# Patient Record
Sex: Female | Born: 1935 | Race: White | Hispanic: No | Smoking: Former smoker
Health system: Southern US, Community
[De-identification: ages and names within clinical notes are randomized; demographics above are authoritative.]

## PROBLEM LIST (undated history)

## (undated) DIAGNOSIS — H04129 Dry eye syndrome of unspecified lacrimal gland: Secondary | ICD-10-CM

## (undated) DIAGNOSIS — D649 Anemia, unspecified: Secondary | ICD-10-CM

## (undated) DIAGNOSIS — E785 Hyperlipidemia, unspecified: Secondary | ICD-10-CM

## (undated) DIAGNOSIS — H409 Unspecified glaucoma: Secondary | ICD-10-CM

## (undated) DIAGNOSIS — H3530 Unspecified macular degeneration: Secondary | ICD-10-CM

## (undated) DIAGNOSIS — I639 Cerebral infarction, unspecified: Secondary | ICD-10-CM

## (undated) DIAGNOSIS — K625 Hemorrhage of anus and rectum: Secondary | ICD-10-CM

## (undated) DIAGNOSIS — H4010X0 Unspecified open-angle glaucoma, stage unspecified: Secondary | ICD-10-CM

## (undated) DIAGNOSIS — R269 Unspecified abnormalities of gait and mobility: Secondary | ICD-10-CM

## (undated) DIAGNOSIS — F419 Anxiety disorder, unspecified: Secondary | ICD-10-CM

## (undated) DIAGNOSIS — H8110 Benign paroxysmal vertigo, unspecified ear: Secondary | ICD-10-CM

## (undated) DIAGNOSIS — M810 Age-related osteoporosis without current pathological fracture: Secondary | ICD-10-CM

## (undated) DIAGNOSIS — F0390 Unspecified dementia without behavioral disturbance: Secondary | ICD-10-CM

## (undated) DIAGNOSIS — M542 Cervicalgia: Secondary | ICD-10-CM

## (undated) DIAGNOSIS — S81801A Unspecified open wound, right lower leg, initial encounter: Secondary | ICD-10-CM

## (undated) DIAGNOSIS — M545 Low back pain, unspecified: Secondary | ICD-10-CM

## (undated) DIAGNOSIS — I4891 Unspecified atrial fibrillation: Secondary | ICD-10-CM

## (undated) DIAGNOSIS — E039 Hypothyroidism, unspecified: Secondary | ICD-10-CM

## (undated) DIAGNOSIS — R197 Diarrhea, unspecified: Secondary | ICD-10-CM

## (undated) DIAGNOSIS — I6310 Cerebral infarction due to embolism of unspecified precerebral artery: Secondary | ICD-10-CM

## (undated) DIAGNOSIS — R1310 Dysphagia, unspecified: Secondary | ICD-10-CM

## (undated) DIAGNOSIS — I482 Chronic atrial fibrillation, unspecified: Secondary | ICD-10-CM

## (undated) DIAGNOSIS — J189 Pneumonia, unspecified organism: Secondary | ICD-10-CM

## (undated) DIAGNOSIS — F411 Generalized anxiety disorder: Secondary | ICD-10-CM

## (undated) DIAGNOSIS — Z7901 Long term (current) use of anticoagulants: Secondary | ICD-10-CM

## (undated) DIAGNOSIS — I10 Essential (primary) hypertension: Secondary | ICD-10-CM

## (undated) DIAGNOSIS — I872 Venous insufficiency (chronic) (peripheral): Secondary | ICD-10-CM

## (undated) DIAGNOSIS — E559 Vitamin D deficiency, unspecified: Secondary | ICD-10-CM

## (undated) DIAGNOSIS — M1990 Unspecified osteoarthritis, unspecified site: Secondary | ICD-10-CM

## (undated) DIAGNOSIS — N8111 Cystocele, midline: Secondary | ICD-10-CM

## (undated) DIAGNOSIS — E782 Mixed hyperlipidemia: Secondary | ICD-10-CM

## (undated) DIAGNOSIS — D500 Iron deficiency anemia secondary to blood loss (chronic): Secondary | ICD-10-CM

## (undated) DIAGNOSIS — Z9181 History of falling: Principal | ICD-10-CM

## (undated) DIAGNOSIS — S20219A Contusion of unspecified front wall of thorax, initial encounter: Secondary | ICD-10-CM

## (undated) DIAGNOSIS — R5381 Other malaise: Secondary | ICD-10-CM

## (undated) HISTORY — DX: Vitamin D deficiency, unspecified: E55.9

## (undated) HISTORY — DX: Unspecified osteoarthritis, unspecified site: M19.90

## (undated) HISTORY — DX: Unspecified macular degeneration: H35.30

## (undated) HISTORY — DX: Generalized anxiety disorder: F41.1

## (undated) HISTORY — DX: Anemia, unspecified: D64.9

## (undated) HISTORY — PX: UMBILICAL HERNIA REPAIR: SHX196

## (undated) HISTORY — PX: SPINE SURGERY: SHX786

## (undated) HISTORY — DX: Dry eye syndrome of unspecified lacrimal gland: H04.129

## (undated) HISTORY — DX: Hypothyroidism, unspecified: E03.9

## (undated) HISTORY — DX: Unspecified dementia, unspecified severity, without behavioral disturbance, psychotic disturbance, mood disturbance, and anxiety: F03.90

## (undated) HISTORY — DX: Pneumonia, unspecified organism: J18.9

## (undated) HISTORY — DX: Age-related osteoporosis without current pathological fracture: M81.0

## (undated) HISTORY — DX: Unspecified glaucoma: H40.9

## (undated) HISTORY — DX: Venous insufficiency (chronic) (peripheral): I87.2

## (undated) HISTORY — DX: Hyperlipidemia, unspecified: E78.5

## (undated) HISTORY — DX: Unspecified atrial fibrillation: I48.91

## (undated) HISTORY — DX: Long term (current) use of anticoagulants: Z79.01

## (undated) HISTORY — DX: Other malaise: R53.81

## (undated) HISTORY — DX: Unspecified open wound, right lower leg, initial encounter: S81.801A

## (undated) HISTORY — DX: History of falling: Z91.81

## (undated) HISTORY — DX: Benign paroxysmal vertigo, unspecified ear: H81.10

## (undated) HISTORY — PX: TONSILLECTOMY AND ADENOIDECTOMY: SUR1326

## (undated) HISTORY — PX: CATARACT EXTRACTION W/ INTRAOCULAR LENS  IMPLANT, BILATERAL: SHX1307

## (undated) HISTORY — DX: Contusion of unspecified front wall of thorax, initial encounter: S20.219A

## (undated) HISTORY — DX: Cystocele, midline: N81.11

## (undated) HISTORY — DX: Cerebral infarction, unspecified: I63.9

---

## 1997-11-27 ENCOUNTER — Other Ambulatory Visit: Admission: RE | Admit: 1997-11-27 | Discharge: 1997-11-27 | Payer: Self-pay | Admitting: Internal Medicine

## 1998-11-19 ENCOUNTER — Encounter: Admission: RE | Admit: 1998-11-19 | Discharge: 1999-02-17 | Payer: Self-pay | Admitting: Anesthesiology

## 1999-04-21 ENCOUNTER — Encounter: Payer: Self-pay | Admitting: Neurosurgery

## 1999-04-24 ENCOUNTER — Encounter: Payer: Self-pay | Admitting: Neurosurgery

## 1999-04-24 ENCOUNTER — Observation Stay (HOSPITAL_COMMUNITY): Admission: RE | Admit: 1999-04-24 | Discharge: 1999-04-25 | Payer: Self-pay | Admitting: Neurosurgery

## 2002-05-19 ENCOUNTER — Ambulatory Visit (HOSPITAL_BASED_OUTPATIENT_CLINIC_OR_DEPARTMENT_OTHER): Admission: RE | Admit: 2002-05-19 | Discharge: 2002-05-19 | Payer: Self-pay | Admitting: Surgery

## 2002-09-25 ENCOUNTER — Encounter: Admission: RE | Admit: 2002-09-25 | Discharge: 2002-09-25 | Payer: Self-pay | Admitting: Surgery

## 2002-09-25 ENCOUNTER — Encounter: Payer: Self-pay | Admitting: Surgery

## 2002-09-28 ENCOUNTER — Ambulatory Visit (HOSPITAL_BASED_OUTPATIENT_CLINIC_OR_DEPARTMENT_OTHER): Admission: RE | Admit: 2002-09-28 | Discharge: 2002-09-28 | Payer: Self-pay | Admitting: Surgery

## 2003-07-11 ENCOUNTER — Encounter (INDEPENDENT_AMBULATORY_CARE_PROVIDER_SITE_OTHER): Payer: Self-pay

## 2003-07-11 ENCOUNTER — Ambulatory Visit (HOSPITAL_COMMUNITY): Admission: RE | Admit: 2003-07-11 | Discharge: 2003-07-11 | Payer: Self-pay | Admitting: Gastroenterology

## 2007-05-27 ENCOUNTER — Inpatient Hospital Stay (HOSPITAL_COMMUNITY): Admission: EM | Admit: 2007-05-27 | Discharge: 2007-05-29 | Payer: Self-pay | Admitting: Emergency Medicine

## 2007-08-11 DIAGNOSIS — I4891 Unspecified atrial fibrillation: Secondary | ICD-10-CM

## 2007-08-11 HISTORY — DX: Unspecified atrial fibrillation: I48.91

## 2008-02-21 ENCOUNTER — Emergency Department (HOSPITAL_COMMUNITY): Admission: EM | Admit: 2008-02-21 | Discharge: 2008-02-22 | Payer: Self-pay | Admitting: Emergency Medicine

## 2009-10-15 ENCOUNTER — Encounter: Admission: RE | Admit: 2009-10-15 | Discharge: 2009-10-15 | Payer: Self-pay | Admitting: Family Medicine

## 2010-02-05 ENCOUNTER — Ambulatory Visit: Payer: Self-pay | Admitting: Psychology

## 2010-09-25 ENCOUNTER — Ambulatory Visit: Payer: Self-pay | Admitting: Physical Therapy

## 2010-09-30 ENCOUNTER — Ambulatory Visit: Payer: Medicare PPO | Attending: Family Medicine | Admitting: Rehabilitative and Restorative Service Providers"

## 2010-09-30 DIAGNOSIS — IMO0001 Reserved for inherently not codable concepts without codable children: Secondary | ICD-10-CM | POA: Insufficient documentation

## 2010-09-30 DIAGNOSIS — M25519 Pain in unspecified shoulder: Secondary | ICD-10-CM | POA: Insufficient documentation

## 2010-09-30 DIAGNOSIS — M25619 Stiffness of unspecified shoulder, not elsewhere classified: Secondary | ICD-10-CM | POA: Insufficient documentation

## 2010-10-06 ENCOUNTER — Ambulatory Visit: Payer: Medicare PPO | Admitting: Physical Therapy

## 2010-10-08 ENCOUNTER — Ambulatory Visit: Payer: Medicare PPO | Admitting: Physical Therapy

## 2010-10-13 ENCOUNTER — Encounter: Payer: Medicare PPO | Admitting: Physical Therapy

## 2010-10-15 ENCOUNTER — Encounter: Payer: Self-pay | Admitting: Rehabilitative and Restorative Service Providers"

## 2010-10-20 ENCOUNTER — Encounter: Payer: Self-pay | Admitting: Rehabilitative and Restorative Service Providers"

## 2010-10-20 ENCOUNTER — Ambulatory Visit: Payer: Medicare PPO | Attending: Family Medicine | Admitting: Physical Therapy

## 2010-10-20 DIAGNOSIS — M25519 Pain in unspecified shoulder: Secondary | ICD-10-CM | POA: Insufficient documentation

## 2010-10-20 DIAGNOSIS — IMO0001 Reserved for inherently not codable concepts without codable children: Secondary | ICD-10-CM | POA: Insufficient documentation

## 2010-10-20 DIAGNOSIS — M25619 Stiffness of unspecified shoulder, not elsewhere classified: Secondary | ICD-10-CM | POA: Insufficient documentation

## 2010-10-22 ENCOUNTER — Ambulatory Visit: Payer: Medicare PPO | Admitting: Rehabilitative and Restorative Service Providers"

## 2010-10-27 ENCOUNTER — Other Ambulatory Visit: Payer: Self-pay | Admitting: Obstetrics and Gynecology

## 2010-10-27 DIAGNOSIS — N6311 Unspecified lump in the right breast, upper outer quadrant: Secondary | ICD-10-CM

## 2010-10-28 ENCOUNTER — Encounter: Payer: Medicare PPO | Admitting: Rehabilitative and Restorative Service Providers"

## 2010-10-29 ENCOUNTER — Ambulatory Visit: Payer: Medicare PPO | Admitting: Rehabilitative and Restorative Service Providers"

## 2010-10-31 ENCOUNTER — Other Ambulatory Visit: Payer: Medicare PPO

## 2010-11-05 ENCOUNTER — Encounter: Payer: Medicare PPO | Admitting: Rehabilitative and Restorative Service Providers"

## 2010-12-23 NOTE — Consult Note (Signed)
Barbara [name], Barbara [name]                 ACCOUNT NO.:  [id number]   MEDICAL RECORD NO.:  [id number]          PATIENT TYPE:  INP   LOCATION:  1825                         FACILITY:  [facility]   PHYSICIAN:  Jake Bathe, MD      DATE OF BIRTH:  03-11-1929   DATE OF CONSULTATION:  05/27/2007  DATE OF DISCHARGE:                                 CONSULTATION   REFERRING PHYSICIAN:  Tera Mater. Evlyn Kanner, M.D., taking call for [facility].   PRIMARY CARE PHYSICIAN:  Gaspar Garbe, M.D.   REASON FOR CONSULTATION:  Atrial fibrillation with rapid ventricular  rate.   HISTORY OF PRESENT ILLNESS:  Barbara [name] is a 77 year old female with no  prior cardiac history with a history of hyperlipidemia and  hypothyroidism on Synthroid, who was admitted to [facility] emergency  department with atrial fibrillation, rapid ventricular response.  She  noted profound weakness today and palpitations.  Was slightly dizzy with  no syncope.  Denies any chest pain or significant shortness of breath.  She felt as though she was losing her balance, however.   On Wednesday, she did receive a flu vaccination in her left deltoid and  since then has felt intense pain in the deltoid region and redness and  soreness.  She felt hot and cold yesterday and felt feverish, although  when her daughter took her temperature, she was not febrile.   Her son at age 50 also has atrial fibrillation and notes that over the  past month or so, she has been complaining of palpitations and irregular  heartbeat, so this atrial fibrillation may have been present for some  time.  She did travel to [place] in January earlier this year for three  weeks and has not had any illnesses since then.  Denies any overt fevers  but she has had chills.  Denies any cough.  She does have left upper arm  pain and redness in the area of the injection.  She does have dizziness.  No abdominal discomfort, however, had urgency for having a bowel  movement  earlier today.  No dysuria.  No orthopnea or PND.   PAST MEDICAL HISTORY:  1. Hypothyroidism, Synthroid.  2. Osteoarthritis.  3. Hyperlipidemia, on niacin.   PAST SURGICAL HISTORY:  She had a spinal stenosis surgery a decade ago.   ALLERGIES:  No known drug allergies.   MEDICATIONS:  1. Niacin.  2. Synthroid 0.1 mg once daily.  3. Special arthritis medicine.  4. Multivitamin, calcium.   She does not take aspirin secondary to easy bruising.   FAMILY HISTORY:  Noncontributory.   SOCIAL HISTORY:  Quit tobacco 40 years ago.  Denies any alcohol use or  illicit drug use or over-the-counter cold medicine use.   REVIEW OF SYSTEMS:  Unless specified above, all other 12 review of  systems negative.   PHYSICAL EXAMINATION:  Heart rate upon arrival was in the 150s, in  atrial fibrillation, now in the 90s and sinus rhythm.  Blood pressure  104/72, and she has always had a low-normal blood pressure.  Her  respiration rate is 20, satting at 100% on room air.  Her temperature is  97.8.  GENERAL:  Alert and oriented x3 in no acute distress.  Pleasant, lying  in bed.  Here with her daughter and son.  HEENT:  Eyes:  Well perfused conjunctivae.  EOMI.  Nonicteric sclerae.  NECK:  Supple.  No thyromegaly.  No carotid bruits heard.  No JVD.  CARDIOVASCULAR:  Regular rate and rhythm with a positive S4.  No  appreciable murmurs or rubs.  LUNGS:  Clear to auscultation bilaterally with normal respiratory  effort.  No crackles.  ABDOMEN:  Soft and nontender.  Normoactive bowel sounds.  No bruits.  No  appreciable hepatosplenomegaly.  EXTREMITIES:  No clubbing, cyanosis or edema.  Normal 2+ pulses  distally.  NEUROLOGIC:  Nonfocal.  Normal reflexes.  Not hyperreflexic.  SKIN:  Left deltoid region is warm, tender to palpation.  There is  noticeable erythema.  No open sores.  Moist mucous membranes.   DATA:  On arrival, atrial fibrillation 153 beats per minute.  Prior ECG  showed sinus rhythm,  rate 71.  No other overt abnormalities.   Chest x-ray pending.   CBC positive for white blood cell count of 25,000, hematocrit of 38, and  platelet count of 196.  Thyroid functions pending.   ASSESSMENT/PLAN:  A 77 year old female with hyperlipidemia,  leukocytosis, hypothyroidism, with pain in her deltoid region after an  injection on Wednesday with paroxysmal atrial fibrillation of unknown  duration, likely greater than one month.  1. Atrial fibrillation, paroxysmal:  Currently normal sinus rhythm      with rates in the 90s.  Would like to place her on Diltiazem and      will do so with drip of 5 mg/hr and can titrate up for heart rates      less than 80.  Watch her blood pressure.  Will give her normal      saline at 75 cc/hr.  Will check an echocardiogram in the morning      for any structural abnormalities.  Her troponin level was slightly      elevated; however, this is likely secondary to the rapid      ventricular response.  Once stable, may benefit from stress test in      the future.  Will decide as an outpatient.  Currently denies any      chest pain.  After discussion of stroke risk, I will initiate      Coumadin 5 mg tonight.  She has no significant bleeding problems.      She does have easy bruising with aspirin.  She should also have an      aspirin 81 mg, given her age, and for cardioprotection.  2. Left deltoid soreness:  Possible cellulitis after injection or this      could be a normal reaction after her flu and Pneumovax.  Given her      leukocytosis, would worry for this and may be necessary to place on      antibiotic therapy.  Will obtain blood cultures and urine culture.  3. Hypothyroidism:  Will check a TSH, free T4, free T3.  4. Hyperlipidemia:  On niacin.  May resume.  Unable to tolerate      statins.   Will follow along with you and as an outpatient.      Jake Bathe, MD  Electronically Signed     MCS/MEDQ  D:  05/27/2007  T:  05/27/2007  Job:   [id number]   cc:   Tera Mater. Evlyn Kanner, M.D.  Gaspar Garbe, M.D.

## 2010-12-23 NOTE — H&P (Signed)
Barbara [name], Barbara [name]                 ACCOUNT NO.:  [id number]   MEDICAL RECORD NO.:  [id number]          PATIENT TYPE:  INP   LOCATION:  1825                         FACILITY:  [facility]   PHYSICIAN:  Tera Mater. Evlyn Kanner, M.D. DATE OF BIRTH:  [birth year]   DATE OF ADMISSION:  05/27/2007  DATE OF DISCHARGE:                              HISTORY & PHYSICAL   Barbara [name] is a 77 year old white female with relatively benign medical  history other than spinal stenosis and hypothyroidism who presents for  evaluation.  She was seen day or two ago in our office and was given a  flu shot and Pneumovax in the left arm.  She had fevers and chills later  that day and today was too weak to get out of bed.  The family wondered  if this was related to her flu shot and called me.  Due to her weakness,  she was brought emergency room and was actually found to be in atrial  fibrillation with rapid response.  The patient does note that she has  had palpitations on and off to a minor degree over the last 3-4 weeks.  Her normal routine of daily exercise had been somewhat interruptive by  recent cataract surgery.  At the present time she has been converted  back to sinus rhythm on a diltiazem drip and is feeling somewhat better.  She did feel slightly short of breath and weak when she was in atrial  fibrillation.  At the present she notes no headaches, no dizziness, no  focal weakness, no breathing trouble, no chest pain.  Her bowels have  been working well.  She has no joint pains.  She has no other  complaints.   PAST MEDICAL HISTORY:  Includes cataract surgery recently.  She has had  spinal stenosis surgery about a decade ago.  She has primary  hypothyroidism which has a reduced dose.  She has some osteoarthritis.   SOCIAL HISTORY:  Reveals that she smoked many, many years ago.   MEDICATIONS:  1. Niacin for hyperlipidemia.  2. Synthroid 100 mcg.  3. She takes multivitamins and calcium.   EXAMINATION:  We  have a healthy, youthful-appearing white female lying  quietly in bed, no distress.  She is lying nearly at 30 degrees with no  dyspnea.  Blood pressure initially was 104/72 with a pulse of 150,  respirations 27, temperature 97.8.  After conversion, blood pressure is  106/59 with pulse of 100.  Sclerae anicteric.  Face is symmetrical.  Oral mucous membranes are moist.  NECK:  Supple.  No bruits are heard.  No thyromegaly is present.  LUNGS: Clear without wheezes, rales, rhonchi.  No accessory muscle in  use.  HEART:  Regular, somewhat fast, with no murmurs appreciated.  ABDOMEN:  Soft, nontender.  No liver edge could be felt.  EXTREMITIES:  Reveal strong distal pulses with no edema.  The patient is awake, alert.  Mentation is good.  Speech is clear.  No  resting tremors present.   LABORATORY DATA:  Reveals a white count of 25,000; hemoglobin 13;  platelets  196,000.  Her BMET is still pending.  Thyroid function testing  is pending.  An EKG was atrial fibrillation without ischemic changes.   In summary, we have a 77-year white female presenting with rapid atrial  fibrillation with weakness.  At the present time she is converted with  diltiazem and is doing better.  She has been seen by cardiology and  their note includes many of these details.  The abnormal elevation of  the white count is interesting in that she did have this chills and  sweats.  I am worried about an infectious process that may have tipped  her over into atrial fibrillation.  The left arm is somewhat red but not  really hot or swollen to a degree that would suggest a raging  cellulitis.  At the present time we are going to cover her empirically  with ciprofloxacin, assuming that GI or GU sources are most likely as  sources of infection.  We will follow and recheck her white count  tomorrow as well as liver function testing.  We may or may not need to  do other imaging.  There is no evidence of any CNS infection, any   pulmonary infection or any other skin infection at the present time.  The patient will be anticoagulated for the atrial fibrillation.  Echocardiogram is pending.  Other tests will be as needed.  Thyroid  function testing, of course, is ordered.           ______________________________  Tera Mater Evlyn Kanner, M.D.     SAS/MEDQ  D:  05/27/2007  T:  05/30/2007  Job:  [id number]

## 2010-12-23 NOTE — Discharge Summary (Signed)
NAMESHERALEE, QAZI                 ACCOUNT NO.:  [id number]   MEDICAL RECORD NO.:  [id number]          PATIENT TYPE:  INP   LOCATION:  3714                         FACILITY:  [facility]   PHYSICIAN:  Tera Mater. Evlyn Kanner, M.D. DATE OF BIRTH:  [birth year]   DATE OF ADMISSION:  05/27/2007  DATE OF DISCHARGE:  05/29/2007                               DISCHARGE SUMMARY   DISCHARGE DIAGNOSES:  1. Rapid atrial fibrillation with rapid ventricular response,      clinically resolved with good rate control.  2. Abnormal troponins with normal CPKs with planned outpatient      evaluation.  3. Urinary tract infection with leukocytosis, clinically improving.  4. History of hyperlipidemia.  5. Hypothyroidism with mild oral replacement.  6. Normocytic anemia.  7. Hyponatremia, resolved.   Ms. Rutt is a 77 year old white female who presented to me on May 27, 2007 with weakness and new onset atrial fibrillation.  She converted  with IV Diltiazem and was converted to p.o. Diltiazem.  She also had a  marked leukocytosis as an interesting finding with presentation.  She  received ciprofloxacin for presumed UTI, and this has trended downward.  The patient has been afebrile while here.  Her rhythm has remained  without difficulty.  Her O2 saturations have been fine, her blood  pressure has been fine, and she has felt generally well.  Her discharge  was delayed somewhat due to the abnormal troponins, and we were to  waiting to see if we had to do acute cardiac workup or if this could be  done as an outpatient.  At the present time, the patient has no active  complaints.  Her blood pressure this morning was 100/60, pulse was 70 in  a sinus rhythm, respirations 20, temperature 98.2, O2 saturation 96%.   LABORATORY DATA:  Lab data revealed a PTT this morning of 46.  Pro time  is subtherapeutic at 1.2.  BMET shows a sodium of 137, potassium 3.4,  chloride 108, CO2 of 23, BUN 5, creatinine 0.65, glucose of 95,  calcium  of 8.1.  This morning's white count is 14,300, hemoglobin 10.9, MCV is  97, platelets 163,000.  Troponin - the last one yesterday was 0.50;  before that 0.73; before that 0.23.  CK yesterday was 90 with an MB of  5.5.  Prior CK was 97 with 8.3 and initial was 99 with an MB of 4.9.  Free T4 was 1.61 with a TSH of 0.390.  Yesterday, white count was 26,400  with a hemoglobin of 11.9, platelets of 198,000.  Lipid profile showed a  total cholesterol of 129, triglycerides 35, HDL 65, LDL of 57.  At  presentation, sodium was 129, potassium 3.7, chloride 97, CO2 of 25, BUN  10, creatinine 0.86, glucose 121.  Urinalysis specific gravity 1.020, pH  5.5, ketones 15 mg percent, small leukocytes, 7-10 white cells seen.  Initial INR was 1.2.  Radiology testing reveals a chest x-ray that  showed no active cardiopulmonary disease.   In summary, we have a 77 year old white female presenting with new  atrial fibrillation  with elevated cardiac enzymes indicating subacute  issues.  Her CKs remained perfectly normal during this hospitalization.  She converted, now is controlled with Diltiazem, and will be maintained  on Coumadin at least in the short term.  She initially was slightly over  replaced on her thyroid and had a leukocytosis due to UTI that is  improving.   MEDICATIONS AT DISCHARGE:  1. Synthroid 100 mcg, 6-1/2 pills a week.  2. Niacin as before.  3. Diltiazem XR 240 once daily.  4. Coumadin 5 mg once daily with a check at [facility] on      Tuesday.  5. Cipro 500 twice a day to finish five more days of the course.  6. Aspirin daily.   She is to call if she has difficulties.  Her diet is regular.  No wound  care is necessary.           ______________________________  Tera Mater Evlyn Kanner, M.D.     SAS/MEDQ  D:  05/29/2007  T:  05/30/2007  Job:  [id number]

## 2010-12-23 NOTE — Consult Note (Signed)
[name], [name] NO.:  [id number]   MEDICAL RECORD NO.:  [id number]          PATIENT TYPE:  EMS   LOCATION:  MAJO                         FACILITY:  [facility]   PHYSICIAN:  Vernice Jefferson, MD          DATE OF BIRTH:  09/25/1928   DATE OF CONSULTATION:  02/21/2008  DATE OF DISCHARGE:                                 CONSULTATION   CARDIOLOGIST:  Jake Bathe, MD   REASON FOR CONSULTATION:  This patient is being seen at the request of  Dr. Patrica Duel for evaluation of arrhythmia.   HISTORY OF PRESENT ILLNESS:  The patient is a 77 year old white female  with history of paroxysmal atrial fibrillation, hyperlipidemia, and  hypothyroidism, who comes in after completing her Holter monitor that  was placed, noticed heart rates in the 160s.  The patient reports it  occurred approximately at 9:15 this evening.  She was asymptomatic at  that time, had no chest pain, had no chest pressure, and felt some mild  shortness of breath, but not unusual for her.  While in the ED it was  noticed, her EKG demonstrated atrial fibrillation with a rapid  ventricular response in the rates of 160.  She has since slowed down  with 2 doses of 10 mg of diltiazem IV.  Currently, her rates in the 90s  and she has no complaints.  She has an appointment with Dr. Anne Fu in  the morning at 10:30 to further evaluate her paroxysmal atrial  fibrillation treatment options.   PAST MEDICAL HISTORY:  1. Paroxysmal atrial fibrillation.  2. Hyperlipidemia.  3. Hypothyroidism.   MEDICATIONS:  1. Synthroid 100 mcg once a day.  2. Niacin 500 mg b.i.d..  3. Aspirin 81 mg once a day.  4. Coumadin.  5. Diltiazem 240 mg XR.   SOCIAL HISTORY:  Nonsmoker, nondrinker, and non-IV drug user.   FAMILY HISTORY:  Reviewed and noncontributory to the patient's current  medical condition.   ALLERGIES:  No known drug allergies.   REVIEW OF SYSTEMS:  Negative for review systems except for those as  dictated in the  above HPI.   PHYSICAL EXAM:  VITAL SIGNS:  Blood pressure 109/66 and heart rate 90s,  irregular.  GENERAL:  A well-developed, well-nourished elderly female, in no acute  distress.  HEENT:  Moist mucous membranes.  Sclerae anicteric.  No conjunctival  pallor.  NECK:  Full range of motion.  No jugular venous distention.  CARDIOVASCULAR:  Irregularly irregular, but no rubs, murmurs, or  gallops.  CHEST:  Clear to auscultation bilaterally.  No wheezes, rales, or  rhonchi.  ABDOMEN:  Soft, nontender, and nondistended.  Normoactive bowel sounds.  EXTREMITIES:  No peripheral edema.  Pulses are 2+ bilaterally.  NEURO:  Nonfocal.   Chest x-ray was not performed.  EKG demonstrates atrial fibrillation  with a ventricular rate of 136 and nonspecific ST- and T-wave  abnormality that is unchanged from her prior ECG.   LABORATORY DATA:  Her laboratory data is noted to have a BUN and  creatinine of 24 and 1.0.  White count 8.4, hemoglobin 13.7, and  platelets of 223.  Biomarkers are negative.  Her INR is 1.4.  Chest x-  ray demonstrates no acute cardiopulmonary disease.   IMPRESSION:  1. Paroxysmal atrial fibrillation, now rate controlled.  2. Hypothyroidism.  3. Hyperlipidemia.   PLAN:  I think this patient can safely be discharged to home.  She does  have an appointment with Dr. Anne Fu in the morning at 10:30.  We would  recommend giving her an extra oral dose of diltiazem of 60 mg short  acting and to continue her 240 mg a day, she takes in the morning.  Her  blood pressure and heart rate are stable and has no symptoms at this  point.      Vernice Jefferson, MD  Electronically Signed     JT/MEDQ  D:  02/21/2008  T:  02/22/2008  Job:  [id number]

## 2010-12-26 NOTE — Op Note (Signed)
   NAME:  CHAPEL, SILVERTHORN                      ACCOUNT NO.:  [id number]   MEDICAL RECORD NO.:  [id number]                   PATIENT TYPE:  AMB   LOCATION:  DSC                                  FACILITY:  [facility]   PHYSICIAN:  Sandria Bales. Ezzard Standing, MD                 DATE OF BIRTH:  November 22, 1928   DATE OF PROCEDURE:  05/19/2002  DATE OF DISCHARGE:                                 OPERATIVE REPORT   PREOPERATIVE DIAGNOSIS:  Umbilical hernia.   POSTOPERATIVE DIAGNOSIS:  Umbilical hernia with about a 3-4 cm diameter sac  with about a 1.5 cm fascial defect.   PROCEDURE:  Open repair of umbilical hernia.   SURGEON:  Sandria Bales. Ezzard Standing, MD   FIRST ASSISTANT:  None.   ANESTHESIA:  General LMA with about 15 cc of 0.25% Marcaine with  epinephrine.   COMPLICATIONS:  None.   INDICATIONS FOR PROCEDURE:  Ms. Karnik is a 77 year old white female, who  has had an umbilical hernia for at least 30 years.  This hernia has slowly  gotten larger and causing more discomfort.  She now comes for repair of this  hernia.   OPERATIVE NOTE:  The patient placed in a supine position, given a general  LMA anesthesia.  Her abdomen was prepped with Betadine solution and  sterilely draped.  A small infraumbilical incision was made with sharp  dissection carried down to the fascia.  The patient had a sac which probably  was 3-4 cm in diameter which is a pretty good hernia sac.  I was able to  reduce this back into the peritoneal cavity.  This left about a 1.5 cm  fascial defect.   I freshened up the edges of the hernia then closed it with interrupted #1  Novofil suture, inverting the inside sutures.   When this was completely closed, I then infiltrated the skin and fascial  spaces with 0.25% Marcaine, using about 15 cc with epinephrine.  I then  tacked down the skin to the fascia to recreate an umbilicus.  The  subcutaneous tissue was closed with 3-0 Vicryl suture, and the skin was  closed with a 5-0 Vicryl.  The  skin was painted with tincture of Benzoin and  sterilely dressed.   The patient tolerated the procedure well, was transported to the recovery  room in good condition.  Sponge and needle counts were correct at the end of  the case.                                               Sandria Bales. Ezzard Standing, MD     DHN/MEDQ  D:  05/19/2002  T:  05/19/2002  Job:  [id number]   cc:   Gaspar Garbe, M.D.

## 2010-12-26 NOTE — Op Note (Signed)
NAME:  Barbara [name], Barbara [name]                      ACCOUNT NO.:  [id number]   MEDICAL RECORD NO.:  [id number]                   PATIENT TYPE:  AMB   LOCATION:  ENDO                                 FACILITY:  [facility]   PHYSICIAN:  Bernette Redbird, M.D.                DATE OF BIRTH:  [birth year]   DATE OF PROCEDURE:  07/11/2003  DATE OF DISCHARGE:                                 OPERATIVE REPORT   PROCEDURE:  Colonoscopy with polypectomy.   INDICATIONS FOR PROCEDURE:  A 77 year old female for colon cancer screening,  no worrisome risk factors or symptoms.   FINDINGS:  Medium size polyp snared from the mid colon. Sigmoid  diverticulosis.   DESCRIPTION OF PROCEDURE:  The nature, purpose and risk of the procedure had  been discussed with the patient who provided written consent. The Olympus  adjustable tension pediatric video colonoscope was advanced to the cecum and  pullback was then performed. The quality of the prep was excellent and it is  felt that all areas are well seen.   There was a 1.5 cm x 8 mm sessile polyp on the fold at about 65 cm removed  by snare technique into several pieces after elevating it with injection of  a small amount of normal saline. The fragments were suctioned through the  scope and the site was tattooed with SPOT. Pullback the remainder of the way  around the colon showed just some moderate sigmoid diverticulosis.  Retroflexion in the rectum was unremarkable.   No other polyps were seen and there was no evidence of cancer, colitis or  vascular malformations. The patient tolerated the procedure well and there  were no apparent complications.   IMPRESSION:  Medium sized polyp removed from the mid colon as described  above.   PLAN:  Await pathology. Consider early followup given the sessile character  of this lesion, if it is adenomatous in character.                                               Bernette Redbird, M.D.    RB/MEDQ  D:  07/11/2003  T:   07/12/2003  Job:  [id number]   cc:   Gaspar Garbe, M.D.  [address]  [place]  [place] [address]  Fax: [phone]

## 2010-12-26 NOTE — Op Note (Signed)
NAME:  Barbara [name], Barbara [name]                      ACCOUNT NO.:  [id number]   MEDICAL RECORD NO.:  [id number]                   PATIENT TYPE:  AMB   LOCATION:  DSC                                  FACILITY:  [facility]   PHYSICIAN:  Sandria Bales. Ezzard Standing, M.D.               DATE OF BIRTH:  [birth year]   DATE OF PROCEDURE:  09/28/2002  DATE OF DISCHARGE:                                 OPERATIVE REPORT   PREOPERATIVE DIAGNOSIS:  Right inguinal hernia.   POSTOPERATIVE DIAGNOSIS:  Large indirect right inguinal hernia.   PROCEDURE:  Open right inguinal hernia repair with mesh.   SURGEON:  Sandria Bales. Ezzard Standing, M.D.   ANESTHESIA:  MAC anesthesia with 40 mL of local anesthesia.   COMPLICATIONS:  None.   INDICATION FOR PROCEDURE:  The patient is a 77 year old white female who has  a fairly good-sized right inguinal hernia and now comes for repair of his  hernia.   The indications and procedure were discussed with the patient and also the  potential complications, including but not limited to bleeding, infection,  nerve injury, recurrent hernia.   DESCRIPTION OF PROCEDURE:  The patient was placed in supine position, given  a MAC anesthesia.  I used a mixture of local anesthetic, including 1%  Xylocaine with epinephrine with 0.25% Marcaine with epinephrine 10:1 with  sodium bicarb, and used this as a local anesthetic in the right groin.  I  made my sharp dissection down through the skin to the external oblique  fascia, which I opened, identified the structures over the round ligament.  I resected the round ligament off the pubic tubercle.  Basically the whole  round ligament all the way down to the pubic tubercle was involved with the  hernia, so the hernia probably was 8-10 cm in length, probably about 2 cm in  width, and was an indirect hernia and originated lateral to where the round  ligament came out of the peritoneal cavity.  Because it was fairly thick, I  did not think it would really be worth  trying to resect, and therefore I  imbricated this in concentric loops of 2-0 chromic suture to imbricate the  entire hernia.   So this then reduced the hernia.  The inguinal floor actually looked fairly  intact.  I then laid a piece of Atrium mesh, which was 3 x 6 inches.  I cut  it down to maybe about 2-1/2 x 5-1/2 inches and laid this on the inguinal  floor.  I sewed it medially to the pubic tubercle, inferiorly to the  shelving edge of the inguinal ligament, superiorly to the transversalis  fascia, and then laterally.  This covered the hernia defect and covered the  inguinal floor.   It was sewn in place using 0 Novofil sutures.  The wound was then irrigated,  the external oblique fascia closed with interrupted 3-0 Vicryl sutures, and  then irrigated the subcutaneous  tissues, closed with 3-0 Vicryl sutures and  the skin closed with a 5-0 Monocryl suture, painted with tincture of Benzoin  and Steri-Strips and sterilely dressed.   The patient tolerated the procedure well and will be discharged home today.  Her daughter is with her at the surgical center, and return appointment to  see me in two weeks.                                               Sandria Bales. Ezzard Standing, M.D.    DHN/MEDQ  D:  09/28/2002  T:  09/28/2002  Job:  [id number]   cc:   Gaspar Garbe, M.D.  [address]  [place]  [place] [address]  Fax: [phone]

## 2011-05-07 LAB — POCT I-STAT, CHEM 8
Calcium, Ion: 1.13
Chloride: 105
Glucose, Bld: 117 — ABNORMAL HIGH
HCT: 40
Hemoglobin: 13.6
Potassium: 3.7

## 2011-05-07 LAB — DIFFERENTIAL
Basophils Relative: 0
Eosinophils Absolute: 0.1
Monocytes Relative: 11
Neutrophils Relative %: 55

## 2011-05-07 LAB — POCT CARDIAC MARKERS
CKMB, poc: 3.7
Myoglobin, poc: 135
Troponin i, poc: <0.05

## 2011-05-07 LAB — CBC
MCHC: 35.3
MCV: 96.4
Platelets: 233
RBC: 4.03

## 2011-05-07 LAB — PROTIME-INR
INR: 1.4
Prothrombin Time: 17.6 — ABNORMAL HIGH

## 2011-05-20 LAB — URINALYSIS, ROUTINE W REFLEX MICROSCOPIC
Glucose, UA: NEGATIVE
Protein, ur: NEGATIVE
Specific Gravity, Urine: 1.02
Urobilinogen, UA: 0.2

## 2011-05-20 LAB — T4, FREE: Free T4: 1.61

## 2011-05-20 LAB — BASIC METABOLIC PANEL
BUN: 5 — ABNORMAL LOW
CO2: 23
Calcium: 8.2 — ABNORMAL LOW
Chloride: 108
Creatinine, Ser: 0.65
Creatinine, Ser: 0.86
GFR calc Af Amer: >60
GFR calc non Af Amer: >60
Potassium: 3.4 — ABNORMAL LOW

## 2011-05-20 LAB — CK TOTAL AND CKMB (NOT AT ARMC)
Relative Index: INVALID
Relative Index: INVALID
Relative Index: INVALID
Total CK: 97

## 2011-05-20 LAB — COMPREHENSIVE METABOLIC PANEL
ALT: 27
AST: 39 — ABNORMAL HIGH
Albumin: 3.2 — ABNORMAL LOW
Alkaline Phosphatase: 83
Chloride: 100
GFR calc Af Amer: >60
Potassium: 4.2
Sodium: 135
Total Bilirubin: 1.8 — ABNORMAL HIGH
Total Protein: 6.7

## 2011-05-20 LAB — CBC
HCT: 32.1 — ABNORMAL LOW
HCT: 38.2
MCHC: 33.9
MCHC: 34.7
MCV: 97
Platelets: 163
Platelets: 196
RBC: 3.64 — ABNORMAL LOW
RDW: 13.1
WBC: 25.3 — ABNORMAL HIGH
WBC: 26.4 — ABNORMAL HIGH

## 2011-05-20 LAB — CULTURE, BLOOD (ROUTINE X 2)
Culture: NO GROWTH
Culture: NO GROWTH

## 2011-05-20 LAB — LIPID PANEL
HDL: 65
Total CHOL/HDL Ratio: 2
Triglycerides: 35
VLDL: 7

## 2011-05-20 LAB — PROTIME-INR
Prothrombin Time: 15.3 — ABNORMAL HIGH
Prothrombin Time: 15.6 — ABNORMAL HIGH

## 2011-05-20 LAB — HEPATIC FUNCTION PANEL
Albumin: 2.9 — ABNORMAL LOW
Total Bilirubin: 1.5 — ABNORMAL HIGH
Total Protein: 6.1

## 2011-05-20 LAB — DIFFERENTIAL
Basophils Absolute: 0
Basophils Relative: 0
Eosinophils Absolute: 0
Lymphocytes Relative: 3 — ABNORMAL LOW
Lymphs Abs: 0.8
Monocytes Absolute: 1 — ABNORMAL HIGH
Neutro Abs: 23.5 — ABNORMAL HIGH

## 2011-05-20 LAB — TROPONIN I: Troponin I: 0.73

## 2011-05-20 LAB — URINE CULTURE
Colony Count: NO GROWTH
Culture: NO GROWTH

## 2011-05-20 LAB — URINE MICROSCOPIC-ADD ON

## 2011-05-20 LAB — POCT CARDIAC MARKERS: Myoglobin, poc: 140

## 2011-05-22 ENCOUNTER — Other Ambulatory Visit: Payer: Self-pay | Admitting: Family Medicine

## 2011-05-22 DIAGNOSIS — Z1231 Encounter for screening mammogram for malignant neoplasm of breast: Secondary | ICD-10-CM

## 2011-09-16 ENCOUNTER — Other Ambulatory Visit: Payer: Self-pay | Admitting: Dermatology

## 2012-03-10 DIAGNOSIS — J189 Pneumonia, unspecified organism: Secondary | ICD-10-CM

## 2012-03-10 HISTORY — DX: Pneumonia, unspecified organism: J18.9

## 2012-04-01 DIAGNOSIS — R5381 Other malaise: Secondary | ICD-10-CM

## 2012-04-01 DIAGNOSIS — Z7901 Long term (current) use of anticoagulants: Secondary | ICD-10-CM

## 2012-04-01 HISTORY — DX: Other malaise: R53.81

## 2012-04-01 HISTORY — DX: Long term (current) use of anticoagulants: Z79.01

## 2012-04-19 ENCOUNTER — Ambulatory Visit (HOSPITAL_COMMUNITY)
Admission: RE | Admit: 2012-04-19 | Discharge: 2012-04-19 | Disposition: A | Discharge status: Home / Self Care | Payer: Medicare PPO | Source: Ambulatory Visit | Attending: Internal Medicine | Admitting: Internal Medicine

## 2012-04-19 DIAGNOSIS — Z8701 Personal history of pneumonia (recurrent): Secondary | ICD-10-CM | POA: Insufficient documentation

## 2012-04-19 DIAGNOSIS — R1310 Dysphagia, unspecified: Secondary | ICD-10-CM | POA: Insufficient documentation

## 2012-04-19 DIAGNOSIS — R05 Cough: Secondary | ICD-10-CM | POA: Insufficient documentation

## 2012-04-19 DIAGNOSIS — F0390 Unspecified dementia without behavioral disturbance: Secondary | ICD-10-CM | POA: Insufficient documentation

## 2012-04-19 DIAGNOSIS — Z87891 Personal history of nicotine dependence: Secondary | ICD-10-CM | POA: Insufficient documentation

## 2012-04-19 DIAGNOSIS — E039 Hypothyroidism, unspecified: Secondary | ICD-10-CM | POA: Insufficient documentation

## 2012-04-19 DIAGNOSIS — R059 Cough, unspecified: Secondary | ICD-10-CM | POA: Insufficient documentation

## 2012-04-19 DIAGNOSIS — I4891 Unspecified atrial fibrillation: Secondary | ICD-10-CM | POA: Insufficient documentation

## 2012-04-19 NOTE — Procedures (Signed)
Objective Swallowing Evaluation: Modified Barium Swallowing Study  Patient Details  Name: Barbara [name] MRN: [id number] Date of Birth: 1929-06-16  Today's Date: 04/19/2012 Time: 4098-1191 SLP Time Calculation (min): 36 min  HPI:  78 yo female resident of SNF referred for MBS due to concerns pt may be aspirating.  Pt presents with coughing during meals, wet voice after liquids and recently had pna per SNF referral form.  PMH + for afib, hypothyroidism, hyperlipidemia, dementia, macular degeneration, osteopenia, h/o cigarette smoker quit 45 years ago .  Pt denies problems swallowing and was observed to clear her throat several times prior to barium intake- subtle cough x1 noted.  Pt denies h/o this throat clearing and states she has problems with mucus today.      Assessment / Plan / Recommendation Clinical Impression  Clinical impression: Pt appeared with overall functional swallow without aspiration or penetration of any consistency tested.  Swallow appeared strong and timely and only trace pharyngeal stasis of liquids noted that cleared with both cued and reflexive dry swallows.  Pt was able to adequately orally and pharyngeally transit a barium pill with liquids without difficulties.  At one point pt did cough but unfortunately SLP was reviewing distal esophagus- upon return to larynx- did not view barium in trachea or larynx therefore do not suspect she aspirated.  Pt noted to clear her throat prior to, during and after MBS.    Appearance of prominent cricopharyngeus intermittently observed-did not impact barium flow.  Appearance of minimal distal delayed clearance - liquids appeared to facilitate clearance.  As pt is kyphotic and has prominent cp, suspect she may be an esophageal component to her dysphagia and recommend she consume liquids throughout meal to facilitate clearance.  Pt denies symptoms of heartburn or reflux.     Treatment Recommendation  Defer treatment plan to SLP at  (Comment) [facility] S[facility])    Diet Recommendation Dysphagia 3 (Mechanical Soft);Thin liquid   Liquid Administration via: Cup;Straw Medication Administration: Other (Comment) (as tolerated) Compensations: Small sips/bites;Slow rate (drink liquids throughout meal) Postural Changes and/or Swallow Maneuvers: Upright 30-60 min after meal;Seated upright 90 degrees    Other  Recommendations Oral Care Recommendations: Oral care BID   Follow Up Recommendations    defer to SLP at assisted living          SLP Swallow Goals     General Date of Onset: 04/19/12 HPI: 78 yo female resident of SNF referred for MBS due to concerns pt may be aspirating.  Pt presents with coughing during meals, wet voice after liquids and recently had pna per SNF referral form.  PMH + for afib, hypothyroidism, hyperlipidemia, dementia, macular degeneration, osteopenia, h/o cigarette smoker quit 45 years ago .  Pt denies problems swallowing and was observed to clear her throat several times prior to barium intake- subtle cough x1 noted.  Pt denies h/o this throat clearing and states she has problems with mucus today. .   Type of Study: Modified Barium Swallowing Study Reason for Referral: Objectively evaluate swallowing function Previous Swallow Assessment: none Diet Prior to this Study: Regular;Thin liquids Temperature Spikes Noted: No Respiratory Status: Room air History of Recent Intubation: No Behavior/Cognition: Alert;Cooperative;Pleasant mood Oral Cavity - Dentition: Adequate natural dentition Oral Motor / Sensory Function: Within functional limits Self-Feeding Abilities: Able to feed self Patient Positioning: Upright in chair Baseline Vocal Quality: Clear Volitional Cough: Strong Volitional Swallow: Able to elicit Anatomy: Within functional limits    Reason for Referral Objectively evaluate swallowing  function   Oral Phase   Oral: Impaired- Functional  Pharyngeal Phase Pharyngeal Phase: Impaired-  Functional   Cervical Esophageal Phase    GO Functional Assessment Tool Used: MBS Functional Limitations: Swallowing Swallow Current Status ([id number]): At least 1 percent but less than 20 percent impaired, limited or restricted Swallow Goal Status [phone]): At least 1 percent but less than 20 percent impaired, limited or restricted Swallow Discharge Status [phone]): At least 1 percent but less than 20 percent impaired, limited or restricted  Cervical Esophageal Phase: Impaired- Functional    Donavan Burnet, MS [facility] SLP [phone]

## 2012-08-10 DIAGNOSIS — I639 Cerebral infarction, unspecified: Secondary | ICD-10-CM

## 2012-08-10 DIAGNOSIS — H04129 Dry eye syndrome of unspecified lacrimal gland: Secondary | ICD-10-CM

## 2012-08-10 HISTORY — DX: Cerebral infarction, unspecified: I63.9

## 2012-08-10 HISTORY — DX: Dry eye syndrome of unspecified lacrimal gland: H04.129

## 2012-11-07 ENCOUNTER — Non-Acute Institutional Stay: Payer: Medicare PPO | Admitting: Geriatric Medicine

## 2012-11-07 DIAGNOSIS — K5900 Constipation, unspecified: Secondary | ICD-10-CM

## 2012-11-07 DIAGNOSIS — M549 Dorsalgia, unspecified: Secondary | ICD-10-CM

## 2012-11-08 DIAGNOSIS — D649 Anemia, unspecified: Secondary | ICD-10-CM

## 2012-11-08 HISTORY — DX: Anemia, unspecified: D64.9

## 2012-11-10 ENCOUNTER — Encounter: Payer: Self-pay | Admitting: Geriatric Medicine

## 2012-11-10 DIAGNOSIS — K5900 Constipation, unspecified: Secondary | ICD-10-CM | POA: Insufficient documentation

## 2012-11-10 DIAGNOSIS — I4891 Unspecified atrial fibrillation: Secondary | ICD-10-CM | POA: Insufficient documentation

## 2012-11-10 DIAGNOSIS — M1990 Unspecified osteoarthritis, unspecified site: Secondary | ICD-10-CM | POA: Insufficient documentation

## 2012-11-10 DIAGNOSIS — F411 Generalized anxiety disorder: Secondary | ICD-10-CM | POA: Insufficient documentation

## 2012-11-10 DIAGNOSIS — M549 Dorsalgia, unspecified: Secondary | ICD-10-CM | POA: Insufficient documentation

## 2012-11-10 DIAGNOSIS — F0390 Unspecified dementia without behavioral disturbance: Secondary | ICD-10-CM | POA: Insufficient documentation

## 2012-11-10 NOTE — Progress Notes (Signed)
Patient ID: Barbara [name], female   DOB: [birth year], 79 y.o.   MRN: [id number]    Chief Complaint  Patient presents with  . "pain all over"   HPI  This 79yo female resident of [facility] is evaluated today due to c/o generalized pian, decreased activity and PO intake. Pt. Tells me "I hurt all over..."back, sides, abdomen, lower ribs. No nausea. Minimal BM last several days. MoM given nlast night, very little result, suppository today with results of hard pellets.  Allergies Reviewed   Medications Reviewed   Data Reviewed    Radiology:    Lab([facility], external)  07/05/2012 CBC: Rbc 4.05, Hgb 12.7, Hct 36.6, Platelet 263   CMP: Sodium 140, Potassium 3.9, glucose 167, BUN 17, Creatinine 0.78   TSH 0.085 Vitamin B12  330  08/18/2012 CBC: Wbc 6.4, Rbc 4.02, Hgb 12.3, Hct 35.9, Platelet 285    Other:    REVIEW of SYSTEMS:   DATA OBTAINED: from patient, nurse, medical record,  GENERAL: Does not feel well. No fevers. Decreased activity status, appetite  NOSE: No congestion, drainage or bleeding MOUTH/THROAT: No mouth or tooth pain. No difficulty chewing or swallowing. RESPIRATORY: No cough, wheezing, SOB CARDIAC: No chest pain, palpitations. No edema. GI:  No N/V. Constipation, see HPI.  GU: No dysuria, frequency or urgency.  No change in urine volume or character MUSCULOSKELETAL: Hip stiffness/ pain Back pain.  NEUROLOGIC: No dizziness, fainting, headache. No change in mental status.  PSYCHIATRIC: Anxiety present.  Sleeps well.   PHYSICAL EXAM:  Filed Vitals:   11/10/12 2032  BP: 140/79  Pulse: 95  Temp: 98 F (36.7 C)    GENERAL APPEARANCE: No acute distress, appropriately groomed, normal body habitus. Alert, pleasant, conversant. HEAD: Normocephalic, atraumatic EYES: Conjunctiva/lids clear. Pupils round, reactive. NOSE: No deformity or discharge. MOUTH/THROAT: Lips w/o lesions. Oral mucosa, tongue moist, w/o lesion. Oropharynx w/o redness or lesions.   RESPIRATORY: Breathing is even, unlabored. Lung sounds are clear and full.  CARDIOVASCULAR: Heart RRR. No murmur or extra heart sounds   EDEMA: No peripheral or periorbital edema. No ascites GASTROINTESTINAL: Abdomen w/ mild distention, decreased bowel sounds, tender LLQ, tender costal margins GENITOURINARY: Bladder non tender, not distended. MUSCULOSKELETAL: Moves all extremities with full ROM, strength and tone. Back is without kyphosis, scoliosis or spinal process tenderness. Difficulty rising from sitting or lying down. Gait is stooped, slow, steady  NEUROLOGIC: Not oriented to time,  Speech clear, no tremor.   PSYCHIATRIC: Mood and affect appropriate to situation  ASSESSMENT/PLAN  Back pain Back pain due to OA/ constipation.  Unspecified constipation Constipation likely cause of most of this pt's discomfort. Obtain Abd x-ray to r/o other pathology. Anticipate Fleet's enema then start Miralax tonight   Follow up as needed   Monserat Prestigiacomo T.Donelda Mailhot, NP-C

## 2012-11-10 NOTE — Assessment & Plan Note (Signed)
Constipation likely cause of most of this pt's discomfort. Obtain Abd x-ray to r/o other pathology. Anticipate Fleet's enema then start Miralax tonight

## 2012-11-10 NOTE — Assessment & Plan Note (Signed)
Back pain due to OA/ constipation.

## 2012-11-19 DIAGNOSIS — I639 Cerebral infarction, unspecified: Secondary | ICD-10-CM

## 2012-11-19 HISTORY — DX: Cerebral infarction, unspecified: I63.9

## 2012-11-28 ENCOUNTER — Other Ambulatory Visit (HOSPITAL_COMMUNITY): Payer: Self-pay | Admitting: Internal Medicine

## 2012-11-29 ENCOUNTER — Ambulatory Visit (HOSPITAL_COMMUNITY)
Admission: RE | Admit: 2012-11-29 | Discharge: 2012-11-29 | Disposition: A | Discharge status: Home / Self Care | Payer: Medicare PPO | Source: Ambulatory Visit | Attending: Internal Medicine | Admitting: Internal Medicine

## 2012-11-29 DIAGNOSIS — D649 Anemia, unspecified: Secondary | ICD-10-CM | POA: Insufficient documentation

## 2012-11-29 LAB — ABO/RH: ABO/RH(D): O POS

## 2012-11-29 LAB — PREPARE RBC (CROSSMATCH)

## 2012-11-30 ENCOUNTER — Ambulatory Visit (HOSPITAL_COMMUNITY)
Admission: RE | Admit: 2012-11-30 | Discharge: 2012-11-30 | Disposition: A | Discharge status: Home / Self Care | Payer: Medicare PPO | Source: Ambulatory Visit | Attending: Internal Medicine | Admitting: Internal Medicine

## 2012-11-30 ENCOUNTER — Encounter (HOSPITAL_COMMUNITY): Payer: Self-pay

## 2012-11-30 MED ORDER — SODIUM CHLORIDE 0.9 % IV SOLN
INTRAVENOUS | Status: DC
  Administered 2012-11-30: 09:00:00 via INTRAVENOUS

## 2012-11-30 MED ORDER — FUROSEMIDE 10 MG/ML IJ SOLN
40.0000 mg | Freq: Once | INTRAMUSCULAR | Status: AC
  Administered 2012-11-30: 40 mg via INTRAVENOUS
  Filled 2012-11-30: qty 4

## 2012-11-30 NOTE — Progress Notes (Signed)
Pt discharged back to [facility] with daughter Selena Batten)

## 2012-11-30 NOTE — Progress Notes (Signed)
Pt eating her breakfast and states she feels fine, she is without complaints.  Her daughter, Barbara [name] is with her at this time.

## 2012-11-30 NOTE — Progress Notes (Signed)
Pt tolerated second blood transfusion without complaints.

## 2012-12-01 LAB — TYPE AND SCREEN
ABO/RH(D): O POS
Unit division: 0

## 2012-12-12 ENCOUNTER — Encounter: Payer: Medicare PPO | Admitting: Internal Medicine

## 2012-12-19 ENCOUNTER — Other Ambulatory Visit: Payer: Self-pay | Admitting: Geriatric Medicine

## 2012-12-19 ENCOUNTER — Inpatient Hospital Stay (HOSPITAL_COMMUNITY): Payer: Medicare PPO

## 2012-12-19 ENCOUNTER — Inpatient Hospital Stay (HOSPITAL_COMMUNITY)
Admission: EM | Admit: 2012-12-19 | Discharge: 2012-12-21 | DRG: 065 | Disposition: A | Discharge status: Skilled Nursing Facility | Payer: Medicare PPO | Attending: Internal Medicine | Admitting: Internal Medicine

## 2012-12-19 ENCOUNTER — Non-Acute Institutional Stay: Payer: Medicare PPO | Admitting: Internal Medicine

## 2012-12-19 ENCOUNTER — Emergency Department (HOSPITAL_COMMUNITY): Payer: Medicare PPO

## 2012-12-19 VITALS — BP 100/67 | HR 64 | Temp 97.3°F | Resp 24

## 2012-12-19 DIAGNOSIS — I4891 Unspecified atrial fibrillation: Secondary | ICD-10-CM

## 2012-12-19 DIAGNOSIS — H409 Unspecified glaucoma: Secondary | ICD-10-CM | POA: Diagnosis present

## 2012-12-19 DIAGNOSIS — I639 Cerebral infarction, unspecified: Secondary | ICD-10-CM | POA: Diagnosis present

## 2012-12-19 DIAGNOSIS — R471 Dysarthria and anarthria: Secondary | ICD-10-CM

## 2012-12-19 DIAGNOSIS — K5900 Constipation, unspecified: Secondary | ICD-10-CM

## 2012-12-19 DIAGNOSIS — M549 Dorsalgia, unspecified: Secondary | ICD-10-CM

## 2012-12-19 DIAGNOSIS — F411 Generalized anxiety disorder: Secondary | ICD-10-CM

## 2012-12-19 DIAGNOSIS — I69359 Hemiplegia and hemiparesis following cerebral infarction affecting unspecified side: Secondary | ICD-10-CM

## 2012-12-19 DIAGNOSIS — Z9849 Cataract extraction status, unspecified eye: Secondary | ICD-10-CM

## 2012-12-19 DIAGNOSIS — Z79899 Other long term (current) drug therapy: Secondary | ICD-10-CM

## 2012-12-19 DIAGNOSIS — I6789 Other cerebrovascular disease: Secondary | ICD-10-CM

## 2012-12-19 DIAGNOSIS — H3530 Unspecified macular degeneration: Secondary | ICD-10-CM | POA: Diagnosis present

## 2012-12-19 DIAGNOSIS — Z7901 Long term (current) use of anticoagulants: Secondary | ICD-10-CM

## 2012-12-19 DIAGNOSIS — I6350 Cerebral infarction due to unspecified occlusion or stenosis of unspecified cerebral artery: Principal | ICD-10-CM | POA: Diagnosis present

## 2012-12-19 DIAGNOSIS — M810 Age-related osteoporosis without current pathological fracture: Secondary | ICD-10-CM | POA: Diagnosis present

## 2012-12-19 DIAGNOSIS — E785 Hyperlipidemia, unspecified: Secondary | ICD-10-CM

## 2012-12-19 DIAGNOSIS — R5381 Other malaise: Secondary | ICD-10-CM | POA: Diagnosis present

## 2012-12-19 DIAGNOSIS — F0390 Unspecified dementia without behavioral disturbance: Secondary | ICD-10-CM

## 2012-12-19 DIAGNOSIS — E039 Hypothyroidism, unspecified: Secondary | ICD-10-CM | POA: Diagnosis present

## 2012-12-19 DIAGNOSIS — Z961 Presence of intraocular lens: Secondary | ICD-10-CM

## 2012-12-19 DIAGNOSIS — Z87891 Personal history of nicotine dependence: Secondary | ICD-10-CM

## 2012-12-19 DIAGNOSIS — M1990 Unspecified osteoarthritis, unspecified site: Secondary | ICD-10-CM

## 2012-12-19 DIAGNOSIS — R29810 Facial weakness: Secondary | ICD-10-CM | POA: Diagnosis present

## 2012-12-19 DIAGNOSIS — Z8249 Family history of ischemic heart disease and other diseases of the circulatory system: Secondary | ICD-10-CM

## 2012-12-19 DIAGNOSIS — R4701 Aphasia: Secondary | ICD-10-CM | POA: Diagnosis present

## 2012-12-19 DIAGNOSIS — G8190 Hemiplegia, unspecified affecting unspecified side: Secondary | ICD-10-CM | POA: Diagnosis present

## 2012-12-19 DIAGNOSIS — E559 Vitamin D deficiency, unspecified: Secondary | ICD-10-CM | POA: Diagnosis present

## 2012-12-19 DIAGNOSIS — I69959 Hemiplegia and hemiparesis following unspecified cerebrovascular disease affecting unspecified side: Secondary | ICD-10-CM

## 2012-12-19 LAB — URINALYSIS, ROUTINE W REFLEX MICROSCOPIC
Glucose, UA: NEGATIVE mg/dL
Hgb urine dipstick: NEGATIVE
Protein, ur: NEGATIVE mg/dL
Specific Gravity, Urine: 1.01 (ref 1.005–1.030)
pH: 7.5 (ref 5.0–8.0)

## 2012-12-19 LAB — CBC
HCT: 33.6 % — ABNORMAL LOW (ref 36.0–46.0)
Hemoglobin: 11.1 g/dL — ABNORMAL LOW (ref 12.0–15.0)
MCH: 26.2 pg (ref 26.0–34.0)
MCHC: 33 g/dL (ref 30.0–36.0)

## 2012-12-19 LAB — POCT I-STAT TROPONIN I: Troponin i, poc: 0 ng/mL (ref 0.00–0.08)

## 2012-12-19 LAB — POCT I-STAT, CHEM 8
BUN: 17 mg/dL (ref 6–23)
Chloride: 108 mEq/L (ref 96–112)
HCT: 36 % (ref 36.0–46.0)
Potassium: 4 mEq/L (ref 3.5–5.1)
Sodium: 141 mEq/L (ref 135–145)

## 2012-12-19 LAB — DIFFERENTIAL
Basophils Relative: 0 % (ref 0–1)
Eosinophils Absolute: 0.1 10*3/uL (ref 0.0–0.7)
Monocytes Absolute: 1 10*3/uL (ref 0.1–1.0)
Monocytes Relative: 14 % — ABNORMAL HIGH (ref 3–12)
Neutro Abs: 4 10*3/uL (ref 1.7–7.7)

## 2012-12-19 LAB — RAPID URINE DRUG SCREEN, HOSP PERFORMED
Amphetamines: NOT DETECTED
Opiates: NOT DETECTED

## 2012-12-19 LAB — TROPONIN I: Troponin I: <0.3 ng/mL (ref <0.30)

## 2012-12-19 LAB — GLUCOSE, CAPILLARY: Glucose-Capillary: 94 mg/dL (ref 70–99)

## 2012-12-19 LAB — COMPREHENSIVE METABOLIC PANEL
Albumin: 3.5 g/dL (ref 3.5–5.2)
BUN: 16 mg/dL (ref 6–23)
Chloride: 105 mEq/L (ref 96–112)
Creatinine, Ser: 0.74 mg/dL (ref 0.50–1.10)
GFR calc Af Amer: >90 mL/min (ref >90)
Glucose, Bld: 88 mg/dL (ref 70–99)
Total Bilirubin: 0.3 mg/dL (ref 0.3–1.2)

## 2012-12-19 MED ORDER — PANTOPRAZOLE SODIUM 40 MG PO TBEC
40.0000 mg | DELAYED_RELEASE_TABLET | Freq: Every day | ORAL | Status: DC
  Administered 2012-12-20: 40 mg via ORAL
  Filled 2012-12-19 (×2): qty 1

## 2012-12-19 MED ORDER — ACETAMINOPHEN 325 MG PO TABS
650.0000 mg | ORAL_TABLET | Freq: Two times a day (BID) | ORAL | Status: DC
  Administered 2012-12-19 – 2012-12-21 (×4): 650 mg via ORAL
  Filled 2012-12-19 (×4): qty 2

## 2012-12-19 MED ORDER — CARBOXYMETHYLCELLUL-GLYCERIN 0.5-0.9 % OP SOLN: 1.0000 [drp] | Freq: Three times a day (TID) | OPHTHALMIC | Status: DC

## 2012-12-19 MED ORDER — SODIUM CHLORIDE 0.9 % IJ SOLN
3.0000 mL | Freq: Two times a day (BID) | INTRAMUSCULAR | Status: DC
  Administered 2012-12-19 – 2012-12-20 (×2): 3 mL via INTRAVENOUS

## 2012-12-19 MED ORDER — POLYETHYLENE GLYCOL 3350 17 G PO PACK
17.0000 g | PACK | Freq: Every day | ORAL | Status: DC | PRN
  Filled 2012-12-19: qty 1

## 2012-12-19 MED ORDER — SENNOSIDES-DOCUSATE SODIUM 8.6-50 MG PO TABS: 1.0000 | ORAL_TABLET | Freq: Every evening | ORAL | Status: DC | PRN

## 2012-12-19 MED ORDER — ACETAMINOPHEN 650 MG RE SUPP: 650.0000 mg | Freq: Four times a day (QID) | RECTAL | Status: DC | PRN

## 2012-12-19 MED ORDER — FLEET ENEMA 7-19 GM/118ML RE ENEM: 1.0000 | ENEMA | Freq: Once | RECTAL | Status: AC | PRN

## 2012-12-19 MED ORDER — POLYETHYLENE GLYCOL 3350 17 G PO PACK
17.0000 g | PACK | Freq: Every day | ORAL | Status: DC
Start: 2012-12-20 — End: 2012-12-21
  Filled 2012-12-19 (×2): qty 1

## 2012-12-19 MED ORDER — POLYSACCHARIDE IRON COMPLEX 150 MG PO CAPS
150.0000 mg | ORAL_CAPSULE | Freq: Every day | ORAL | Status: DC
  Administered 2012-12-20 – 2012-12-21 (×2): 150 mg via ORAL
  Filled 2012-12-19 (×2): qty 1

## 2012-12-19 MED ORDER — POLYVINYL ALCOHOL 1.4 % OP SOLN
1.0000 [drp] | Freq: Three times a day (TID) | OPHTHALMIC | Status: DC
  Administered 2012-12-19 – 2012-12-21 (×5): 1 [drp] via OPHTHALMIC
  Filled 2012-12-19: qty 15

## 2012-12-19 MED ORDER — LEVOTHYROXINE SODIUM 100 MCG PO TABS
100.0000 ug | ORAL_TABLET | Freq: Every day | ORAL | Status: DC
  Administered 2012-12-20: 100 ug via ORAL
  Filled 2012-12-19 (×2): qty 1

## 2012-12-19 MED ORDER — SERTRALINE HCL 50 MG PO TABS
50.0000 mg | ORAL_TABLET | Freq: Every day | ORAL | Status: DC
  Administered 2012-12-19 – 2012-12-20 (×2): 50 mg via ORAL
  Filled 2012-12-19 (×3): qty 1

## 2012-12-19 MED ORDER — ONDANSETRON HCL 4 MG/2ML IJ SOLN: 4.0000 mg | Freq: Four times a day (QID) | INTRAMUSCULAR | Status: DC | PRN

## 2012-12-19 MED ORDER — DILTIAZEM HCL ER BEADS 240 MG PO CP24: 360.0000 mg | ORAL_CAPSULE | Freq: Every day | ORAL | Status: DC

## 2012-12-19 MED ORDER — DILTIAZEM HCL ER COATED BEADS 360 MG PO CP24
360.0000 mg | ORAL_CAPSULE | Freq: Every day | ORAL | Status: DC
  Administered 2012-12-20 – 2012-12-21 (×2): 360 mg via ORAL
  Filled 2012-12-19 (×2): qty 1

## 2012-12-19 MED ORDER — ACETAMINOPHEN 325 MG PO TABS: 650.0000 mg | ORAL_TABLET | Freq: Four times a day (QID) | ORAL | Status: DC | PRN

## 2012-12-19 MED ORDER — ALUM & MAG HYDROXIDE-SIMETH 200-200-20 MG/5ML PO SUSP: 30.0000 mL | Freq: Four times a day (QID) | ORAL | Status: DC | PRN

## 2012-12-19 MED ORDER — ONDANSETRON HCL 4 MG PO TABS: 4.0000 mg | ORAL_TABLET | Freq: Four times a day (QID) | ORAL | Status: DC | PRN

## 2012-12-19 MED ORDER — APIXABAN 2.5 MG PO TABS
2.5000 mg | ORAL_TABLET | Freq: Two times a day (BID) | ORAL | Status: DC
  Administered 2012-12-19 – 2012-12-21 (×4): 2.5 mg via ORAL
  Filled 2012-12-19 (×5): qty 1

## 2012-12-19 MED ORDER — DORZOLAMIDE HCL-TIMOLOL MAL 2-0.5 % OP SOLN
1.0000 [drp] | Freq: Two times a day (BID) | OPHTHALMIC | Status: DC
  Administered 2012-12-19 – 2012-12-21 (×4): 1 [drp] via OPHTHALMIC
  Filled 2012-12-19: qty 10

## 2012-12-19 MED ORDER — SODIUM CHLORIDE 0.9 % IV SOLN
INTRAVENOUS | Status: DC
  Administered 2012-12-19 – 2012-12-21 (×3): via INTRAVENOUS

## 2012-12-19 MED ORDER — SORBITOL 70 % SOLN
30.0000 mL | Freq: Every day | Status: DC | PRN
  Filled 2012-12-19: qty 30

## 2012-12-19 MED ORDER — OXYCODONE HCL 5 MG PO TABS: 5.0000 mg | ORAL_TABLET | ORAL | Status: DC | PRN

## 2012-12-19 NOTE — Code Documentation (Signed)
neurologist arrival 1402 Pt arrival in CT 1415 Phlebotomist arrival  [phone]

## 2012-12-19 NOTE — ED Notes (Signed)
Heart Healthy diet ordered for pt

## 2012-12-19 NOTE — Progress Notes (Signed)
Subjective:    Patient ID: Barbara [name], female    DOB: 09-13-1928, 79 y.o.   MRN: [id number]  HPI Syndrome acute visit in clinic. Patient was sent to a cardiologist this morning and doing well. Upon return to [facility] assisted [facility], it was noted at about 1:15 PM that she had a change in her neurologic status. She seemed more adolescent than usual, although she does have dementia. Speech was garbled and she had no dysarthria. She has a new right-sided weakness of the arm and leg, and there is a new right facial weakness.  Patient has known atrial fibrillation which is chronic. She had a drop in her hemoglobin a little over a month ago. Xarelto was stopped, and she has been on aspirin only since that time. Hemoglobin was low enough to require transfusion 11/30/12.  There has not been a fall or other injury. Patient denies visual change. She does wear glasses.   Review of Systems  Constitutional: Positive for activity change. Negative for chills, diaphoresis, appetite change and fatigue.  HENT:       Right facial weakness  Eyes:       Wears prescription lenses  Respiratory: Negative for cough, choking, chest tightness, shortness of breath, wheezing and stridor.   Cardiovascular: Negative for chest pain, palpitations and leg swelling.        History chronic atrial fibrillation  Gastrointestinal: Negative for abdominal pain and abdominal distention.  Endocrine: Negative.   Genitourinary:       Urgency and incontinence  Musculoskeletal:       New right-sided weakness.  Skin: Negative.   Allergic/Immunologic: Negative.   Neurological:       New-onset right facial weakness and right hemiparesis  Hematological:        Recent anemia requiring transfusion. No gross GI bleeding was noted.  Psychiatric/Behavioral:       Chronic dementia       Objective:   Physical Exam  Constitutional: No distress.  Frail elderly female  HENT:  Head: Normocephalic and atraumatic.  Right Ear:  External ear normal.  Nose: Nose normal.  Eyes: Pupils are equal, round, and reactive to light.  Neck: Normal range of motion. Neck supple. No JVD present. No tracheal deviation present. No thyromegaly present.  Cardiovascular: Exam reveals no gallop and no friction rub.   Murmur heard. Atrial fibrillation  Pulmonary/Chest: Effort normal and breath sounds normal. No respiratory distress. She has no wheezes. She has no rales. She exhibits no tenderness.  Abdominal: Soft. Bowel sounds are normal. She exhibits no distension and no mass. There is no tenderness.  Musculoskeletal: She exhibits no edema and no tenderness.  Weakness of the right arm when sheattempts to extend it. Weak in the right leg when she attempts to raise against gravity.  Lymphadenopathy:    She has no cervical adenopathy.  Skin: Skin is warm and dry. No rash noted. She is not diaphoretic. No erythema. No pallor.  Psychiatric: She has a normal mood and affect. Her behavior is normal.          Assessment & Plan:  Acute, but ill-defined, cerebrovascular disease  Patient has had an acute change in neurologic status. Most likely diagnosis is an embolic stroke related to her chronic atrial fibrillation  Long term (current) use of anticoagulants  Patient previously had been on warfarin, then was switched to Xarelto. Xarelto was discontinued to lower a month ago when she had an acute drop in her hemoglobin. She was to be  restarted on the Eliquis per recommendations of her cardiologist today. The strut has not been started. She has been maintained on aspirin 81 mg period of time but she was off to Xarelto.  Dysarthria  New-onset Hemiparesis affecting dominant side as late effect of stroke  New onset today Atrial fibrillation  Controlled rate  Because of the acute change in her neurologic status, she was sent to the emergency room via ambulance from the [facility] clinic area.

## 2012-12-19 NOTE — ED Notes (Signed)
Attempt to call report; change of shift

## 2012-12-19 NOTE — ED Notes (Signed)
Barbara [name], Neuro PA arrival @ 14:02, EDP exam completed by Ethelda Chick, MD @ 14:13, pt in CT @ 14:15, Phlebotomy arrival @ 14:32, last seen normal today @ 10:00

## 2012-12-19 NOTE — H&P (Signed)
[facility] History and Physical  Barbara [name] AVW:[id number] DOB: 01/19/1929 DOA: 12/19/2012  Referring physician: Dr. Fredderick Phenix PCP: Barbara Relic, MD  Specialists: Dr Anne Fu  Chief Complaint: AMS/facial droop/slurred speech  HPI: Barbara [name] is a 79 y.o. female with past medical history atrial fibrillation was to be started on ELIQUIS today, hyperlipidemia, history of hypothyroidism, and debilitated resident at [facility] nursing home who presents to the ED with several hour history of altered mental status, facial droop, expressive aphasia, slurred speech, right-sided weakness. Patient unable to give the history states she's not sure what happened but does state that she couldn't make any good sense for a while. History was obtained from ED physician's note. Per ED note and periapical patient had presented to his PCPs office on followup secondary to altered mental status. Patient had seen a cardiologist on the morning of admission and was doing well on return to [facility] assisted [facility]. Per notes as patient's daughter the patient in the car she noted that patient had a right facial droop and difficulty getting her words out. Around 1:15 on the afternoon of admission he was noted by staff at the patient's facility that the patient did have some altered mental status, right-sided weakness, right facial weakness, slurred speech and dysarthria. CT of the head which was obtained on admission was negative. Patient denies any fever, no chills, no chest pain, no shortness of breath, no melena, no hematemesis, no hematochezia, no diarrhea, no constipation, no cough, no other associated symptoms. 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 Per notes patient was initially on Coumadin for her atrial fibrillation which was subsequently changed to [facility], however due to a drop in the hemoglobin, xaralto  was discontinued and patient had been on an aspirin. Per her PCPs note patient's hemoglobin was low enough to require transfusion on 11/30/2012. Patient had presented to cardiologist office on the morning of admission and was to be started on ELIQUIS. Patient had yet to take a dose. Patient was seen by neurology the ED will also call to admit the patient.   Review of Systems: The patient denies anorexia, fever, weight loss,, vision loss, decreased hearing, hoarseness, chest pain, syncope, dyspnea on exertion, peripheral edema, balance deficits, hemoptysis, abdominal pain, melena, hematochezia, severe indigestion/heartburn, hematuria, incontinence, genital sores, muscle weakness, suspicious skin lesions, transient blindness, difficulty walking, depression, unusual weight change, abnormal bleeding, enlarged lymph nodes, angioedema, and breast masses.  Past Medical History  Diagnosis Date  . Atrial fibrillation 2009    Long term anticoagulation. Warfarin changed to Xarelto 06/2012  . Cystocele, midline   . Senile dementia, uncomplicated   . Generalized anxiety disorder   . Unspecified glaucoma(365.9)   . Other and unspecified hyperlipidemia   . Unspecified hypothyroidism   . Macular degeneration (senile) of retina, unspecified   . Osteoarthrosis, unspecified whether generalized or localized, unspecified site   . Senile osteoporosis   . Pneumonia, organism unspecified 03/2012    Tx w/ PO antibx  . Unspecified vitamin D deficiency   . Unspecified venous (peripheral) insufficiency   . Tear film insufficiency, unspecified 08/2012  . Debility, unspecified 04/01/2012  . Long term (current) use of anticoagulants 04/01/2012   Past Surgical History  Procedure Laterality Date  . Tonsillectomy and adenoidectomy    . Umbilical hernia repair    . Cataract extraction w/ intraocular lens  implant, bilateral    . Spine surgery  RE; Spinal stenosis   Social History:  reports that she has quit smoking.  Her smoking use included Cigarettes. She smoked 0.00 packs per day. She does not have any smokeless tobacco history on file. She reports that she does not drink alcohol or use illicit drugs.  No Known Allergies  Family History  Problem Relation Age of Onset  . Heart attack Father      Prior to Admission medications   Medication Sig Start Date End Date Taking? Authorizing Provider  acetaminophen (TYLENOL) 325 MG tablet Take 650 mg by mouth 2 (two) times daily.   Yes Historical Provider, MD  apixaban (ELIQUIS) 2.5 MG TABS tablet Take 2.5 mg by mouth 2 (two) times daily.   Yes Historical Provider, MD  Calcium Carbonate-Vitamin D (CALTRATE 600+D) 600-400 MG-UNIT per chew tablet Chew 1 tablet by mouth daily.   Yes Historical Provider, MD  Carboxymethylcellul-Glycerin (OPTIVE) 0.5-0.9 % SOLN Apply 1 drop to eye 3 (three) times daily.   Yes Historical Provider, MD  cholecalciferol (VITAMIN D) 1000 UNITS tablet Take 1,000 Units by mouth daily.   Yes Historical Provider, MD  diltiazem (TIAZAC) 360 MG 24 hr capsule Take 360 mg by mouth daily.   Yes Historical Provider, MD  dorzolamide-timolol (COSOPT) 22.3-6.8 MG/ML ophthalmic solution Place 1 drop into both eyes 2 (two) times daily.   Yes Historical Provider, MD  iron polysaccharides (NIFEREX) 150 MG capsule Take 150 mg by mouth daily.   Yes Historical Provider, MD  levothyroxine (SYNTHROID, LEVOTHROID) 100 MCG tablet Take 100 mcg by mouth daily before breakfast.   Yes Historical Provider, MD  Menthol-Methyl Salicylate (MUSCLE RUB) 10-15 % CREA Apply 1 application topically as needed (Apply at bedtime and as needed to reduce hip /back pain).    Yes Historical Provider, MD  Multiple Vitamins-Minerals (PRESERVISION/LUTEIN) CAPS Take 2 capsules by mouth daily.   Yes Historical Provider, MD  polyethylene glycol (MIRALAX / GLYCOLAX) packet Take 17 g by mouth daily. Mix with 6oz beverage of choice. Hold for loose stool   Yes Historical Provider, MD   sertraline (ZOLOFT) 50 MG tablet Take 50 mg by mouth at bedtime.   Yes Historical Provider, MD   Physical Exam: Filed Vitals:   12/19/12 1415 12/19/12 1428 12/19/12 1536 12/19/12 1604  BP:   140/69   Pulse:   70   Temp:    97.6 F (36.4 C)  Resp:   16   SpO2: 99% 96% 99%      General:  Elderly frail female in no acute cardiopulmonary distress.  Eyes: Pupils equal round and reactive to light and accommodation. Extraocular movements intact.  ENT: Clear, no lesions, no exudates. Dry mucous membranes.  Neck: Supple no lymphadenopathy. No JVD.  Cardiovascular: Regular rate rhythm no murmurs rubs or gallops.  Respiratory: Clear to auscultation bilaterally. No wheezing, no crackles, no rhonchi.  Abdomen: Soft, nontender, nondistended, positive bowel sounds.  Skin: No rashes or lesions.  Musculoskeletal: 5/5 BLE strength. 3-4/5 rue  Psychiatric: Confused. Normal mood normal affect.  Neurologic: Alert, some dysarthria, cranial nerves II through XII are grossly intact. Sensation is intact. Visual fields are intact. Gait not tested secondary to safety.   Labs on Admission:  Basic Metabolic Panel:  Recent Labs Lab 12/19/12 1427 12/19/12 1508  NA 139 141  K 4.0 4.0  CL 105 108  CO2 23  --   GLUCOSE 88 88  BUN 16 17  CREATININE 0.74 0.70  CALCIUM 10.0  --    Liver Function  Tests:  Recent Labs Lab 12/19/12 1427  AST 22  ALT 13  ALKPHOS 106  BILITOT 0.3  PROT 6.8  ALBUMIN 3.5   No results found for this basename: LIPASE, AMYLASE,  in the last 168 hours No results found for this basename: AMMONIA,  in the last 168 hours CBC:  Recent Labs Lab 12/19/12 1427 12/19/12 1508  WBC 7.1  --   NEUTROABS 4.0  --   HGB 11.1* 12.2  HCT 33.6* 36.0  MCV 79.4  --   PLT 231  --    Cardiac Enzymes:  Recent Labs Lab 12/19/12 1443  TROPONINI <0.30    BNP (last 3 results) No results found for this basename: PROBNP,  in the last 8760 hours CBG:  Recent Labs Lab  12/19/12 1424  GLUCAP 94    Radiological Exams on Admission: Ct Head Wo Contrast  12/19/2012  *RADIOLOGY REPORT*  Clinical Data: Code stroke, slurred speech, facial droop, expressive aphasia  CT HEAD WITHOUT CONTRAST  Technique:  Contiguous axial images were obtained from the base of the skull through the vertex without contrast.  Comparison: Brain MRI - 10/15/2009  Findings:  Redemonstrated mild diffuse atrophy and centralized volume loss with commiserate mild ex vacuo dilatation of the ventricular system.  Grossly unchanged rather extensive near confluent periventricular hypodensities, left slightly greater than right. Old lacunar infarct within the right basal ganglia.  Given extensive background parenchymal abnormalities, there is no CT evidence of acute large territory infarct.  No intraparenchymal or extra-axial mass or hemorrhage.  Unchanged size and configuration of the ventricles and basilar cisterns.  No midline shift.  The paranasal sinuses and mastoid air cells are normal.  Regional soft tissues are normal.  No displaced calvarial fracture.  Post bilateral cataract surgery.  IMPRESSION: Advanced atrophy and microvascular ischemic disease without acute intracranial process.  Above findings discussed with Dr. Thad Ranger at 14:24.   Original Report Authenticated By: Tacey Ruiz, MD     EKG: Independently reviewed. Atrial fibrillation  Assessment/Plan Principal Problem:   CVA (cerebral infarction) Active Problems:   Atrial fibrillation   Senile dementia, uncomplicated   Generalized anxiety disorder   Unspecified constipation   Dysarthria   Hemiparesis affecting dominant side as late effect of stroke  #1 probable CVA Patient presented with questionable stroke. Patient also does have a history of atrial fibrillation off anticoagulation x3 weeks. Patient noted to have slurred speech facial droop, altered mental status. Patient does have a history of atrial fibrillation and on  aspirin  prior to this. Will admit the patient to telemetry. Check MRI of the head. Check a 2-D echo. Check carotid Dopplers. PT /OT/ST. Will start patient on eliquis as she was supposed to be started on this today per ED physician's note.  #2 atrial fibrillation Currently rate controlled on diltiazem. Will start patient on eliquis for anticoagulations.  #3 dysarthria/slurred speech is less right-sided weakness See problem #1.  #4 hypothyroidism Check a TSH. Continue home regimen of Synthroid.  #5 hyperlipidemia Check a fasting lipid panel. Will place on low-dose statin.  #6 history of dementia Stable.  #7 prophylaxis PPI for GI prophylaxis. Eliquis for DVT prophylaxis.   Code Status: Full Family Communication: Updated patient no family at bedside. Disposition Plan: Admit to telemetry.  Time spent: 65 mins  [facility] Pager [phone]  If 7PM-7AM, please contact night-coverage [email or web address] Password [facility] 12/19/2012, 4:57 PM

## 2012-12-19 NOTE — ED Notes (Signed)
Admitting, MD at bedside.  

## 2012-12-19 NOTE — ED Notes (Signed)
Pt returned from CT scan, pt given dinner tray, family at bedside

## 2012-12-19 NOTE — ED Notes (Signed)
Pt in from Assisted Living via [facility], per report pt was with daughter to see Cardiology & went to get something to eat, per report pts daughter reported change with mother, EMS reports slurred speech, confusion, & L facial droop, facility called EMS re: reassessment @ 13:00, pt denies CP, SOB, N/V/D, CBG 104 in route, a fib in route

## 2012-12-19 NOTE — Consult Note (Addendum)
Referring Physician: Fredderick Phenix    Chief Complaint: code stroke  HPI:                                                                                                                                         Barbara [name] is an 79 y.o. female with known A-fib who was on Xeralto but recently was taken off this medication due to questionable GI bleed needing transfusion.  Patient was picked up at nursing home by her daughter at about 10 AM.  Initially there was no abnormalities noted, as they got to the car her daughter noted she had a right facial droop and having difficulty getting her thoughts out.  They continued to shop during the day.  At 1300 hours she was brought back to nursing home and daughter noted to staff she was not her normal self.  EMS was called.  Patient was brought to ED as a code stroke.  She was out of the 4 hour window and her symptoms had improved at time of arrival.  She continued to have right sided weakness and right facial droop.   Of note: She has been off Xeralto for three weeks due to question of GI bleed. Per daughter she has seen GI and they wanted to keep a watch on her prior to doing upper gI scope. In addition,  she had seen cardiology today and was started on eliquis, she has not taken a dose as of this point.   Date last known well: 5.12.14 Time last known well: 10.00 NIHSS: 3 tPA Given: No: out of window and resolving symptoms, recent GIB requiring transfusion   Past Medical History  Diagnosis Date  . Atrial fibrillation 2009    Long term anticoagulation. Warfarin changed to Xarelto 06/2012  . Cystocele, midline   . Senile dementia, uncomplicated   . Generalized anxiety disorder   . Unspecified glaucoma(365.9)   . Other and unspecified hyperlipidemia   . Unspecified hypothyroidism   . Macular degeneration (senile) of retina, unspecified   . Osteoarthrosis, unspecified whether generalized or localized, unspecified site   . Senile osteoporosis   .  Pneumonia, organism unspecified 03/2012    Tx w/ PO antibx  . Unspecified vitamin D deficiency   . Unspecified venous (peripheral) insufficiency   . Tear film insufficiency, unspecified 08/2012  . Debility, unspecified 04/01/2012  . Long term (current) use of anticoagulants 04/01/2012    Past Surgical History  Procedure Laterality Date  . Tonsillectomy and adenoidectomy    . Umbilical hernia repair    . Cataract extraction w/ intraocular lens  implant, bilateral    . Spine surgery      RE; Spinal stenosis    Family History  Problem Relation Age of Onset  . Heart attack Father    Social History:  reports that she has quit smoking. Her smoking use included Cigarettes. She smoked 0.00  packs per day. She does not have any smokeless tobacco history on file. She reports that she does not drink alcohol or use illicit drugs.  Allergies: No Known Allergies  Medications:                                                                                                                           No current facility-administered medications for this encounter.   Current Outpatient Prescriptions  Medication Sig Dispense Refill  . acetaminophen (TYLENOL) 325 MG tablet Take 650 mg by mouth 2 (two) times daily.      . Calcium Carbonate-Vitamin D (CALTRATE 600+D) 600-400 MG-UNIT per chew tablet Chew 1 tablet by mouth daily.      . Carboxymethylcellul-Glycerin (OPTIVE) 0.5-0.9 % SOLN Apply 1 drop to eye 3 (three) times daily.      . cholecalciferol (VITAMIN D) 1000 UNITS tablet Take 1,000 Units by mouth daily.      Marland Kitchen diltiazem (TIAZAC) 360 MG 24 hr capsule Take 360 mg by mouth daily.      . dorzolamide-timolol (COSOPT) 22.3-6.8 MG/ML ophthalmic solution Place 1 drop into both eyes 2 (two) times daily.      Marland Kitchen levothyroxine (SYNTHROID, LEVOTHROID) 100 MCG tablet Take 100 mcg by mouth daily before breakfast.      . Menthol-Methyl Salicylate (MUSCLE RUB) 10-15 % CREA Apply 1 application topically as needed  (Apply at bedtine and as needed to reduce hip /back pain).      . Multiple Vitamins-Minerals (PRESERVISION/LUTEIN) CAPS Take 2 capsules by mouth daily.      . polyethylene glycol (MIRALAX / GLYCOLAX) packet Take 17 g by mouth daily. Mix with 6oz beverage of choice. Hold for loose stool              . sertraline (ZOLOFT) 50 MG tablet Take 50 mg by mouth at bedtime.      . silver sulfADIAZINE (SILVADENE) 1 % cream          ROS:                                                                                                                                       History obtained from the patient  General ROS: negative for - chills, fatigue, fever, night sweats, weight gain or weight loss Psychological ROS: negative for - behavioral disorder, hallucinations, memory difficulties, mood swings or suicidal  ideation Ophthalmic ROS: negative for - blurry vision, double vision, eye pain or loss of vision ENT ROS: negative for - epistaxis, nasal discharge, oral lesions, sore throat, tinnitus or vertigo Allergy and Immunology ROS: negative for - hives or itchy/watery eyes Hematological and Lymphatic ROS: negative for - bleeding problems, bruising or swollen lymph nodes Endocrine ROS: negative for - galactorrhea, hair pattern changes, polydipsia/polyuria or temperature intolerance Respiratory ROS: negative for - cough, hemoptysis, shortness of breath or wheezing Cardiovascular ROS: negative for - chest pain, dyspnea on exertion, edema or irregular heartbeat Gastrointestinal ROS: negative for - abdominal pain, diarrhea, hematemesis, nausea/vomiting or stool incontinence Genito-Urinary ROS: negative for - dysuria, hematuria, incontinence or urinary frequency/urgency Musculoskeletal ROS: negative for - joint swelling or muscular weakness Neurological ROS: as noted in HPI Dermatological ROS: negative for rash and skin lesion changes  Neurologic Examination:                                                                                                       SpO2 96.00%.  Mental Status: Alert, oriented to hospital but stated she was 80, able to follow commands.  Speech fluent --initially able to make compressible sentences but on second examination she showed intermittent expressive difficulties.   Able to follow 3 step commands without difficulty. Cranial Nerves: II: Discs flat bilaterally; Visual fields shows decreased bilateral peripheral vision (known macular degeneration), pupils equal, round, reactive to light and accommodation III,IV, VI: ptosis not present, extra-ocular motions intact bilaterally V,VII: smile asymmetric on the right, facial light touch sensation decreased on the right VIII: hearing normal bilaterally IX,X: gag reflex present XI: bilateral shoulder shrug XII: midline tongue extension Motor: Right : Upper extremity   4/5    Left:     Upper extremity   5/5  Lower extremity   4/5     Lower extremity   5/5 Tone and bulk:normal tone throughout; no atrophy noted Sensory: Pinprick and light touch intact throughout, bilaterally with no extinction Deep Tendon Reflexes: 2+ and symmetric throughout Plantars: Right: downgoing   Left: downgoing Cerebellar: normal finger-to-nose,  normal heel-to-shin test CV: pulses palpable throughout    Results for orders placed during the hospital encounter of 12/19/12 (from the past 48 hour(s))  GLUCOSE, CAPILLARY     Status: None   Collection Time    12/19/12  2:24 PM      Result Value Range   Glucose-Capillary 94  70 - 99 mg/dL  POCT I-STAT, CHEM 8     Status: None   Collection Time    12/19/12  3:08 PM      Result Value Range   Sodium 141  135 - 145 mEq/L   Potassium 4.0  3.5 - 5.1 mEq/L   Chloride 108  96 - 112 mEq/L   BUN 17  6 - 23 mg/dL   Creatinine, Ser 0.45  0.50 - 1.10 mg/dL   Glucose, Bld 88  70 - 99 mg/dL   Calcium, Ion 4.09  8.11 - 1.30 mmol/L   TCO2 27  0 - 100 mmol/L  Hemoglobin 12.2  12.0 - 15.0 g/dL   HCT 16.1   09.6 - 04.5 %   Ct Head Wo Contrast  12/19/2012  *RADIOLOGY REPORT*  Clinical Data: Code stroke, slurred speech, facial droop, expressive aphasia  CT HEAD WITHOUT CONTRAST  Technique:  Contiguous axial images were obtained from the base of the skull through the vertex without contrast.  Comparison: Brain MRI - 10/15/2009  Findings:  Redemonstrated mild diffuse atrophy and centralized volume loss with commiserate mild ex vacuo dilatation of the ventricular system.  Grossly unchanged rather extensive near confluent periventricular hypodensities, left slightly greater than right. Old lacunar infarct within the right basal ganglia.  Given extensive background parenchymal abnormalities, there is no CT evidence of acute large territory infarct.  No intraparenchymal or extra-axial mass or hemorrhage.  Unchanged size and configuration of the ventricles and basilar cisterns.  No midline shift.  The paranasal sinuses and mastoid air cells are normal.  Regional soft tissues are normal.  No displaced calvarial fracture.  Post bilateral cataract surgery.  IMPRESSION: Advanced atrophy and microvascular ischemic disease without acute intracranial process.  Above findings discussed with Dr. Thad Ranger at 14:24.   Original Report Authenticated By: Tacey Ruiz, MD     Assessment and plan discussed with with attending physician and they are in agreement.    Felicie Morn PA-C [facility] [phone]  12/19/2012, 3:11 PM   Patient seen and examined.  Clinical course and management discussed.  Necessary edits performed.  I agree with the above.  Assessment and plan of care developed and discussed below.    Assessment: 79 y.o. female with a history of atrial fibrillation off anticoagulation for the past 3 weeks now with facial droop and mild hemiparesis.  Concerned about the possibility of acute infarct.  Further work up recommended.    Stroke Risk Factors - atrial fibrillation and hyperlipidemia  Plan: 1.  HgbA1c, fasting lipid panel 2. MRI, MRA  of the brain without contrast 3. PT consult, OT consult, Speech consult 4. Echocardiogram 5. Carotid dopplers 6. Prophylactic therapy-Would start Eliquis as recommended.  Would await MRI results before initiation of first dose.   7. Risk factor modification 8. Telemetry monitoring 9. Frequent neuro checks  Case discussed with Dr. Rosita Kea, MD [facility] [phone]  12/19/2012  4:37 PM

## 2012-12-19 NOTE — ED Provider Notes (Signed)
History     CSN: [id number]  Arrival date & time 12/19/12  1412   First MD Initiated Contact with Patient 12/19/12 1421      Chief Complaint  Patient presents with  . Code Stroke    (Consider location/radiation/quality/duration/timing/severity/associated sxs/prior treatment) HPI Comments: Patient was sent over by EMS from [facility] facility with stroke symptoms. She was came in as a code stroke. At 10:00 she left the facility with her daughter to go out to lunch him to cardiology appointment. The nursing home staff said that she was normal at this time however the daughter said that she noticed in the car that she had some slurred speech and when she sat down and when she noticed that she had right-sided facial drooping. Given this is unclear when her last known normal exactly was. At lunch the daughter had noticed some slurred speech right-sided protrusion pain and when she was at cardiologist office she noticed that she couldn't signer seating to the right hand. She also feels that she's more acute confused than normal. Patient does have baseline confusion due to dementia. She however doesn't typically ambulate normally. She denies a history of stroke in the past. Per the patient's daughter and she was previously on Xarelto for occasional fibrillation however this was stopped about 3 weeks ago due to the low hemoglobin and suspected GI bleed. The cardiologist today  was going to start her back on Eliquis.   Past Medical History  Diagnosis Date  . Atrial fibrillation 2009    Long term anticoagulation. Warfarin changed to Xarelto 06/2012  . Cystocele, midline   . Senile dementia, uncomplicated   . Generalized anxiety disorder   . Unspecified glaucoma(365.9)   . Other and unspecified hyperlipidemia   . Unspecified hypothyroidism   . Macular degeneration (senile) of retina, unspecified   . Osteoarthrosis, unspecified whether generalized or localized, unspecified site   .  Senile osteoporosis   . Pneumonia, organism unspecified 03/2012    Tx w/ PO antibx  . Unspecified vitamin D deficiency   . Unspecified venous (peripheral) insufficiency   . Tear film insufficiency, unspecified 08/2012  . Debility, unspecified 04/01/2012  . Long term (current) use of anticoagulants 04/01/2012    Past Surgical History  Procedure Laterality Date  . Tonsillectomy and adenoidectomy    . Umbilical hernia repair    . Cataract extraction w/ intraocular lens  implant, bilateral    . Spine surgery      RE; Spinal stenosis    Family History  Problem Relation Age of Onset  . Heart attack Father     History  Substance Use Topics  . Smoking status: Former Smoker    Types: Cigarettes  . Smokeless tobacco: Not on file  . Alcohol Use: No    OB History   Grav Para Term Preterm Abortions TAB SAB Ect Mult Living                  Review of Systems  Unable to perform ROS: Dementia    Allergies  Review of patient's allergies indicates no known allergies.  Home Medications   Current Outpatient Rx  Name  Route  Sig  Dispense  Refill  . acetaminophen (TYLENOL) 325 MG tablet   Oral   Take 650 mg by mouth 2 (two) times daily.         . Calcium Carbonate-Vitamin D (CALTRATE 600+D) 600-400 MG-UNIT per chew tablet   Oral   Chew 1 tablet by mouth  daily.         . Carboxymethylcellul-Glycerin (OPTIVE) 0.5-0.9 % SOLN   Ophthalmic   Apply 1 drop to eye 3 (three) times daily.         . cholecalciferol (VITAMIN D) 1000 UNITS tablet   Oral   Take 1,000 Units by mouth daily.         Marland Kitchen diltiazem (TIAZAC) 360 MG 24 hr capsule   Oral   Take 360 mg by mouth daily.         . dorzolamide-timolol (COSOPT) 22.3-6.8 MG/ML ophthalmic solution   Both Eyes   Place 1 drop into both eyes 2 (two) times daily.         Marland Kitchen levothyroxine (SYNTHROID, LEVOTHROID) 100 MCG tablet   Oral   Take 100 mcg by mouth daily before breakfast.         . Menthol-Methyl Salicylate  (MUSCLE RUB) 10-15 % CREA   Topical   Apply 1 application topically as needed (Apply at bedtine and as needed to reduce hip /back pain).         . Multiple Vitamins-Minerals (PRESERVISION/LUTEIN) CAPS   Oral   Take 2 capsules by mouth daily.         . polyethylene glycol (MIRALAX / GLYCOLAX) packet   Oral   Take 17 g by mouth daily. Mix with 6oz beverage of choice. Hold for loose stool         . Rivaroxaban (XARELTO) 15 MG TABS tablet   Oral   Take 15 mg by mouth daily.         . sertraline (ZOLOFT) 50 MG tablet   Oral   Take 50 mg by mouth at bedtime.         . silver sulfADIAZINE (SILVADENE) 1 % cream                 BP 140/69  Pulse 70  Temp(Src) 97.6 F (36.4 C)  Resp 16  SpO2 99%  Physical Exam  Constitutional: She is oriented to person, place, and time. She appears well-developed and well-nourished.  HENT:  Head: Normocephalic and atraumatic.  Eyes: Pupils are equal, round, and reactive to light.  Neck: Normal range of motion. Neck supple.  Cardiovascular: Normal rate, regular rhythm and normal heart sounds.   Pulmonary/Chest: Effort normal and breath sounds normal. No respiratory distress. She has no wheezes. She has no rales. She exhibits no tenderness.  Abdominal: Soft. Bowel sounds are normal. There is no tenderness. There is no rebound and no guarding.  Musculoskeletal: Normal range of motion. She exhibits no edema.  Lymphadenopathy:    She has no cervical adenopathy.  Neurological: She is alert and oriented to person, place, and time.  Patient has some right-sided facial drooping. She has some mild slurring of her speech. She is alert and oriented to person and place. She has significant weakness in her right arm as compared to the left. She's with both legs against gravity but has some increased weakness in her right leg as compared to the left.  Skin: Skin is warm and dry. No rash noted.  Psychiatric: She has a normal mood and affect.    ED  Course  Procedures (including critical care time)  Labs Reviewed  CBC - Abnormal; Notable for the following:    Hemoglobin 11.1 (*)    HCT 33.6 (*)    RDW 19.5 (*)    All other components within normal limits  DIFFERENTIAL - Abnormal; Notable for the  following:    Monocytes Relative 14 (*)    All other components within normal limits  COMPREHENSIVE METABOLIC PANEL - Abnormal; Notable for the following:    GFR calc non Af Amer 78 (*)    All other components within normal limits  ETHANOL  PROTIME-INR  APTT  TROPONIN I  GLUCOSE, CAPILLARY  URINE RAPID DRUG SCREEN (HOSP PERFORMED)  URINALYSIS, ROUTINE W REFLEX MICROSCOPIC  POCT I-STAT, CHEM 8  POCT I-STAT TROPONIN I   Ct Head Wo Contrast  12/19/2012  *RADIOLOGY REPORT*  Clinical Data: Code stroke, slurred speech, facial droop, expressive aphasia  CT HEAD WITHOUT CONTRAST  Technique:  Contiguous axial images were obtained from the base of the skull through the vertex without contrast.  Comparison: Brain MRI - 10/15/2009  Findings:  Redemonstrated mild diffuse atrophy and centralized volume loss with commiserate mild ex vacuo dilatation of the ventricular system.  Grossly unchanged rather extensive near confluent periventricular hypodensities, left slightly greater than right. Old lacunar infarct within the right basal ganglia.  Given extensive background parenchymal abnormalities, there is no CT evidence of acute large territory infarct.  No intraparenchymal or extra-axial mass or hemorrhage.  Unchanged size and configuration of the ventricles and basilar cisterns.  No midline shift.  The paranasal sinuses and mastoid air cells are normal.  Regional soft tissues are normal.  No displaced calvarial fracture.  Post bilateral cataract surgery.  IMPRESSION: Advanced atrophy and microvascular ischemic disease without acute intracranial process.  Above findings discussed with Dr. Thad Ranger at 14:24.   Original Report Authenticated By: Tacey Ruiz, MD      Date: 12/19/2012  Rate: 76  Rhythm: atrial fibrillation  QRS Axis: normal  Intervals: normal  ST/T Wave abnormalities: nonspecific ST/T changes  Conduction Disutrbances:none  Narrative Interpretation:   Old EKG Reviewed: unchanged  Results for orders placed during the hospital encounter of 12/19/12  ETHANOL      Result Value Range   Alcohol, Ethyl (B) <11  0 - 11 mg/dL  PROTIME-INR      Result Value Range   Prothrombin Time 13.9  11.6 - 15.2 seconds   INR 1.08  0.00 - 1.49  APTT      Result Value Range   aPTT 31  24 - 37 seconds  CBC      Result Value Range   WBC 7.1  4.0 - 10.5 K/uL   RBC 4.23  3.87 - 5.11 MIL/uL   Hemoglobin 11.1 (*) 12.0 - 15.0 g/dL   HCT 16.1 (*) 09.6 - 04.5 %   MCV 79.4  78.0 - 100.0 fL   MCH 26.2  26.0 - 34.0 pg   MCHC 33.0  30.0 - 36.0 g/dL   RDW 40.9 (*) 81.1 - 91.4 %   Platelets 231  150 - 400 K/uL  DIFFERENTIAL      Result Value Range   Neutrophils Relative 56  43 - 77 %   Neutro Abs 4.0  1.7 - 7.7 K/uL   Lymphocytes Relative 28  12 - 46 %   Lymphs Abs 2.0  0.7 - 4.0 K/uL   Monocytes Relative 14 (*) 3 - 12 %   Monocytes Absolute 1.0  0.1 - 1.0 K/uL   Eosinophils Relative 2  0 - 5 %   Eosinophils Absolute 0.1  0.0 - 0.7 K/uL   Basophils Relative 0  0 - 1 %   Basophils Absolute 0.0  0.0 - 0.1 K/uL  COMPREHENSIVE METABOLIC PANEL  Result Value Range   Sodium 139  135 - 145 mEq/L   Potassium 4.0  3.5 - 5.1 mEq/L   Chloride 105  96 - 112 mEq/L   CO2 23  19 - 32 mEq/L   Glucose, Bld 88  70 - 99 mg/dL   BUN 16  6 - 23 mg/dL   Creatinine, Ser 1.61  0.50 - 1.10 mg/dL   Calcium 09.6  8.4 - 04.5 mg/dL   Total Protein 6.8  6.0 - 8.3 g/dL   Albumin 3.5  3.5 - 5.2 g/dL   AST 22  0 - 37 U/L   ALT 13  0 - 35 U/L   Alkaline Phosphatase 106  39 - 117 U/L   Total Bilirubin 0.3  0.3 - 1.2 mg/dL   GFR calc non Af Amer 78 (*) >90 mL/min   GFR calc Af Amer >90  >90 mL/min  TROPONIN I      Result Value Range   Troponin I <0.30  <0.30 ng/mL   GLUCOSE, CAPILLARY      Result Value Range   Glucose-Capillary 94  70 - 99 mg/dL  POCT I-STAT, CHEM 8      Result Value Range   Sodium 141  135 - 145 mEq/L   Potassium 4.0  3.5 - 5.1 mEq/L   Chloride 108  96 - 112 mEq/L   BUN 17  6 - 23 mg/dL   Creatinine, Ser 4.09  0.50 - 1.10 mg/dL   Glucose, Bld 88  70 - 99 mg/dL   Calcium, Ion 8.11  9.14 - 1.30 mmol/L   TCO2 27  0 - 100 mmol/L   Hemoglobin 12.2  12.0 - 15.0 g/dL   HCT 78.2  95.6 - 21.3 %  POCT I-STAT TROPONIN I      Result Value Range   Troponin i, poc 0.00  0.00 - 0.08 ng/mL   Comment 3            Ct Head Wo Contrast  12/19/2012  *RADIOLOGY REPORT*  Clinical Data: Code stroke, slurred speech, facial droop, expressive aphasia  CT HEAD WITHOUT CONTRAST  Technique:  Contiguous axial images were obtained from the base of the skull through the vertex without contrast.  Comparison: Brain MRI - 10/15/2009  Findings:  Redemonstrated mild diffuse atrophy and centralized volume loss with commiserate mild ex vacuo dilatation of the ventricular system.  Grossly unchanged rather extensive near confluent periventricular hypodensities, left slightly greater than right. Old lacunar infarct within the right basal ganglia.  Given extensive background parenchymal abnormalities, there is no CT evidence of acute large territory infarct.  No intraparenchymal or extra-axial mass or hemorrhage.  Unchanged size and configuration of the ventricles and basilar cisterns.  No midline shift.  The paranasal sinuses and mastoid air cells are normal.  Regional soft tissues are normal.  No displaced calvarial fracture.  Post bilateral cataract surgery.  IMPRESSION: Advanced atrophy and microvascular ischemic disease without acute intracranial process.  Above findings discussed with Dr. Thad Ranger at 14:24.   Original Report Authenticated By: Tacey Ruiz, MD       1. CVA (cerebral infarction)       MDM  Patient history of A. fib who has been off her  anticoagulants due to GI bleed presents with stroke symptoms. She was outside the TPA window. She was assessed by neurology who recommended admission by the hospitalist service. I will discuss the patient with the hospitalist service and plan on admission.  Rolan Bucco, MD 12/19/12 [phone]

## 2012-12-19 NOTE — ED Notes (Addendum)
Per Council Mechanic, RN with [facility], Q2H neuro checks can be started

## 2012-12-19 NOTE — Patient Instructions (Signed)
You are being sent to the emergency room for evaluation of your acute change in neurologic status.

## 2012-12-20 ENCOUNTER — Inpatient Hospital Stay (HOSPITAL_COMMUNITY): Payer: Medicare PPO

## 2012-12-20 LAB — LIPID PANEL
HDL: 51 mg/dL (ref >39)
LDL Cholesterol: 104 mg/dL — ABNORMAL HIGH (ref 0–99)

## 2012-12-20 LAB — CBC
MCHC: 31.8 g/dL (ref 30.0–36.0)
RDW: 19.4 % — ABNORMAL HIGH (ref 11.5–15.5)

## 2012-12-20 LAB — BASIC METABOLIC PANEL
BUN: 10 mg/dL (ref 6–23)
Creatinine, Ser: 0.68 mg/dL (ref 0.50–1.10)
GFR calc Af Amer: >90 mL/min (ref >90)
GFR calc non Af Amer: 80 mL/min — ABNORMAL LOW (ref >90)

## 2012-12-20 LAB — HEMOGLOBIN A1C: Hgb A1c MFr Bld: 5.5 % (ref <5.7)

## 2012-12-20 LAB — TSH: TSH: 0.033 u[IU]/mL — ABNORMAL LOW (ref 0.350–4.500)

## 2012-12-20 MED ORDER — ATORVASTATIN CALCIUM 10 MG PO TABS
10.0000 mg | ORAL_TABLET | Freq: Every day | ORAL | Status: DC
  Filled 2012-12-20: qty 1

## 2012-12-20 MED ORDER — LEVOTHYROXINE SODIUM 88 MCG PO TABS
88.0000 ug | ORAL_TABLET | Freq: Every day | ORAL | Status: DC
  Administered 2012-12-21: 88 ug via ORAL
  Filled 2012-12-20 (×2): qty 1

## 2012-12-20 NOTE — Evaluation (Signed)
Physical Therapy Evaluation Patient Details Name: Barbara [name] MRN: [id number] DOB: 09/12/1928 Today's Date: 12/20/2012 Time: 4098-1191 PT Time Calculation (min): 31 min  PT Assessment / Plan / Recommendation Clinical Impression  Pt amitted for CVA. Pt with history of dementia and living at [facility] assisted living prior to admission. Per pt's daughter, pt has returned to baseline cognitively. Pt demonstrating ability to perform transfers and ambulation, but requires supervision for saftey do to minor dynamic balance impairments. Pt would benefit from further balance assessment to determine possible use of assistive device for ambulation either by acute PT or home heatlth PT depending on speed of d/c.     PT Assessment  Patient needs continued PT services    Follow Up Recommendations  Supervision for mobility/OOB;Home health PT             Recommendations for Other Services OT consult   Frequency Min 3X/week    Precautions / Restrictions Precautions Precaution Comments: thoracic compression fx (avoid excessive trunk flexion) Restrictions Weight Bearing Restrictions: No   Pertinent Vitals/Pain Pt denies pain      Mobility  Bed Mobility Bed Mobility: Supine to Sit Supine to Sit: 6: Modified independent (Device/Increase time);HOB flat Transfers Transfers: Sit to Stand;Stand to Sit Sit to Stand: 5: Supervision Stand to Sit: 5: Supervision Details for Transfer Assistance: Pt able to perform sit to stand with minor increased sway once reaching the standing position. Ambulation/Gait Ambulation/Gait Assistance: 4: Min assist Ambulation Distance (Feet): 620 Feet Assistive device: None Ambulation/Gait Assistance Details: pt with tendancy to drift to the left when ambulating. Pt able to self correct 85% of the time, but needs occasional v/c and t/c to correct.  Gait Pattern: Decreased stance time - right;Step-through pattern Gait velocity: normal, with fatigue causing a  decrease Stairs: No Modified Rankin (Stroke Patients Only) Pre-Morbid Rankin Score: Slight disability Modified Rankin: Moderate disability         PT Diagnosis: Difficulty walking  PT Problem List: Decreased balance PT Treatment Interventions: Balance training;Neuromuscular re-education;Gait training;DME instruction   PT Goals Acute Rehab PT Goals PT Goal Formulation: With patient Pt will Ambulate: >150 feet;with least restrictive assistive device;with gait velocity >(comment) ft/second;with modified independence (velocty >1.8 ft/s) PT Goal: Ambulate - Progress: Goal set today  Visit Information  Last PT Received On: 12/20/12 Assistance Needed: +1    Subjective Data  Subjective: "feeling alright, and my back is feeling better." Patient Stated Goal: get back home   Prior Functioning  Home Living Lives With: Alone Available Help at Discharge: Family;Available 24 hours/day Type of Home: Assisted living Home Access: Level entry Home Layout: One level Bathroom Shower/Tub: Health visitor: Handicapped height Home Adaptive Equipment: Built-in shower seat;Grab bars around toilet;Grab bars in shower Additional Comments: pt living at [facility] assisted living previously Prior Function Level of Independence: Independent with assistive device(s) Able to Take Stairs?: Yes Vocation: Retired Comments: pt reports participating in exercise programs and being very active prior to admission. Communication Communication: No difficulties    Cognition  Cognition Arousal/Alertness: Awake/alert Behavior During Therapy: WFL for tasks assessed/performed Overall Cognitive Status: History of cognitive impairments - at baseline    Extremity/Trunk Assessment Right Upper Extremity Assessment RUE Coordination: WFL - gross motor Left Upper Extremity Assessment LUE Coordination: WFL - gross motor Right Lower Extremity Assessment RLE ROM/Strength/Tone: WFL for tasks assessed  (4-/5 hip flexion MMT ) RLE Coordination: WFL - gross motor Left Lower Extremity Assessment LLE ROM/Strength/Tone: WFL for tasks assessed (4-/5 hip flexion  MMT) LLE Coordination: WFL - gross motor Trunk Assessment Trunk Assessment: Kyphotic   Balance Balance Balance Assessed: Yes Dynamic Sitting Balance Dynamic Sitting - Balance Support: No upper extremity supported Dynamic Sitting - Level of Assistance: 5: Stand by assistance Dynamic Sitting - Balance Activities: Reaching for objects Dynamic Sitting - Comments: pt performing finger-nose-finger coordination test. Pt aware of loss of balance when reaching outside of base of support. Static Standing Balance Static Standing - Balance Support: No upper extremity supported;During functional activity Static Standing - Level of Assistance: 5: Stand by assistance Static Standing - Comment/# of Minutes: 3 minutes without v/c or t/c to maintain balance.  Dynamic Standing Balance Dynamic Standing - Balance Support: No upper extremity supported Dynamic Standing - Level of Assistance: 5: Stand by assistance Dynamic Standing - Comments: pt brushing teeth without assistance High Level Balance High Level Balance Activites: Head turns High Level Balance Comments: pt with decreased ambulation speed while maintaining head turn, and requiring min assist to maintain balance.  End of Session PT - End of Session Equipment Utilized During Treatment: Gait belt Activity Tolerance: Patient tolerated treatment well Patient left: in chair;with call bell/phone within reach;with family/visitor present Nurse Communication: Mobility status  GP     Payton Doughty 12/20/2012, 12:13 PM Payton Doughty, PT Student

## 2012-12-20 NOTE — Clinical Social Work Psychosocial (Signed)
Clinical Social Work Department BRIEF PSYCHOSOCIAL ASSESSMENT 12/20/2012  Patient:  Barbara [name], Barbara [name]     Account Number:  [id number]     Admit date:  12/19/2012  Clinical Social Worker:  Skip Mayer  Date/Time:  12/20/2012 10:52 AM  Referred by:  Physician  Date Referred:  12/20/2012 Referred for  SNF Placement   Other Referral:   Interview type:  Family Other interview type:    PSYCHOSOCIAL DATA Living Status:  FACILITY Admitted from facility:  [facility], [facility], [facility] Level of care:  Assisted Living Primary support name:  Barbara [name] Primary support relationship to patient:  CHILD, ADULT Degree of support available:   Strong support    CURRENT CONCERNS Current Concerns  Post-Acute Placement   Other Concerns:    SOCIAL WORK ASSESSMENT / PLAN CSW spoke with pt's dtr and [facility] admissions coordinator, Durward Mallard, re: d/c planning.  Pt admitted from [facility]F.  PT/OT pending.  Per Durward Mallard, pt will have SNF bed available if needed.  CSW will continue to follow.   Assessment/plan status:  Information/Referral to [facility] Other assessment/ plan:   Information/referral to community resources:   SNF  PTAR    PATIENT'S/FAMILY'S RESPONSE TO PLAN OF CARE: Pt's dtr reports agreeable to ALF return vs. SNF at [facility].  Will follow for level of care recommendations.       Dellie Burns, MSW, [place] [phone] (coverage)

## 2012-12-20 NOTE — Progress Notes (Signed)
  Echocardiogram 2D Echocardiogram has been performed.  Barbara [name] [name] 12/20/2012, 12:01 PM

## 2012-12-20 NOTE — Evaluation (Signed)
I have read and agree with the below assessment and plan.   Stuart Guillen Helen Whitlow PT, DPT Pager: [phone] 

## 2012-12-20 NOTE — Progress Notes (Signed)
SLP Cancellation Note  Patient Details Name: Barbara [name] MRN: [id number] DOB: 1928-11-07   Cancelled treatment:        Unable to complete SLE at this time. In room with nursing when eval attempted this am.  Will continue efforts.  Celia B. Dimock, [facility], CCC-SLP [phone]  Leigh Aurora 12/20/2012, 12:41 PM

## 2012-12-20 NOTE — Progress Notes (Signed)
[facility] PROGRESS NOTE  Barbara [name] UJW:[id number] DOB: 13-Nov-1928 DOA: 12/19/2012 PCP: Kimber Relic, MD  Assessment/Plan: #1 acute CVA Patient with clinical improvement. The patient presented outside the TPA window and symptoms were improving as such TPA was not given. 2-D echo with EF of 60-65%. No cardiac source of emboli noted. Preliminary carotid Doppler is negative. LDL is 104. Patient does have a history of atrial fibrillation and was to be started on a eliquis on day of presentation. PT/OT/ST. Start low dose Lipitor. Continue eliquis. Neurology following and appreciate input and recommendations.  #2 atrial fibrillation Continue Cardizem for rate control. Eliquis for anticoagulation.  #3 hypothyroidism TSH was 0.033. Decrease Synthroid to 88 mcg daily.  #4 hyperlipidemia LDL is 104. Start low dose Lipitor.  #5 dementia Stable.  #6 prophylaxis PPI for GI prophylaxis. Eliquis for DVT prophylaxis.  Code Status: Full Family Communication: Updated patient and daughter and son at bedside. Disposition Plan: Back to [facility] when medically stable   Consultants:  Neurology: Dr. Thad Ranger 12/19/2012  Procedures:  CT of the head 12/19/2012  MRI of the head 12/20/2012  2-D echo 12/20/2012  Carotid Dopplers 12/20/2012  Antibiotics:  None  HPI/Subjective: Patient states she's feeling better. Expressive aphasia improving.  Objective: Filed Vitals:   12/20/12 0048 12/20/12 0204 12/20/12 0441 12/20/12 0449  BP: 127/71 129/91 131/65   Pulse: 70 78 96   Temp: 98.1 F (36.7 C) 97.8 F (36.6 C) 97.8 F (36.6 C)   TempSrc: Oral Oral Oral   Resp: 16 18 20    Height:      Weight:    46.267 kg (102 lb)  SpO2: 96% 97% 97%     Intake/Output Summary (Last 24 hours) at 12/20/12 1329 Last data filed at 12/20/12 0756  Gross per 24 hour  Intake      0 ml  Output    100 ml  Net   -100 ml   Filed Weights   12/19/12 2011 12/20/12 0449  Weight: 46.9  kg (103 lb 6.3 oz) 46.267 kg (102 lb)    Exam:   General:  NAD  Cardiovascular: Irregularly irregular  Respiratory: CTAB  Abdomen: Soft, nontender, nondistended, positive bowel sounds.  Data Reviewed: Basic Metabolic Panel:  Recent Labs Lab 12/19/12 1427 12/19/12 1508 12/19/12 1600 12/20/12 0555  NA 139 141  --  142  K 4.0 4.0  --  3.5  CL 105 108  --  126*  CO2 23  --   --  25  GLUCOSE 88 88  --  100*  BUN 16 17  --  10  CREATININE 0.74 0.70  --  0.68  CALCIUM 10.0  --   --  9.0  MG  --   --  2.2  --    Liver Function Tests:  Recent Labs Lab 12/19/12 1427  AST 22  ALT 13  ALKPHOS 106  BILITOT 0.3  PROT 6.8  ALBUMIN 3.5   No results found for this basename: LIPASE, AMYLASE,  in the last 168 hours No results found for this basename: AMMONIA,  in the last 168 hours CBC:  Recent Labs Lab 12/19/12 1427 12/19/12 1508 12/20/12 0555  WBC 7.1  --  7.1  NEUTROABS 4.0  --   --   HGB 11.1* 12.2 11.8*  HCT 33.6* 36.0 37.1  MCV 79.4  --  81.0  PLT 231  --  271   Cardiac Enzymes:  Recent Labs Lab 12/19/12 1443  TROPONINI <0.30  BNP (last 3 results) No results found for this basename: PROBNP,  in the last 8760 hours CBG:  Recent Labs Lab 12/19/12 1424  GLUCAP 94    No results found for this or any previous visit (from the past 240 hour(s)).   Studies: Dg Chest 2 View  12/19/2012  *RADIOLOGY REPORT*  Clinical Data: Confusion, atrial fibrillation  CHEST - 2 VIEW  Comparison: Chest radiograph 04/29/2012  Findings: Stable enlarged cardiac silhouette.  Calcifications 19 of the heart are unchanged from prior.  There is however a rounded nodule projecting  over the heart measuring 12 mm which is not clearly seen on prior.  Lungs are hyperinflated.  There is flattened hemidiaphragms.  There is interval compression fracture of the mid thoracic vertebral body with approximately 80% loss of to body height.  This is new from 04/29/2012.  IMPRESSION: 1. No acute  cardiopulmonary findings. 2.  Interval compression fracture of mid thoracic vertebral body. This is age indeterminate but  new from 04/29/2012. 3.  Rounded density projecting over the heart is not clearly seen on prior.  This may relate to cardiac calcifications or rib calcifications but cannot exclude a nodule.  Consider CT thorax for further evaluation.   Original Report Authenticated By: Genevive Bi, M.D.    Ct Head Wo Contrast  12/19/2012  *RADIOLOGY REPORT*  Clinical Data: Code stroke, slurred speech, facial droop, expressive aphasia  CT HEAD WITHOUT CONTRAST  Technique:  Contiguous axial images were obtained from the base of the skull through the vertex without contrast.  Comparison: Brain MRI - 10/15/2009  Findings:  Redemonstrated mild diffuse atrophy and centralized volume loss with commiserate mild ex vacuo dilatation of the ventricular system.  Grossly unchanged rather extensive near confluent periventricular hypodensities, left slightly greater than right. Old lacunar infarct within the right basal ganglia.  Given extensive background parenchymal abnormalities, there is no CT evidence of acute large territory infarct.  No intraparenchymal or extra-axial mass or hemorrhage.  Unchanged size and configuration of the ventricles and basilar cisterns.  No midline shift.  The paranasal sinuses and mastoid air cells are normal.  Regional soft tissues are normal.  No displaced calvarial fracture.  Post bilateral cataract surgery.  IMPRESSION: Advanced atrophy and microvascular ischemic disease without acute intracranial process.  Above findings discussed with Dr. Thad Ranger at 14:24.   Original Report Authenticated By: Tacey Ruiz, MD    Mr Brain Wo Contrast  12/20/2012  *RADIOLOGY REPORT*  Clinical Data:  Right-sided weakness and aphasia which has since resolved.  MRI HEAD WITHOUT CONTRAST MRA HEAD WITHOUT CONTRAST  Technique:  Multiplanar, multiecho pulse sequences of the brain and surrounding  structures were obtained without intravenous contrast. Angiographic images of the head were obtained using MRA technique without contrast.  Comparison:  CT head without contrast 12/19/2012.  MRI brain without contrast 10/15/2009.  MRI HEAD  Findings:  An acute to focal non hemorrhagic infarct in the posterior left insular cortex measures 7 mm maximally.  There are two or three additional punctate areas of restricted diffusion in the posterior left frontal lobe and left parietal lobe.  Extensive periventricular subcortical white matter to the hyperintensity is evident bilaterally.  The no acute hemorrhage or mass lesion is present.  The ventricles are proportionate to the degree of atrophy.  No significant extra-axial fluid collection is present.  The patient is status post bilateral lens extractions.  The paranasal sinuses and mastoid air cells are clear.  IMPRESSION:  1.  Multifocal areas of acute  non hemorrhagic infarcts in the posterior left MCA territory.  The largest area measures 6 mm along the posterior left insular cortex. 2.  Otherwise stable atrophy and extensive white matter disease. This is compatible with the sequelae of chronic microvascular ischemia.  MRA HEAD  Findings: Mild atherosclerotic irregularities are present within the cavernous carotid arteries bilaterally.  The A1 and M1 segments are normal.  No definite anterior communicating artery is patent. The MCA bifurcations are intact.  There is significant attenuation of MCA branch vessels bilaterally, more prominently on the left. The vertebral arteries are codominant.  The left PICA origin is somewhat distorted by artifact.  The right AICA is dominant. Basilar artery is intact.  Both posterior cerebral arteries originate from basilar tip.  There is moderate attenuation of PCA branch vessels.  IMPRESSION:  1.  Moderate small vessel disease. 2.  Asymmetrical left-sided MCA small vessel disease with significant proximal stenoses which may  contribute to the areas of infarction.  These results were called by telephone on 12/20/2012 at 01:25 p.m. to Dr. Janee Morn, who verbally acknowledged these results.   Original Report Authenticated By: Marin Roberts, M.D.    Mr Mra Head/brain Wo Cm  12/20/2012  *RADIOLOGY REPORT*  Clinical Data:  Right-sided weakness and aphasia which has since resolved.  MRI HEAD WITHOUT CONTRAST MRA HEAD WITHOUT CONTRAST  Technique:  Multiplanar, multiecho pulse sequences of the brain and surrounding structures were obtained without intravenous contrast. Angiographic images of the head were obtained using MRA technique without contrast.  Comparison:  CT head without contrast 12/19/2012.  MRI brain without contrast 10/15/2009.  MRI HEAD  Findings:  An acute to focal non hemorrhagic infarct in the posterior left insular cortex measures 7 mm maximally.  There are two or three additional punctate areas of restricted diffusion in the posterior left frontal lobe and left parietal lobe.  Extensive periventricular subcortical white matter to the hyperintensity is evident bilaterally.  The no acute hemorrhage or mass lesion is present.  The ventricles are proportionate to the degree of atrophy.  No significant extra-axial fluid collection is present.  The patient is status post bilateral lens extractions.  The paranasal sinuses and mastoid air cells are clear.  IMPRESSION:  1.  Multifocal areas of acute non hemorrhagic infarcts in the posterior left MCA territory.  The largest area measures 6 mm along the posterior left insular cortex. 2.  Otherwise stable atrophy and extensive white matter disease. This is compatible with the sequelae of chronic microvascular ischemia.  MRA HEAD  Findings: Mild atherosclerotic irregularities are present within the cavernous carotid arteries bilaterally.  The A1 and M1 segments are normal.  No definite anterior communicating artery is patent. The MCA bifurcations are intact.  There is significant  attenuation of MCA branch vessels bilaterally, more prominently on the left. The vertebral arteries are codominant.  The left PICA origin is somewhat distorted by artifact.  The right AICA is dominant. Basilar artery is intact.  Both posterior cerebral arteries originate from basilar tip.  There is moderate attenuation of PCA branch vessels.  IMPRESSION:  1.  Moderate small vessel disease. 2.  Asymmetrical left-sided MCA small vessel disease with significant proximal stenoses which may contribute to the areas of infarction.  These results were called by telephone on 12/20/2012 at 01:25 p.m. to Dr. Janee Morn, who verbally acknowledged these results.   Original Report Authenticated By: Marin Roberts, M.D.     Scheduled Meds: . acetaminophen  650 mg Oral BID  . apixaban  2.5 mg Oral BID  . diltiazem  360 mg Oral Daily  . dorzolamide-timolol  1 drop Both Eyes BID  . iron polysaccharides  150 mg Oral Daily  . levothyroxine  100 mcg Oral QAC breakfast  . pantoprazole  40 mg Oral Q0600  . polyethylene glycol  17 g Oral Daily  . polyvinyl alcohol  1 drop Both Eyes TID  . sertraline  50 mg Oral QHS  . sodium chloride  3 mL Intravenous Q12H   Continuous Infusions: . sodium chloride 75 mL/hr at 12/20/12 1024    Principal Problem:   CVA (cerebral infarction) Active Problems:   Atrial fibrillation   Senile dementia, uncomplicated   Generalized anxiety disorder   Unspecified constipation   Dysarthria   Hemiparesis affecting dominant side as late effect of stroke    Time spent: > 35 mins    [facility]  [facility] Pager [phone]. If 7PM-7AM, please contact night-coverage at [email or web address], password [email or web address] 12/20/2012, 1:29 PM  LOS: 1 day

## 2012-12-20 NOTE — Progress Notes (Signed)
VASCULAR LAB PRELIMINARY  PRELIMINARY  PRELIMINARY  PRELIMINARY Carotid duplex completed.    Preliminary report:  Bilateral:  No evidence of hemodynamically significant internal carotid artery stenosis.   Vertebral artery flow is antegrade.     Barbara [name], RVS 12/20/2012, 12:19 PM

## 2012-12-20 NOTE — Progress Notes (Signed)
Pt c/o back pain, CXR shows thoracic vertebrae compression fracture.  Pt refuses pain meds at this time.

## 2012-12-20 NOTE — Clinical Social Work Placement (Signed)
Clinical Social Work Department CLINICAL SOCIAL WORK PLACEMENT NOTE 12/20/2012  Patient:  Barbara [name], Barbara [name]  Account Number:  [id number] Admit date:  12/19/2012  Clinical Social Worker:  Skip Mayer  Date/time:  12/20/2012 11:00 AM  Clinical Social Work is seeking post-discharge placement for this patient at the following level of care:   SKILLED NURSING   (*CSW will update this form in Epic as items are completed)   12/20/2012  Patient/family provided with [facility] Department of Clinical Social Work's list of facilities offering this level of care within the geographic area requested by the patient (or if unable, by the patient's family).  12/20/2012  Patient/family informed of their freedom to choose among providers that offer the needed level of care, that participate in Medicare, Medicaid or managed care program needed by the patient, have an available bed and are willing to accept the patient.  12/20/2012  Patient/family informed of [facility]' ownership interest in [facility], as well as of the fact that they are under no obligation to receive care at this facility.  PASARR submitted to EDS on 12/20/2012 PASARR number received from EDS on 12/20/2012  FL2 transmitted to all facilities in geographic area requested by pt/family on  12/20/2012 FL2 transmitted to all facilities within larger geographic area on   Patient informed that his/her managed care company has contracts with or will negotiate with  certain facilities, including the following:     Patient/family informed of bed offers received:  12/20/2012 Patient chooses bed at [facility] Physician recommends and patient chooses bed at  [facility]  Patient to be transferred to  on   Patient to be transferred to facility by   The following physician request were entered in Epic:   Additional Comments: From [facility].  SNF level available to accept at [facility] if needed.

## 2012-12-21 DIAGNOSIS — E785 Hyperlipidemia, unspecified: Secondary | ICD-10-CM

## 2012-12-21 DIAGNOSIS — D649 Anemia, unspecified: Secondary | ICD-10-CM

## 2012-12-21 DIAGNOSIS — Z7901 Long term (current) use of anticoagulants: Secondary | ICD-10-CM

## 2012-12-21 DIAGNOSIS — I4891 Unspecified atrial fibrillation: Secondary | ICD-10-CM

## 2012-12-21 DIAGNOSIS — I6350 Cerebral infarction due to unspecified occlusion or stenosis of unspecified cerebral artery: Principal | ICD-10-CM

## 2012-12-21 DIAGNOSIS — F0390 Unspecified dementia without behavioral disturbance: Secondary | ICD-10-CM

## 2012-12-21 LAB — URINE CULTURE: Colony Count: 15000

## 2012-12-21 LAB — BASIC METABOLIC PANEL
BUN: 10 mg/dL (ref 6–23)
CO2: 23 mEq/L (ref 19–32)
Calcium: 9.1 mg/dL (ref 8.4–10.5)
Chloride: 108 mEq/L (ref 96–112)
Creatinine, Ser: 0.68 mg/dL (ref 0.50–1.10)

## 2012-12-21 MED ORDER — SENNOSIDES-DOCUSATE SODIUM 8.6-50 MG PO TABS: 1.0000 | ORAL_TABLET | Freq: Every evening | ORAL | Status: DC | PRN

## 2012-12-21 MED ORDER — ATORVASTATIN CALCIUM 10 MG PO TABS: 10.0000 mg | ORAL_TABLET | Freq: Every day | ORAL | Status: DC

## 2012-12-21 NOTE — Progress Notes (Signed)
I have read and agree with the below treatment note and plan. Benedict Kue Helen Whitlow PT, DPT Pager: [phone] 

## 2012-12-21 NOTE — Progress Notes (Signed)
Physical Therapy Treatment Patient Details Name: Barbara [name] MRN: [id number] DOB: [birth year] Today's Date: 12/21/2012 Time: 7829-5621 PT Time Calculation (min): 29 min  PT Assessment / Plan / Recommendation Comments on Treatment Session  Pt s/p CVA. Pt with dynamic balance deficits during ambulation.  RW introduced today, but pt very adiment that she doesn't need anything to help her walk. DGI and TUG both indicate increased risk for falls without assistive device. Pt tolerates activity well and wants to continue to be mobile, but is unsafe ambulating independently without assistive device. Combination of poor safety awareness and decreased dynamic balance significantly increases fall risk. Pt educated on fall risk and safety awareness; also benefits of using RW. Actue PT to focus on fall prevention and fall risk education for safe d/c to ALF. Will need full balance assessment by home health PT.     Follow Up Recommendations  Home health PT;Supervision for mobility/OOB           Equipment Recommendations  Rolling walker with 5" wheels or 4 wheel walker (rollater)       Frequency Min 3X/week   Plan Discharge plan remains appropriate    Precautions / Restrictions Precautions Precaution Comments: thoracic compression fx (avoid excessive trunk flexion) Restrictions Weight Bearing Restrictions: No   Pertinent Vitals/Pain Pt denies pain    Mobility  Bed Mobility Bed Mobility: Sit to Supine;Supine to Sit Supine to Sit: HOB flat;6: Modified independent (Device/Increase time) Sit to Supine: 6: Modified independent (Device/Increase time) Transfers Transfers: Sit to Stand;Stand to Sit Sit to Stand: 5: Supervision Stand to Sit: 5: Supervision Details for Transfer Assistance: Pt requiring supervision do to impusivity. Ambulation/Gait Ambulation/Gait Assistance: 4: Min assist Ambulation Distance (Feet): 670 Feet Assistive device: Rolling walker;None Ambulation/Gait Assistance  Details: Ambulation initiated with rolling walker due to dynamic balance deficits, but pt refusing use after approximately 100 ft. Min assist for balance during DGI tasks. Gait Pattern: Decreased stance time - right;Step-through pattern Gait velocity: Pt moving quickly leading to balance deficits. Stairs: Yes Stairs Assistance: 5: Supervision Stair Management Technique: One rail Right;Step to pattern;Forwards Number of Stairs: 2 Naval architect Mobility: No Modified Rankin (Stroke Patients Only) Pre-Morbid Rankin Score: Slight disability Modified Rankin: Moderate disability    PT Goals Acute Rehab PT Goals PT Goal: Ambulate - Progress: Progressing toward goal  Visit Information  Last PT Received On: 12/21/12 Assistance Needed: +1    Subjective Data  Subjective: "I want this bed to stop beeping at me." Patient Stated Goal: go walking more   Cognition  Cognition Arousal/Alertness: Awake/alert Behavior During Therapy: WFL for tasks assessed/performed Overall Cognitive Status: History of cognitive impairments - at baseline  Pt demonstrating decreased safety awareness. Showing impulsive behavior during dynamic activities. Combination of poor safety awareness and decreased dynamic balance significantly increases fall risk.   Balance  Static Standing Balance Static Standing - Balance Support: No upper extremity supported;During functional activity Static Standing - Level of Assistance: 5: Stand by assistance Static Standing - Comment/# of Minutes: 1 minute Standardized Balance Assessment Standardized Balance Assessment: Timed Up and Go Test;Dynamic Gait Index Dynamic Gait Index Level Surface: Mild Impairment Change in Gait Speed: Mild Impairment Gait with Horizontal Head Turns: Mild Impairment Gait with Vertical Head Turns: Mild Impairment Gait and Pivot Turn: Mild Impairment Step Over Obstacle: Mild Impairment Step Around Obstacles: Severe Impairment. Pt  stepping on obstacle and tripping  Steps: Moderate Impairment Total Score: 13 Timed Up and Go Test TUG: Normal TUG Normal TUG (seconds): 15 (without  assistive device.) Pt tripping over obstacle at turning point trying to perform at a fast pace with impulsive tendencies.   End of Session PT - End of Session Equipment Utilized During Treatment: Gait belt Activity Tolerance: Patient tolerated treatment well Patient left: in bed;with call bell/phone within reach;with bed alarm set Nurse Communication: Mobility status   GP     Payton Doughty 12/21/2012, 10:55 AM Payton Doughty, PT Student

## 2012-12-21 NOTE — Evaluation (Signed)
Occupational Therapy Evaluation Patient Details Name: Barbara [name] MRN: [id number] DOB: 18-Jul-1929 Today's Date: 12/21/2012 Time: 4098-1191 OT Time Calculation (min): 16 min  OT Assessment / Plan / Recommendation Clinical Impression   79 yo female admitted from [facility] and with a hx of dementia. Pt presents with altered mental status, facial droop, expressive aphasia, slurred speech, right-sided weakness. Patient unable to give the history states she's not sure what happened but does state that she couldn't make any good sense for a while.  MRI reveals: .Asymmetrical left-sided MCA small vessel disease with significant proximal stenoses which may contribute to the areas of infarction. Ot to defer further treatment to ALF HHOT . Ot to sign off acutely      OT Assessment  All further OT needs can be met in the next venue of care    Follow Up Recommendations  Home health OT    Barriers to Discharge      Equipment Recommendations  None recommended by OT    Recommendations for Other Services    Frequency       Precautions / Restrictions Precautions Precautions: Fall Precaution Comments: thoracic compression fx (avoid excessive trunk flexion) Restrictions Weight Bearing Restrictions: No   Pertinent Vitals/Pain No pain    ADL  Grooming: Wash/dry hands;Teeth care;Supervision/safety Where Assessed - Grooming: Unsupported standing (cues for soap to wash hands) Toilet Transfer: Min Pension scheme manager Method: Sit to Barista: Regular height toilet;Grab bars Toileting - Clothing Manipulation and Hygiene: Minimal assistance Where Assessed - Glass blower/designer Manipulation and Hygiene: Sit to stand from 3-in-1 or toilet Transfers/Ambulation Related to ADLs: Pt ambulated to bathroom with min guard (A) attempting to walk with shorts partially don. Pt needed mod v/c to correct. Pt again attempting to ambulate with shorts not pulled up exiting  bathroom.  ADL Comments: Pt with daughters present in room. Pt ambulating in room without RW. Pt don all clothing at eOB. pt unable to hook bra correctly so daughters are going to purchase front closure bras for d/c. Pt s daughters state "it was helpful to see she was having trouble" Pt agreeable to new bra purchase. Pt completed toilet sink and static standing during session. Pt ambulated around room without RW to collect personal affects in preparation for d/c. Pt with visual deficits at baseline.    OT Diagnosis:    OT Problem List:   OT Treatment Interventions:     OT Goals    Visit Information  Last OT Received On: 12/21/12 Assistance Needed: +1    Subjective Data  Subjective: "we have to pack my stuff up now right" Patient Stated Goal: to return to [facility]   Prior Functioning     Home Living Lives With: Alone Type of Home: Assisted living Interior and spatial designer) Additional Comments: pt living at [facility] assisted living previously Prior Function Level of Independence: Independent with assistive device(s) Driving: No Vocation: Retired Musician: No difficulties Dominant Hand: Right         Vision/Perception Vision - History Baseline Vision: Wears glasses all the time Visual History: Glaucoma;Macular degeneration Patient Visual Report: No change from baseline   Cognition  Cognition Arousal/Alertness: Awake/alert Behavior During Therapy: WFL for tasks assessed/performed Overall Cognitive Status: History of cognitive impairments - at baseline    Extremity/Trunk Assessment Right Upper Extremity Assessment RUE ROM/Strength/Tone: Deficits RUE ROM/Strength/Tone Deficits: shoulder flexion ~90 RUE Sensation: WFL - Light Touch RUE Coordination: WFL - gross/fine motor (arthritis noted) Left Upper Extremity Assessment LUE ROM/Strength/Tone: Deficits  LUE ROM/Strength/Tone Deficits: shoulder flexion ~90 degrees LUE Coordination: WFL - gross motor Trunk  Assessment Trunk Assessment: Kyphotic     Mobility Bed Mobility Bed Mobility: Supine to Sit;Sitting - Scoot to Edge of Bed Supine to Sit: 6: Modified independent (Device/Increase time);HOB flat Sitting - Scoot to Edge of Bed: 6: Modified independent (Device/Increase time) Sit to Supine: 6: Modified independent (Device/Increase time) Transfers Sit to Stand: 5: Supervision;With upper extremity assist;From bed Stand to Sit: 5: Supervision;With upper extremity assist;To bed Details for Transfer Assistance: daughters present so alarm not set at this time. Pt smiling and pleasant about not having an alarm     Exercise     Balance Static Standing Balance Pt static standing >3 minutes MOD I- pt stood at sink for adl with good static standing   End of Session OT - End of Session Activity Tolerance: Patient tolerated treatment well Patient left: in bed;with call bell/phone within reach;with family/visitor present Nurse Communication: Mobility status;Precautions  GO     Lucile Shutters 12/21/2012, 2:24 PM Pager: [phone]

## 2012-12-21 NOTE — Discharge Summary (Signed)
Physician Discharge Summary  Barbara [name] BMW:[id number] DOB: 12-30-1928 DOA: 12/19/2012  PCP: Kimber Relic, MD  Admit date: 12/19/2012 Discharge date: 12/21/2012  Time spent: >7minutes  Recommendations for Outpatient Follow-up:      Follow-up Information   Please follow up. (SNF MD in 1-2days)       Follow up with Gates Rigg, MD. (in 3-4wks, call for appt upon discharge)    Contact information:   [address] [place] [address] [phone]       Follow up with Donato Schultz, MD. (in 2-4wks, call for appt upon discharge)    Contact information:   [address] [place] [address] [phone]        Discharge Diagnoses:  Principal Problem:   CVA (cerebral infarction) Active Problems:   Atrial fibrillation   Senile dementia, uncomplicated   Generalized anxiety disorder   Unspecified constipation   Dysarthria   Hemiparesis affecting dominant side as late effect of stroke   Discharge Condition: improved/stable  Diet recommendation: heart healthy  Filed Weights   12/19/12 2011 12/20/12 0449  Weight: 46.9 kg (103 lb 6.3 oz) 46.267 kg (102 lb)    History of present illness:  Barbara [name] is a 79 y.o. female with past medical history atrial fibrillation was to be started on ELIQUIS today, hyperlipidemia, history of hypothyroidism, and debilitated resident at [facility] who presents to the ED with several hour history of altered mental status, facial droop, expressive aphasia, slurred speech, right-sided weakness.  Patient unable to give the history states she's not sure what happened but does state that she couldn't make any good sense for a while.  History was obtained from ED physician's note.  Per ED note and periapical patient had presented to his PCPs office on followup secondary to altered mental status. Patient had seen a cardiologist on the morning of admission and was doing well on return to  [facility]. Per notes as patient's daughter the patient in the car she noted that patient had a right facial droop and difficulty getting her words out. Around 1:15 on the afternoon of admission he was noted by staff at the patient's facility that the patient did have some altered mental status, right-sided weakness, right facial weakness, slurred speech and dysarthria. CT of the head which was obtained on admission was negative. Patient denies any fever, no chills, no chest pain, no shortness of breath, no melena, no hematemesis, no hematochezia, no diarrhea, no constipation, no cough, no other associated symptoms. TPA was not given secondary to delay in presentation and symptomatic improvement.  Per notes patient was initially on Coumadin for her atrial fibrillation which was subsequently changed to [facility], however due to a drop in the hemoglobin, xaralto was discontinued and patient had been on an aspirin. Per her PCPs note patient's hemoglobin was low enough to require transfusion on 11/30/2012. Patient had presented to cardiologist office on the morning of admission and was to be started on ELIQUIS. Patient had yet to take a dose.  Patient was seen by neurology the ED will also call to admit the patient.    Hospital Course:  #1 acute CVA  As discussed above,the patient presented outside the TPA window and symptoms were improving and as such TPA was not given. 2-D echo with EF of 60-65%. No cardiac source of emboli noted. Preliminary carotid Doppler is negative. LDL is 104, and she was Started on low dose Lipitor. Patient does have  a history of atrial fibrillation and was to be started on a eliquis on day of presentation, and continued during this hospital stay. PT/OT/ST was consulted and saw pt in the hospital and home health PT was recommended upon discharge back to the assisted living facility. Neurology was consulted and followed patient during this hospital stay and recommended to  continue anticoagulation with Eliquis. She is to follow up with neurology outpatient   #2 atrial fibrillation  She was maintained on Cardizem for rate control. Eliquis for anticoagulation as above. She is to followup with cardiology outpatient. #3 hypothyroidism  TSH was 0.033. Decrease Synthroid to 88 mcg daily.  #4 hyperlipidemia  LDL is 104, low dose Lipitor as above..  #5 dementia  Stable.    Consultants:  Neurology: Dr. Thad Ranger 12/19/2012 Procedures:  CT of the head 12/19/2012  MRI of the head 12/20/2012  2-D echo 12/20/2012  Carotid Dopplers 12/20/2012   Discharge Exam: Filed Vitals:   12/20/12 2121 12/21/12 0145 12/21/12 0519 12/21/12 1020  BP: 116/48 114/69 135/69 135/84  Pulse: 72 80 63 85  Temp: 97.6 F (36.4 C) 98.3 F (36.8 C) 97.7 F (36.5 C) 97.6 F (36.4 C)  TempSrc: Oral Oral Oral Oral  Resp: 20 20 20 20   Height:      Weight:      SpO2: 97% 97% 99% 100%      Discharge Instructions  Discharge Orders   Future Appointments Provider Department Dept Phone   12/26/2012 1:30 PM Kimber Relic, MD [facility] [phone]   02/22/2013 1:00 PM Claudette Royston Sinner, NP [facility] [phone]   Future Orders Complete By Expires     Diet - low sodium heart healthy  As directed     Increase activity slowly  As directed         Medication List    TAKE these medications       acetaminophen 325 MG tablet  Commonly known as:  TYLENOL  Take 650 mg by mouth 2 (two) times daily.     atorvastatin 10 MG tablet  Commonly known as:  LIPITOR  Take 1 tablet (10 mg total) by mouth daily at 6 PM.     CALTRATE 600+D 600-400 MG-UNIT per chew tablet  Generic drug:  Calcium Carbonate-Vitamin D  Chew 1 tablet by mouth daily.     cholecalciferol 1000 UNITS tablet  Commonly known as:  VITAMIN D  Take 1,000 Units by mouth daily.     diltiazem 360 MG 24 hr capsule  Commonly known as:  TIAZAC  Take 360 mg by mouth daily.     dorzolamide-timolol  22.3-6.8 MG/ML ophthalmic solution  Commonly known as:  COSOPT  Place 1 drop into both eyes 2 (two) times daily.     ELIQUIS 2.5 MG Tabs tablet  Generic drug:  apixaban  Take 2.5 mg by mouth 2 (two) times daily.     iron polysaccharides 150 MG capsule  Commonly known as:  NIFEREX  Take 150 mg by mouth daily.     levothyroxine 100 MCG tablet  Commonly known as:  SYNTHROID, LEVOTHROID  Take 100 mcg by mouth daily before breakfast.     MUSCLE RUB 10-15 % Crea  Apply 1 application topically as needed (Apply at bedtime and as needed to reduce hip /back pain).     OPTIVE 0.5-0.9 % Soln  Generic drug:  Carboxymethylcellul-Glycerin  Apply 1 drop to eye 3 (three) times daily.     polyethylene glycol packet  Commonly known as:  MIRALAX / GLYCOLAX  Take 17 g by mouth daily. Mix with 6oz beverage of choice. Hold for loose stool     PreserVision/Lutein Caps  Take 2 capsules by mouth daily.     senna-docusate 8.6-50 MG per tablet  Commonly known as:  Senokot-S  Take 1 tablet by mouth at bedtime as needed.     sertraline 50 MG tablet  Commonly known as:  ZOLOFT  Take 50 mg by mouth at bedtime.       No Known Allergies     Follow-up Information   Please follow up. (SNF MD in 1-2days)       Follow up with Gates Rigg, MD. (in 3-4wks, call for appt upon discharge)    Contact information:   [address] [place] [address] [phone]       Follow up with Donato Schultz, MD. (in 2-4wks, call for appt upon discharge)    Contact information:   [address] [place] [address] [phone]        The results of significant diagnostics from this hospitalization (including imaging, microbiology, ancillary and laboratory) are listed below for reference.    Significant Diagnostic Studies: Dg Chest 2 View  12/19/2012   *RADIOLOGY REPORT*  Clinical Data: Confusion, atrial fibrillation  CHEST - 2 VIEW  Comparison: Chest radiograph 04/29/2012   Findings: Stable enlarged cardiac silhouette.  Calcifications 19 of the heart are unchanged from prior.  There is however a rounded nodule projecting  over the heart measuring 12 mm which is not clearly seen on prior.  Lungs are hyperinflated.  There is flattened hemidiaphragms.  There is interval compression fracture of the mid thoracic vertebral body with approximately 80% loss of to body height.  This is new from 04/29/2012.  IMPRESSION: 1. No acute cardiopulmonary findings. 2.  Interval compression fracture of mid thoracic vertebral body. This is age indeterminate but  new from 04/29/2012. 3.  Rounded density projecting over the heart is not clearly seen on prior.  This may relate to cardiac calcifications or rib calcifications but cannot exclude a nodule.  Consider CT thorax for further evaluation.   Original Report Authenticated By: Genevive Bi, M.D.   Ct Head Wo Contrast  12/19/2012   *RADIOLOGY REPORT*  Clinical Data: Code stroke, slurred speech, facial droop, expressive aphasia  CT HEAD WITHOUT CONTRAST  Technique:  Contiguous axial images were obtained from the base of the skull through the vertex without contrast.  Comparison: Brain MRI - 10/15/2009  Findings:  Redemonstrated mild diffuse atrophy and centralized volume loss with commiserate mild ex vacuo dilatation of the ventricular system.  Grossly unchanged rather extensive near confluent periventricular hypodensities, left slightly greater than right. Old lacunar infarct within the right basal ganglia.  Given extensive background parenchymal abnormalities, there is no CT evidence of acute large territory infarct.  No intraparenchymal or extra-axial mass or hemorrhage.  Unchanged size and configuration of the ventricles and basilar cisterns.  No midline shift.  The paranasal sinuses and mastoid air cells are normal.  Regional soft tissues are normal.  No displaced calvarial fracture.  Post bilateral cataract surgery.  IMPRESSION: Advanced  atrophy and microvascular ischemic disease without acute intracranial process.  Above findings discussed with Dr. Thad Ranger at 14:24.   Original Report Authenticated By: Tacey Ruiz, MD   Mr Brain Wo Contrast  12/20/2012   *RADIOLOGY REPORT*  Clinical Data:  Right-sided weakness and aphasia which has since resolved.  MRI HEAD  WITHOUT CONTRAST MRA HEAD WITHOUT CONTRAST  Technique:  Multiplanar, multiecho pulse sequences of the brain and surrounding structures were obtained without intravenous contrast. Angiographic images of the head were obtained using MRA technique without contrast.  Comparison:  CT head without contrast 12/19/2012.  MRI brain without contrast 10/15/2009.  MRI HEAD  Findings:  An acute to focal non hemorrhagic infarct in the posterior left insular cortex measures 7 mm maximally.  There are two or three additional punctate areas of restricted diffusion in the posterior left frontal lobe and left parietal lobe.  Extensive periventricular subcortical white matter to the hyperintensity is evident bilaterally.  The no acute hemorrhage or mass lesion is present.  The ventricles are proportionate to the degree of atrophy.  No significant extra-axial fluid collection is present.  The patient is status post bilateral lens extractions.  The paranasal sinuses and mastoid air cells are clear.  IMPRESSION:  1.  Multifocal areas of acute non hemorrhagic infarcts in the posterior left MCA territory.  The largest area measures 6 mm along the posterior left insular cortex. 2.  Otherwise stable atrophy and extensive white matter disease. This is compatible with the sequelae of chronic microvascular ischemia.  MRA HEAD  Findings: Mild atherosclerotic irregularities are present within the cavernous carotid arteries bilaterally.  The A1 and M1 segments are normal.  No definite anterior communicating artery is patent. The MCA bifurcations are intact.  There is significant attenuation of MCA branch vessels  bilaterally, more prominently on the left. The vertebral arteries are codominant.  The left PICA origin is somewhat distorted by artifact.  The right AICA is dominant. Basilar artery is intact.  Both posterior cerebral arteries originate from basilar tip.  There is moderate attenuation of PCA branch vessels.  IMPRESSION:  1.  Moderate small vessel disease. 2.  Asymmetrical left-sided MCA small vessel disease with significant proximal stenoses which may contribute to the areas of infarction.  These results were called by telephone on 12/20/2012 at 01:25 p.m. to Dr. Janee Morn, who verbally acknowledged these results.   Original Report Authenticated By: Marin Roberts, M.D.   Mr Mra Head/brain Wo Cm  12/20/2012   *RADIOLOGY REPORT*  Clinical Data:  Right-sided weakness and aphasia which has since resolved.  MRI HEAD WITHOUT CONTRAST MRA HEAD WITHOUT CONTRAST  Technique:  Multiplanar, multiecho pulse sequences of the brain and surrounding structures were obtained without intravenous contrast. Angiographic images of the head were obtained using MRA technique without contrast.  Comparison:  CT head without contrast 12/19/2012.  MRI brain without contrast 10/15/2009.  MRI HEAD  Findings:  An acute to focal non hemorrhagic infarct in the posterior left insular cortex measures 7 mm maximally.  There are two or three additional punctate areas of restricted diffusion in the posterior left frontal lobe and left parietal lobe.  Extensive periventricular subcortical white matter to the hyperintensity is evident bilaterally.  The no acute hemorrhage or mass lesion is present.  The ventricles are proportionate to the degree of atrophy.  No significant extra-axial fluid collection is present.  The patient is status post bilateral lens extractions.  The paranasal sinuses and mastoid air cells are clear.  IMPRESSION:  1.  Multifocal areas of acute non hemorrhagic infarcts in the posterior left MCA territory.  The largest area  measures 6 mm along the posterior left insular cortex. 2.  Otherwise stable atrophy and extensive white matter disease. This is compatible with the sequelae of chronic microvascular ischemia.  MRA HEAD  Findings: Mild atherosclerotic  irregularities are present within the cavernous carotid arteries bilaterally.  The A1 and M1 segments are normal.  No definite anterior communicating artery is patent. The MCA bifurcations are intact.  There is significant attenuation of MCA branch vessels bilaterally, more prominently on the left. The vertebral arteries are codominant.  The left PICA origin is somewhat distorted by artifact.  The right AICA is dominant. Basilar artery is intact.  Both posterior cerebral arteries originate from basilar tip.  There is moderate attenuation of PCA branch vessels.  IMPRESSION:  1.  Moderate small vessel disease. 2.  Asymmetrical left-sided MCA small vessel disease with significant proximal stenoses which may contribute to the areas of infarction.  These results were called by telephone on 12/20/2012 at 01:25 p.m. to Dr. Janee Morn, who verbally acknowledged these results.   Original Report Authenticated By: Marin Roberts, M.D.    Microbiology: No results found for this or any previous visit (from the past 240 hour(s)).   Labs: Basic Metabolic Panel:  Recent Labs Lab 12/19/12 1427 12/19/12 1508 12/19/12 1600 12/20/12 0555 12/21/12 0904  NA 139 141  --  142 141  K 4.0 4.0  --  3.5 3.5  CL 105 108  --  126* 108  CO2 23  --   --  25 23  GLUCOSE 88 88  --  100* 111*  BUN 16 17  --  10 10  CREATININE 0.74 0.70  --  0.68 0.68  CALCIUM 10.0  --   --  9.0 9.1  MG  --   --  2.2  --   --    Liver Function Tests:  Recent Labs Lab 12/19/12 1427  AST 22  ALT 13  ALKPHOS 106  BILITOT 0.3  PROT 6.8  ALBUMIN 3.5   No results found for this basename: LIPASE, AMYLASE,  in the last 168 hours No results found for this basename: AMMONIA,  in the last 168  hours CBC:  Recent Labs Lab 12/19/12 1427 12/19/12 1508 12/20/12 0555  WBC 7.1  --  7.1  NEUTROABS 4.0  --   --   HGB 11.1* 12.2 11.8*  HCT 33.6* 36.0 37.1  MCV 79.4  --  81.0  PLT 231  --  271   Cardiac Enzymes:  Recent Labs Lab 12/19/12 1443  TROPONINI <0.30   BNP: BNP (last 3 results) No results found for this basename: PROBNP,  in the last 8760 hours CBG:  Recent Labs Lab 12/19/12 1424  GLUCAP 94       Signed:  Laiyla Slagel C  [facility] 12/21/2012, 12:57 PM

## 2012-12-21 NOTE — Care Management Note (Unsigned)
    Page 1 of 1   12/21/2012     2:04:50 PM   CARE MANAGEMENT NOTE 12/21/2012  Patient:  AZELEA, SEGUIN   Account Number:  [id number]  Date Initiated:  12/20/2012  Documentation initiated by:  [facility]  Subjective/Objective Assessment:   admitted with slurred speech, facial droop for CVA w/u  lives at [facility]     Action/Plan:   PT/OT evals-recommended HHPT   Anticipated DC Date:  12/24/2011   Anticipated DC Plan:  [facility] / [facility]  In-house referral  Clinical Social Worker      DC Planning Services  CM consult      Choice offered to / List presented to:             Status of service:  Completed, signed off Medicare Important Message given?   (If response is "NO", the following Medicare IM given date fields will be blank) Date Medicare IM given:   Date Additional Medicare IM given:    Discharge Disposition:  ASSISTED LIVING  Per UR Regulation:  Reviewed for med. necessity/level of care/duration of stay  If discussed at Long Length of Stay Meetings, dates discussed:    Comments:  12/21/12 Order and face to face for HHPT sent to [facility] where they will provide the therapy.Jacquelynn Cree RN, BSN, CCM

## 2012-12-21 NOTE — Progress Notes (Signed)
Stroke Team Progress Note  HISTORY Barbara [name] is an 79 y.o. female with known A-fib who was on Xeralto but recently was taken off this medication due to questionable GI bleed needing transfusion. Patient was picked up at nursing home by her daughter at about 10 AM. Initially there was no abnormalities noted, as they got to the car her daughter noted she had a right facial droop and having difficulty getting her thoughts out. They continued to shop during the day. At 1300 hours she was brought back to nursing home and daughter noted to staff she was not her normal self. EMS was called. Patient was brought to ED as a code stroke. She was out of the 4 hour window and her symptoms had improved at time of arrival. She continued to have right sided weakness and right facial droop.   Of note: She had been off Xarelto for three weeks due to question of GI bleed. Per daughter she has seen GI and they wanted to keep a watch on her prior to doing upper gI scope. In addition, she had seen cardiology today and was started on eliquis, she has not taken a dose as of this point.   Date last known well: 5.12.14  Time last known well: 10.00  NIHSS: 3  tPA Given: No: out of window and resolving symptoms, recent GIB requiring transfusion   SUBJECTIVE     OBJECTIVE Most recent Vital Signs: Filed Vitals:   12/20/12 1836 12/20/12 2121 12/21/12 0145 12/21/12 0519  BP: 130/83 116/48 114/69 135/69  Pulse:  72 80 63  Temp: 98.7 F (37.1 C) 97.6 F (36.4 C) 98.3 F (36.8 C) 97.7 F (36.5 C)  TempSrc: Oral Oral Oral Oral  Resp:  20 20 20   Height:      Weight:      SpO2: 97% 97% 97% 99%   CBG (last 3)   Recent Labs  12/19/12 1424  GLUCAP 94    IV Fluid Intake:   . sodium chloride 75 mL/hr at 12/21/12 0200    MEDICATIONS  . acetaminophen  650 mg Oral BID  . apixaban  2.5 mg Oral BID  . atorvastatin  10 mg Oral q1800  . diltiazem  360 mg Oral Daily  . dorzolamide-timolol  1 drop Both Eyes  BID  . iron polysaccharides  150 mg Oral Daily  . levothyroxine  88 mcg Oral QAC breakfast  . pantoprazole  40 mg Oral Q0600  . polyethylene glycol  17 g Oral Daily  . polyvinyl alcohol  1 drop Both Eyes TID  . sertraline  50 mg Oral QHS  . sodium chloride  3 mL Intravenous Q12H   PRN:  acetaminophen, acetaminophen, alum & mag hydroxide-simeth, ondansetron (ZOFRAN) IV, ondansetron, oxyCODONE, polyethylene glycol, senna-docusate, sorbitol  Diet:  Cardiac thin liquids Activity:  OOB with assist DVT Prophylaxis:  --  CLINICALLY SIGNIFICANT STUDIES Basic Metabolic Panel:  Recent Labs Lab 12/19/12 1427 12/19/12 1508 12/19/12 1600 12/20/12 0555  NA 139 141  --  142  K 4.0 4.0  --  3.5  CL 105 108  --  126*  CO2 23  --   --  25  GLUCOSE 88 88  --  100*  BUN 16 17  --  10  CREATININE 0.74 0.70  --  0.68  CALCIUM 10.0  --   --  9.0  MG  --   --  2.2  --    Liver Function Tests:  Recent Labs  Lab 12/19/12 1427  AST 22  ALT 13  ALKPHOS 106  BILITOT 0.3  PROT 6.8  ALBUMIN 3.5   CBC:  Recent Labs Lab 12/19/12 1427 12/19/12 1508 12/20/12 0555  WBC 7.1  --  7.1  NEUTROABS 4.0  --   --   HGB 11.1* 12.2 11.8*  HCT 33.6* 36.0 37.1  MCV 79.4  --  81.0  PLT 231  --  271   Coagulation:  Recent Labs Lab 12/19/12 1427  LABPROT 13.9  INR 1.08   Cardiac Enzymes:  Recent Labs Lab 12/19/12 1443  TROPONINI <0.30   Urinalysis:  Recent Labs Lab 12/19/12 1619  COLORURINE YELLOW  LABSPEC 1.010  PHURINE 7.5  GLUCOSEU NEGATIVE  HGBUR NEGATIVE  BILIRUBINUR NEGATIVE  KETONESUR NEGATIVE  PROTEINUR NEGATIVE  UROBILINOGEN 0.2  NITRITE NEGATIVE  LEUKOCYTESUR SMALL*   Lipid Panel    Component Value Date/Time   CHOL 177 12/20/2012 0555   TRIG 111 12/20/2012 0555   HDL 51 12/20/2012 0555   CHOLHDL 3.5 12/20/2012 0555   VLDL 22 12/20/2012 0555   LDLCALC 104* 12/20/2012 0555   HgbA1C  Lab Results  Component Value Date   HGBA1C 5.5 12/20/2012    Urine Drug Screen:      Component Value Date/Time   LABOPIA NONE DETECTED 12/19/2012 1619   COCAINSCRNUR NONE DETECTED 12/19/2012 1619   LABBENZ NONE DETECTED 12/19/2012 1619   AMPHETMU NONE DETECTED 12/19/2012 1619   THCU NONE DETECTED 12/19/2012 1619   LABBARB NONE DETECTED 12/19/2012 1619    Alcohol Level:  Recent Labs Lab 12/19/12 1427  ETH <11    Dg Chest 2 View 12/19/2012  No acute cardiopulmonary findings. Interval compression fracture of mid thoracic vertebral body. This is age indeterminate but  new from 04/29/2012. Rounded density projecting over the heart is not clearly seen on prior.  This may relate to cardiac calcifications or rib calcifications but cannot exclude a nodule.  Consider CT thorax for further evaluation.      Ct Head Wo Contrast 12/19/2012    Advanced atrophy and microvascular ischemic disease without acute intracranial process.     Mr Brain Wo Contrast 12/20/2012  MRI HEAD .  Multifocal areas of acute non hemorrhagic infarcts in the posterior left MCA territory.  The largest area measures 6 mm along the posterior left insular cortex. 2.  Otherwise stable atrophy and extensive white matter disease. This is compatible with the sequelae of chronic microvascular ischemia.   MRA HEAD  .  Moderate small vessel disease. 2.  Asymmetrical left-sided MCA s mall vessel disease with significant proximal stenoses which may contribute to the areas of infarction.    2D Echocardiogram  EF 60%, wall motion normal, no cardiac source of emboli was identified.  Carotid Doppler  Bilateral: No evidence of hemodynamically significant internal carotid artery stenosis. Vertebral artery flow is antegrade.   EKG  atrial fibrillation, rate 70.     Therapy Recommendations     Physical Exam   Pleasant elderly caucasian lady not in distress.Awake alert. Afebrile. Head is nontraumatic. Neck is supple without bruit. Hearing is normal. Cardiac exam no murmur or gallop. Lungs are clear to auscultation. Distal  pulses are well felt. Mental Status:  Alert, oriented to hospital but stated she was 80, able to follow commands. Speech fluent --initially able to make compressible sentences but on second examination she showed intermittent expressive difficulties. Able to follow 3 step commands without difficulty.  Cranial Nerves:  II: Discs flat bilaterally; Visual  fields shows decreased bilateral peripheral vision (known macular degeneration), pupils equal, round, reactive to light and accommodation  III,IV, VI: ptosis not present, extra-ocular motions intact bilaterally  V,VII: smile asymmetric on the right, facial light touch sensation decreased on the right  VIII: hearing normal bilaterally  IX,X: gag reflex present  XI: bilateral shoulder shrug  XII: midline tongue extension  Motor:  Right : Upper extremity 4/5 Left: Upper extremity 5/5  Lower extremity 4/5 Lower extremity 5/5  Tone and bulk:normal tone throughout; no atrophy noted  Sensory: Pinprick and light touch intact throughout, bilaterally with no extinction  Deep Tendon Reflexes: 2+ and symmetric throughout  Plantars:  Right: downgoing Left: downgoing  Cerebellar:  normal finger-to-nose, normal heel-to-shin test  CV: pulses palpable throughout     ASSESSMENT Barbara [name] is a 79 y.o. female presenting with right facial droop and right hemiparesis. Imaging confirms an acute non hemorrhagic infarcts in the posterior left MCA territory. Infarct felt to be embolic due to known atrial fibrillation.  On xarelto prior to admission. Now on eliquis for secondary stroke prevention. Patient with resultant mild right hemiparesis.   Atrial fibrillation  Senile dementia  Vitamin d deficiency  Long term use of anticoagulants  Hyperlipidemia  Statin intolerance   Hospital day # 2  TREATMENT/PLAN  Continue eliquis for secondary stroke prevention.  Risk factor modification  Followup with Dr. Pearlean Brownie in 2 months. Stroke service  to sign off.  Paient unable to tolerate lipid lowering agents such as statins, but can tolerate fish oil and flax seed and we have asked her to restart these to impact and lower her LDL readings.  Gwendolyn Lima. Manson Passey, [facility], MBA, MHA [facility] Pager: [phone] 12/21/2012 10:10 AM  I have personally obtained a history, examined the patient, evaluated imaging results, and formulated the assessment and plan of care. I agree with the above. Delia Heady, MD

## 2012-12-21 NOTE — Progress Notes (Signed)
Occupational Therapy Discharge Patient Details Name: Barbara [name] MRN: [id number] DOB: 15-Feb-1929 Today's Date: 12/21/2012 Time: 7829-5621 OT Time Calculation (min): 16 min  Patient discharged from OT services secondary to goals met and no further OT needs identified.  Please see latest therapy progress note for current level of functioning and progress toward goals.    Progress and discharge plan discussed with patient and/or caregiver: Patient/Caregiver agrees with plan  GO     Lucile Shutters Pager: [phone]  12/21/2012, 2:34 PM

## 2012-12-22 ENCOUNTER — Encounter: Payer: Self-pay | Admitting: Geriatric Medicine

## 2012-12-22 ENCOUNTER — Non-Acute Institutional Stay: Payer: Medicare PPO | Admitting: Geriatric Medicine

## 2012-12-22 DIAGNOSIS — I6350 Cerebral infarction due to unspecified occlusion or stenosis of unspecified cerebral artery: Secondary | ICD-10-CM

## 2012-12-22 DIAGNOSIS — D649 Anemia, unspecified: Secondary | ICD-10-CM

## 2012-12-22 DIAGNOSIS — E039 Hypothyroidism, unspecified: Secondary | ICD-10-CM | POA: Insufficient documentation

## 2012-12-22 DIAGNOSIS — I639 Cerebral infarction, unspecified: Secondary | ICD-10-CM

## 2012-12-22 DIAGNOSIS — Z7901 Long term (current) use of anticoagulants: Secondary | ICD-10-CM

## 2012-12-22 NOTE — Progress Notes (Signed)
Patient ID: Barbara [name], female   DOB: 02/08/1929, 79 y.o.   MRN: [id number] [facility] [phone])  Chief Complaint  Patient presents with  . Hospitalization Follow-up    CVA    HPI: This 79 year old female resident of [facility], Assisted Living section, was sent to the hospital due to onset of garbled speech and right-sided weakness. These neurologic changes occurred slowly over the previous few hours. Patient has known chronic atrial fibrillation. She had a drop in her hemoglobin a little over a month ago. Xarelto was stopped, and she has been on aspirin only since that time. Hemoglobin was low enough to require transfusion 11/30/12.  ED evaluation included CT scan of the head which was negative. Patient was not treated with TPA as onset of symptoms was outside of treatment window. Patient was admitted for further evaluation and observation. MRA MRI brain performed on May 13 revealed evidence of cerebral infarction in the left MCA territory. Patient's symptoms resolved, anticoagulation with Eliquis was initiated. PT evaluation was completed, pt demonstrated generalized weakness / unsteady gait.  Outpatient PT was recommended. Patient was medically stable and ready for discharge to her Assisted-living apartment on May 14. Pt. Tells me she was feeling dizzy yesterday, better today. Still feels a bit unsteady  Bathing: Stand by Assist, Bladder Management: Continent, Bowel Management: Continent, Feeding: Independent, Toileting / Clothing: Independent, Walk: Independent  Allergies No Known Allergies Medications  Current outpatient prescriptions:acetaminophen (TYLENOL) 325 MG tablet, Take 650 mg by mouth 2 (two) times daily., Disp: , Rfl: ;  apixaban (ELIQUIS) 2.5 MG TABS tablet, Take 2.5 mg by mouth 2 (two) times daily., Disp: , Rfl: ;  atorvastatin (LIPITOR) 10 MG tablet, Take 1 tablet (10 mg total) by mouth daily at 6 PM., Disp: , Rfl:  Calcium  Carbonate-Vitamin D (CALTRATE 600+D) 600-400 MG-UNIT per chew tablet, Chew 1 tablet by mouth daily., Disp: , Rfl: ;  Carboxymethylcellul-Glycerin (OPTIVE) 0.5-0.9 % SOLN, Apply 1 drop to eye 3 (three) times daily., Disp: , Rfl: ;  cholecalciferol (VITAMIN D) 1000 UNITS tablet, Take 1,000 Units by mouth daily., Disp: , Rfl: ;  diltiazem (TIAZAC) 360 MG 24 hr capsule, Take 360 mg by mouth daily., Disp: , Rfl:  dorzolamide-timolol (COSOPT) 22.3-6.8 MG/ML ophthalmic solution, Place 1 drop into both eyes 2 (two) times daily., Disp: , Rfl: ;  iron polysaccharides (NIFEREX) 150 MG capsule, Take 150 mg by mouth daily., Disp: , Rfl: ;  levothyroxine (SYNTHROID, LEVOTHROID) 100 MCG tablet, Take 100 mcg by mouth daily before breakfast., Disp: , Rfl:  Menthol-Methyl Salicylate (MUSCLE RUB) 10-15 % CREA, Apply 1 application topically as needed (Apply at bedtime and as needed to reduce hip /back pain). , Disp: , Rfl: ;  Multiple Vitamins-Minerals (PRESERVISION/LUTEIN) CAPS, Take 2 capsules by mouth daily., Disp: , Rfl: ;  polyethylene glycol (MIRALAX / GLYCOLAX) packet, Take 17 g by mouth daily. Mix with 6oz beverage of choice. Hold for loose stool, Disp: , Rfl:  senna-docusate (SENOKOT-S) 8.6-50 MG per tablet, Take 1 tablet by mouth at bedtime as needed., Disp: , Rfl: ;  sertraline (ZOLOFT) 50 MG tablet, Take 50 mg by mouth at bedtime., Disp: , Rfl:   Data Reviewed    Radiologic Exams:  Reviewed: 12/19/12 CT  Head       12/20/2012 MRI/MRA Brain: acute non hemorrhagic infarcts in the posterior left MCA territory  12/21/2102 2D Echocardiogram: No cardiac source of emboli    Lab: [facility], external 08/18/2012 WBC 6.4, hemoglobin 12.3,  hematocrit 35.9 platelet 285 11/11/2012 WBC 8.7.9, hemoglobin 8.9 hematocrit 28.1, platelet 197 11/23/2012 WBC 7.0, hemoglobin 7.5, hematocrit 23.9, platelets 380 12/02/2012 WBC 7.2 hemoglobin 11.2, hematocrit 34.4, platelets 313  Hospital Lab Results for orders placed during the  hospital encounter of 12/19/12 (from the past 72 hour(s))  BASIC METABOLIC PANEL     Status: Abnormal   Collection Time    12/20/12  5:55 AM      Result Value Range   Sodium 142  135 - 145 mEq/L   Potassium 3.5  3.5 - 5.1 mEq/L   Chloride 126 (*) 96 - 112 mEq/L   CO2 25  19 - 32 mEq/L   Glucose, Bld 100 (*) 70 - 99 mg/dL   BUN 10  6 - 23 mg/dL   Creatinine, Ser 4.09  0.50 - 1.10 mg/dL   Calcium 9.0  8.4 - 81.1 mg/dL   GFR calc non Af Amer 80 (*) >90 mL/min   GFR calc Af Amer >90  >90 mL/min   Comment:            The eGFR has been calculated     using the CKD EPI equation.     This calculation has not been     validated in all clinical     situations.     eGFR's persistently     <90 mL/min signify     possible Chronic Kidney Disease.  CBC     Status: Abnormal   Collection Time    12/20/12  5:55 AM      Result Value Range   WBC 7.1  4.0 - 10.5 K/uL   RBC 4.58  3.87 - 5.11 MIL/uL   Hemoglobin 11.8 (*) 12.0 - 15.0 g/dL   HCT 91.4  78.2 - 95.6 %   MCV 81.0  78.0 - 100.0 fL   MCH 25.8 (*) 26.0 - 34.0 pg   MCHC 31.8  30.0 - 36.0 g/dL   RDW 21.3 (*) 08.6 - 57.8 %   Platelets 271  150 - 400 K/uL  TSH     Status: Abnormal   Collection Time    12/20/12  5:55 AM      Result Value Range   TSH 0.033 (*) 0.350 - 4.500 uIU/mL  HEMOGLOBIN A1C     Status: None   Collection Time    12/20/12  5:55 AM      Result Value Range   Hemoglobin A1C 5.5  <5.7 %   Comment: (NOTE)                                                                               According to the ADA Clinical Practice Recommendations for 2011, when     HbA1c is used as a screening test:      >=6.5%   Diagnostic of Diabetes Mellitus               (if abnormal result is confirmed)     5.7-6.4%   Increased risk of developing Diabetes Mellitus     References:Diagnosis and Classification of Diabetes Mellitus,Diabetes     Care,2011,34(Suppl 1):S62-S69 and Standards of Medical Care in  Diabetes - 2011,Diabetes  Care,2011,34 (Suppl 1):S11-S61.   Mean Plasma Glucose 111  <117 mg/dL  LIPID PANEL     Status: Abnormal   Collection Time    12/20/12  5:55 AM      Result Value Range   Cholesterol 177  0 - 200 mg/dL   Triglycerides 161  <096 mg/dL   HDL 51  >04 mg/dL   Total CHOL/HDL Ratio 3.5     VLDL 22  0 - 40 mg/dL   LDL Cholesterol 540 (*) 0 - 99 mg/dL   Comment:            Total Cholesterol/HDL:CHD Risk     Coronary Heart Disease Risk Table                         Men   Women      1/2 Average Risk   3.4   3.3      Average Risk       5.0   4.4      2 X Average Risk   9.6   7.1      3 X Average Risk  23.4   11.0                Use the calculated Patient Ratio     above and the CHD Risk Table     to determine the patient's CHD Risk.                ATP III CLASSIFICATION (LDL):      <100     mg/dL   Optimal      981-191  mg/dL   Near or Above                        Optimal      130-159  mg/dL   Borderline      478-295  mg/dL   High      >621     mg/dL   Very High  URINE CULTURE     Status: None   Collection Time    12/20/12  6:47 AM      Result Value Range   Specimen Description URINE, CATHETERIZED     Special Requests NONE     Culture  Setup Time 12/20/2012 07:14     Colony Count 15,000 COLONIES/ML     Culture       Value: Multiple bacterial morphotypes present, none predominant. Suggest appropriate recollection if clinically indicated.   Report Status 12/21/2012 FINAL    BASIC METABOLIC PANEL     Status: Abnormal   Collection Time    12/21/12  9:04 AM      Result Value Range   Sodium 141  135 - 145 mEq/L   Potassium 3.5  3.5 - 5.1 mEq/L   Chloride 108  96 - 112 mEq/L   CO2 23  19 - 32 mEq/L   Glucose, Bld 111 (*) 70 - 99 mg/dL   BUN 10  6 - 23 mg/dL   Creatinine, Ser 3.08  0.50 - 1.10 mg/dL   Calcium 9.1  8.4 - 65.7 mg/dL   GFR calc non Af Amer 80 (*) >90 mL/min   GFR calc Af Amer >90  >90 mL/min   Comment:            The eGFR has been calculated     using the CKD EPI  equation.  This calculation has not been     validated in all clinical     situations.     eGFR's persistently     <90 mL/min signify     possible Chronic Kidney Disease.    Review of Systems  DATA OBTAINED: from patient, nurse, medical record GENERAL: Feels well No fevers, fatigue, change in appetite or weight SKIN: No itch, rash or open wounds EYES: No eye pain, dryness or itching  No change in vision EARS: No earache, tinnitus, change in hearing NOSE: No congestion, drainage or bleeding MOUTH/THROAT: No mouth or tooth pain  No difficulty chewing or swallowing RESPIRATORY: No cough, wheezing, SOB CARDIAC: No chest pain, palpitations  No edema. GI: No abdominal pain  No N/V/D or constipation  No heartburn or reflux  GU: No dysuria, frequency or urgency  No change in urine volume or character  MUSCULOSKELETAL: No joint pain, swelling or stiffness  No back pain  No muscle ache, pain, weakness  Gait is unsteady    NEUROLOGIC: No dizziness, fainting, headache No change in mental status.  PSYCHIATRIC: No feelings of anxiety, depression Sleeps well.  No behavior issue.   Physical Exam Filed Vitals:   12/22/12 2049  BP: 118/62  Pulse: 81  Temp: 96.5 F (35.8 C)  Resp: 20  Height: 5\' 1"  (1.549 m)  Weight: 103 lb (46.72 kg)  SpO2: 97%   Body mass index is 19.47 kg/(m^2).  GENERAL APPEARANCE: No acute distress, appropriately groomed, normal body habitus. Alert, pleasant, conversant. HEAD: Normocephalic, atraumatic EYES: Conjunctiva/lids clear. Pupils round, reactive.  NOSE: No deformity or discharge. MOUTH/THROAT: Lips w/o lesions. Oral mucosa, tongue moist, w/o lesion. Oropharynx w/o redness or lesions.  NECK: Supple, full ROM. No thyroid tenderness, enlargement or nodule LYMPHATICS: No head, neck or supraclavicular adenopathy RESPIRATORY: Breathing is even, unlabored. Lung sounds are clear and full.  CARDIOVASCULAR: Heart IRRR. No murmur or extra heart sounds  ARTERIAL: No  carotid bruit. .  VENOUS: No varicosities. No venous stasis skin changes  EDEMA: No peripheral or periorbital edema.  GASTROINTESTINAL: Abdomen is soft, non-tender, not distended w/ normal bowel sounds.  MUSCULOSKELETAL: Moves all extremities with full ROM, strength and tone. Back is without kyphosis, scoliosis or spinal process tenderness. Gait is unsteady NEUROLOGIC: Oriented to time, place, person. Cranial nerves 2-12 grossly intact, speech clear, no tremor.  PSYCHIATRIC: Mood and affect appropriate to situation  ASSESSMENT/PLAN  Anemia Anemia, Hgb 8.8 noted on routine lab early April 2014. Repeat lab with significant drop in hemoglobin to 7.5. Xarelto was stopped. Patient had constipation but no other GI symptoms.  Patient was transfused 2 units packed red blood cells, hemoglobin improved to greater than 11. Patient was evaluated by gastroenterology, Iron supplement was started. Hemoglobin at hospital discharge 11.8, patient's continue iron supplement for total of 6 days. We'll recheck CBC in one month  CVA (cerebral infarction) Patient with garbled speech and right-sided weakness on 12/19/2012. Hospital evaluation revealed CVA posterior left MCA territory. Sympotms resolved, Eliquis started for  stroke risk reduction re: Chronic AF. Patient noted with unsteady gait during hospitalization, PT recommended discharge.    Long term (current) use of anticoagulants Long-term anticoagulation for stroke risk reduction related to chronic atrial fibrillation. Pradaxa was stopped April 2014 due to low hemoglobin. This was to start on May 12 her cardiologist recommendation. Patient was admitted to hospital later that day the 12th with CVA. This was started during hospitalization this medication 2.5 mg b.i.d. Will follow CBC and BMP at intervals  Follow up: 12/26/12 [facility], Dr.Green  Luddie Boghosian T.Shaquera Ansley, NP-C 12/22/2012

## 2012-12-22 NOTE — Assessment & Plan Note (Addendum)
Anemia, Hgb 8.8 noted on routine lab early April 2014. Repeat lab with significant drop in hemoglobin to 7.5. Xarelto was stopped. Patient had constipation but no other GI symptoms.  Patient was transfused 2 units packed red blood cells, hemoglobin improved to greater than 11. Patient was evaluated by gastroenterology, Iron supplement was started. Hemoglobin at hospital discharge 11.8, patient's continue iron supplement for total of 6 days. We'll recheck CBC in one month

## 2012-12-22 NOTE — Assessment & Plan Note (Signed)
Long-term anticoagulation for stroke risk reduction related to chronic atrial fibrillation. Pradaxa was stopped April 2014 due to low hemoglobin. This was to start on May 12 her cardiologist recommendation. Patient was admitted to hospital later that day the 12th with CVA. This was started during hospitalization this medication 2.5 mg b.i.d. Will follow CBC and BMP at intervals

## 2012-12-22 NOTE — Assessment & Plan Note (Signed)
Patient with garbled speech and right-sided weakness on 12/19/2012. Hospital evaluation revealed CVA posterior left MCA territory. Sympotms resolved, Eliquis started for  stroke risk reduction re: Chronic AF. Patient noted with unsteady gait during hospitalization, PT recommended discharge.

## 2012-12-26 ENCOUNTER — Non-Acute Institutional Stay: Payer: Medicare PPO | Admitting: Internal Medicine

## 2012-12-26 ENCOUNTER — Encounter: Payer: Self-pay | Admitting: Internal Medicine

## 2012-12-26 VITALS — BP 100/58 | HR 62 | Ht <= 58 in | Wt 103.0 lb

## 2012-12-26 DIAGNOSIS — D649 Anemia, unspecified: Secondary | ICD-10-CM

## 2012-12-26 DIAGNOSIS — M549 Dorsalgia, unspecified: Secondary | ICD-10-CM

## 2012-12-26 DIAGNOSIS — I639 Cerebral infarction, unspecified: Secondary | ICD-10-CM

## 2012-12-26 DIAGNOSIS — Z7901 Long term (current) use of anticoagulants: Secondary | ICD-10-CM

## 2012-12-26 DIAGNOSIS — E039 Hypothyroidism, unspecified: Secondary | ICD-10-CM

## 2012-12-26 DIAGNOSIS — I4891 Unspecified atrial fibrillation: Secondary | ICD-10-CM

## 2012-12-26 DIAGNOSIS — I6350 Cerebral infarction due to unspecified occlusion or stenosis of unspecified cerebral artery: Secondary | ICD-10-CM

## 2012-12-26 NOTE — Patient Instructions (Signed)
We will check on the cost of Prolia.

## 2012-12-26 NOTE — Progress Notes (Signed)
Patient ID: Barbara [name], female   DOB: Dec 29, 1928, 79 y.o.   MRN: [id number] [facility] [phone])  Chief Complaint  Patient presents with  . Medical Managment of Chronic Issues  . Hospitalization Follow-up    CVA    HPI: This 79 year old female resident of [facility], Assisted Living section, was sent to the hospital 12/19/12 due to onset of garbled speech and right-sided weakness. These neurologic changes occurred slowly over the previous few hours. Patient has known chronic atrial fibrillation. She had a drop in her hemoglobin a little over a month ago. Xarelto was stopped, and she has been on aspirin only since that time. Hemoglobin was low enough to require transfusion 11/30/12.   ED evaluation included CT scan of the head which was negative. Patient was not treated with TPA as onset of symptoms was outside of treatment window. Patient was admitted for further evaluation and observation. MRA MRI brain performed on May 13 revealed evidence of cerebral infarction in the left MCA territory. Patient's symptoms resolved. Anticoagulation with Eliquis was initiated. PT evaluation was completed, pt demonstrated generalized weakness / unsteady gait.  Outpatient PT was recommended. Patient was medically stable and ready for discharge to her Assisted-living apartment on Dec 21, 2012.  CXR in hospital showed evidence of a thoracic vertebra fracture that was new since Sept., 2013. She has some mild pain about T9.  Daughter would like to know what can be done to treat her OP. She took alendronate over 110 years. It was stopped quite a while ago. She continues on Ca++ supplements. Other options would include prolia or Forteo.  Hospital lab showed TSH 0.033. Levothyroxine has been reduced to 88 mcg.  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  Bathing: Stand by Assist, Bladder Management: Continent, Bowel Management: Continent,  Feeding: Independent, Toileting / Clothing: Independent, Walk: Independent  Allergies  Allergies  Allergen Reactions  . Aricept (Donepezil Hcl)     Muscle pain   Medications  Current Outpatient Prescriptions on File Prior to Visit  Medication Sig Dispense Refill  . acetaminophen (TYLENOL) 325 MG tablet Take 650 mg by mouth 2 (two) times daily.      Marland Kitchen apixaban (ELIQUIS) 2.5 MG TABS tablet Take 2.5 mg by mouth 2 (two) times daily.      Marland Kitchen atorvastatin (LIPITOR) 10 MG tablet Take 1 tablet (10 mg total) by mouth daily at 6 PM.      . Calcium Carbonate-Vitamin D (CALTRATE 600+D) 600-400 MG-UNIT per chew tablet Chew 1 tablet by mouth daily.      . Carboxymethylcellul-Glycerin (OPTIVE) 0.5-0.9 % SOLN Apply 1 drop to eye 3 (three) times daily.      . cholecalciferol (VITAMIN D) 1000 UNITS tablet Take 1,000 Units by mouth daily.      Marland Kitchen diltiazem (TIAZAC) 360 MG 24 hr capsule Take 360 mg by mouth daily.      . dorzolamide-timolol (COSOPT) 22.3-6.8 MG/ML ophthalmic solution Place 1 drop into both eyes 2 (two) times daily.      . iron polysaccharides (NIFEREX) 150 MG capsule Take 150 mg by mouth daily.      . Menthol-Methyl Salicylate (MUSCLE RUB) 10-15 % CREA Apply 1 application topically as needed (Apply at bedtime and as needed to reduce hip /back pain).       . Multiple Vitamins-Minerals (PRESERVISION/LUTEIN) CAPS Take 2 capsules by mouth daily.      . polyethylene glycol (MIRALAX / GLYCOLAX) packet Take  17 g by mouth daily. Mix with 6oz beverage of choice. Hold for loose stool      . senna-docusate (SENOKOT-S) 8.6-50 MG per tablet Take 1 tablet by mouth at bedtime as needed.      . sertraline (ZOLOFT) 50 MG tablet Take 50 mg by mouth at bedtime.       No current facility-administered medications on file prior to visit.     Data Reviewed    Radiologic Exams:  Reviewed: 12/19/12 CT  Head  12/19/12 CXR: no acute disease. Cardiomegaly present. Fx in mid thoracic spine of undertimed age, but new  since Sept 2013.       12/20/2012 MRI/MRA Brain: acute non hemorrhagic infarcts in the posterior left MCA territory  12/21/2102 2D Echocardiogram: No cardiac source of emboli    Lab: [facility], external 08/18/2012 WBC 6.4, hemoglobin 12.3, hematocrit 35.9 platelet 285 11/11/2012 WBC 8.7.9, hemoglobin 8.9 hematocrit 28.1, platelet 197 11/23/2012 WBC 7.0, hemoglobin 7.5, hematocrit 23.9, platelets 380 12/02/2012 WBC 7.2 hemoglobin 11.2, hematocrit 34.4, platelets 313  Hospital Lab Admission on 12/19/2012, Discharged on 12/21/2012  Component Date Value Range Status  . Alcohol, Ethyl (B) 12/19/2012 <11  0 - 11 mg/dL Final   Comment:                                 LOWEST DETECTABLE LIMIT FOR                          SERUM ALCOHOL IS 11 mg/dL                          FOR MEDICAL PURPOSES ONLY  . Prothrombin Time 12/19/2012 13.9  11.6 - 15.2 seconds Final  . INR 12/19/2012 1.08  0.00 - 1.49 Final  . aPTT 12/19/2012 31  24 - 37 seconds Final  . WBC 12/19/2012 7.1  4.0 - 10.5 K/uL Final  . RBC 12/19/2012 4.23  3.87 - 5.11 MIL/uL Final  . Hemoglobin 12/19/2012 11.1* 12.0 - 15.0 g/dL Final  . HCT 16/05/9603 33.6* 36.0 - 46.0 % Final  . MCV 12/19/2012 79.4  78.0 - 100.0 fL Final  . MCH 12/19/2012 26.2  26.0 - 34.0 pg Final  . MCHC 12/19/2012 33.0  30.0 - 36.0 g/dL Final  . RDW 54/04/8118 19.5* 11.5 - 15.5 % Final  . Platelets 12/19/2012 231  150 - 400 K/uL Final  . Neutrophils Relative % 12/19/2012 56  43 - 77 % Final  . Neutro Abs 12/19/2012 4.0  1.7 - 7.7 K/uL Final  . Lymphocytes Relative 12/19/2012 28  12 - 46 % Final  . Lymphs Abs 12/19/2012 2.0  0.7 - 4.0 K/uL Final  . Monocytes Relative 12/19/2012 14* 3 - 12 % Final  . Monocytes Absolute 12/19/2012 1.0  0.1 - 1.0 K/uL Final  . Eosinophils Relative 12/19/2012 2  0 - 5 % Final  . Eosinophils Absolute 12/19/2012 0.1  0.0 - 0.7 K/uL Final  . Basophils Relative 12/19/2012 0  0 - 1 % Final  . Basophils Absolute 12/19/2012 0.0  0.0 - 0.1 K/uL  Final  . Sodium 12/19/2012 139  135 - 145 mEq/L Final  . Potassium 12/19/2012 4.0  3.5 - 5.1 mEq/L Final  . Chloride 12/19/2012 105  96 - 112 mEq/L Final  . CO2 12/19/2012 23  19 - 32 mEq/L Final  .  Glucose, Bld 12/19/2012 88  70 - 99 mg/dL Final  . BUN 16/05/9603 16  6 - 23 mg/dL Final  . Creatinine, Ser 12/19/2012 0.74  0.50 - 1.10 mg/dL Final  . Calcium 54/04/8118 10.0  8.4 - 10.5 mg/dL Final  . Total Protein 12/19/2012 6.8  6.0 - 8.3 g/dL Final  . Albumin 14/78/2956 3.5  3.5 - 5.2 g/dL Final  . AST 21/30/8657 22  0 - 37 U/L Final  . ALT 12/19/2012 13  0 - 35 U/L Final  . Alkaline Phosphatase 12/19/2012 106  39 - 117 U/L Final  . Total Bilirubin 12/19/2012 0.3  0.3 - 1.2 mg/dL Final  . GFR calc non Af Amer 12/19/2012 78* >90 mL/min Final  . GFR calc Af Amer 12/19/2012 >90  >90 mL/min Final   Comment:                                 The eGFR has been calculated                          using the CKD EPI equation.                          This calculation has not been                          validated in all clinical                          situations.                          eGFR's persistently                          <90 mL/min signify                          possible Chronic Kidney Disease.  . Troponin I 12/19/2012 <0.30  <0.30 ng/mL Final   Comment:                                 Due to the release kinetics of cTnI,                          a negative result within the first hours                          of the onset of symptoms does not rule out                          myocardial infarction with certainty.                          If myocardial infarction is still suspected,                          repeat the test at appropriate intervals.  . Opiates 12/19/2012 NONE DETECTED  NONE DETECTED Final  . Cocaine 12/19/2012 NONE  DETECTED  NONE DETECTED Final  . Benzodiazepines 12/19/2012 NONE DETECTED  NONE DETECTED Final  . Amphetamines 12/19/2012 NONE DETECTED  NONE  DETECTED Final  . Tetrahydrocannabinol 12/19/2012 NONE DETECTED  NONE DETECTED Final  . Barbiturates 12/19/2012 NONE DETECTED  NONE DETECTED Final   Comment:                                 DRUG SCREEN FOR MEDICAL PURPOSES                          ONLY.  IF CONFIRMATION IS NEEDED                          FOR ANY PURPOSE, NOTIFY LAB                          WITHIN 5 DAYS.                                                          LOWEST DETECTABLE LIMITS                          FOR URINE DRUG SCREEN                          Drug Class       Cutoff (ng/mL)                          Amphetamine      1000                          Barbiturate      200                          Benzodiazepine   200                          Tricyclics       300                          Opiates          300                          Cocaine          300                          THC              50  . Color, Urine 12/19/2012 YELLOW  YELLOW Final  . APPearance 12/19/2012 CLEAR  CLEAR Final  . Specific Gravity, Urine 12/19/2012 1.010  1.005 - 1.030 Final  . pH 12/19/2012 7.5  5.0 - 8.0 Final  . Glucose, UA 12/19/2012 NEGATIVE  NEGATIVE mg/dL Final  . Hgb urine dipstick 12/19/2012 NEGATIVE  NEGATIVE Final  . Bilirubin Urine  12/19/2012 NEGATIVE  NEGATIVE Final  . Ketones, ur 12/19/2012 NEGATIVE  NEGATIVE mg/dL Final  . Protein, ur 11/91/4782 NEGATIVE  NEGATIVE mg/dL Final  . Urobilinogen, UA 12/19/2012 0.2  0.0 - 1.0 mg/dL Final  . Nitrite 95/62/1308 NEGATIVE  NEGATIVE Final  . Leukocytes, UA 12/19/2012 SMALL* NEGATIVE Final  . Glucose-Capillary 12/19/2012 94  70 - 99 mg/dL Final  . Sodium 65/78/4696 141  135 - 145 mEq/L Final  . Potassium 12/19/2012 4.0  3.5 - 5.1 mEq/L Final  . Chloride 12/19/2012 108  96 - 112 mEq/L Final  . BUN 12/19/2012 17  6 - 23 mg/dL Final  . Creatinine, Ser 12/19/2012 0.70  0.50 - 1.10 mg/dL Final  . Glucose, Bld 29/52/8413 88  70 - 99 mg/dL Final  . Calcium, Ion 24/40/1027 1.20  1.13  - 1.30 mmol/L Final  . TCO2 12/19/2012 27  0 - 100 mmol/L Final  . Hemoglobin 12/19/2012 12.2  12.0 - 15.0 g/dL Final  . HCT 25/36/6440 36.0  36.0 - 46.0 % Final  . Troponin i, poc 12/19/2012 0.00  0.00 - 0.08 ng/mL Final  . Comment 3 12/19/2012          Final   Comment: Due to the release kinetics of cTnI,                          a negative result within the first hours                          of the onset of symptoms does not rule out                          myocardial infarction with certainty.                          If myocardial infarction is still suspected,                          repeat the test at appropriate intervals.  . Squamous Epithelial / LPF 12/19/2012 MANY* RARE Final  . WBC, UA 12/19/2012 0-2  <3 WBC/hpf Final  . Magnesium 12/19/2012 2.2  1.5 - 2.5 mg/dL Final  . Specimen Description 12/20/2012 URINE, CATHETERIZED   Final  . Special Requests 12/20/2012 NONE   Final  . Culture  Setup Time 12/20/2012 12/20/2012 07:14   Final  . Colony Count 12/20/2012 15,000 COLONIES/ML   Final  . Culture 12/20/2012 Multiple bacterial morphotypes present, none predominant. Suggest appropriate recollection if clinically indicated.   Final  . Report Status 12/20/2012 12/21/2012 FINAL   Final  . Sodium 12/20/2012 142  135 - 145 mEq/L Final  . Potassium 12/20/2012 3.5  3.5 - 5.1 mEq/L Final  . Chloride 12/20/2012 126* 96 - 112 mEq/L Final  . CO2 12/20/2012 25  19 - 32 mEq/L Final  . Glucose, Bld 12/20/2012 100* 70 - 99 mg/dL Final  . BUN 34/74/2595 10  6 - 23 mg/dL Final  . Creatinine, Ser 12/20/2012 0.68  0.50 - 1.10 mg/dL Final  . Calcium 63/87/5643 9.0  8.4 - 10.5 mg/dL Final  . GFR calc non Af Amer 12/20/2012 80* >90 mL/min Final  . GFR calc Af Amer 12/20/2012 >90  >90 mL/min Final   Comment:  The eGFR has been calculated                          using the CKD EPI equation.                          This calculation has not been                           validated in all clinical                          situations.                          eGFR's persistently                          <90 mL/min signify                          possible Chronic Kidney Disease.  . WBC 12/20/2012 7.1  4.0 - 10.5 K/uL Final  . RBC 12/20/2012 4.58  3.87 - 5.11 MIL/uL Final  . Hemoglobin 12/20/2012 11.8* 12.0 - 15.0 g/dL Final  . HCT 16/05/9603 37.1  36.0 - 46.0 % Final  . MCV 12/20/2012 81.0  78.0 - 100.0 fL Final  . MCH 12/20/2012 25.8* 26.0 - 34.0 pg Final  . MCHC 12/20/2012 31.8  30.0 - 36.0 g/dL Final  . RDW 54/04/8118 19.4* 11.5 - 15.5 % Final  . Platelets 12/20/2012 271  150 - 400 K/uL Final  . TSH 12/20/2012 0.033* 0.350 - 4.500 uIU/mL Final  . Hemoglobin A1C 12/20/2012 5.5  <5.7 % Final   Comment: (NOTE)                                                                                                                         According to the ADA Clinical Practice Recommendations for 2011, when                          HbA1c is used as a screening test:                           >=6.5%   Diagnostic of Diabetes Mellitus                                    (if abnormal result is confirmed)                          5.7-6.4%   Increased risk of developing Diabetes Mellitus  References:Diagnosis and Classification of Diabetes Mellitus,Diabetes                          Care,2011,34(Suppl 1):S62-S69 and Standards of Medical Care in                                  Diabetes - 2011,Diabetes Care,2011,34 (Suppl 1):S11-S61.  . Mean Plasma Glucose 12/20/2012 111  <117 mg/dL Final  . Cholesterol 65/78/4696 177  0 - 200 mg/dL Final  . Triglycerides 12/20/2012 111  <150 mg/dL Final  . HDL 29/52/8413 51  >39 mg/dL Final  . Total CHOL/HDL Ratio 12/20/2012 3.5   Final  . VLDL 12/20/2012 22  0 - 40 mg/dL Final  . LDL Cholesterol 12/20/2012 104* 0 - 99 mg/dL Final   Comment:                                 Total Cholesterol/HDL:CHD Risk                           Coronary Heart Disease Risk Table                                              Men   Women                           1/2 Average Risk   3.4   3.3                           Average Risk       5.0   4.4                           2 X Average Risk   9.6   7.1                           3 X Average Risk  23.4   11.0                                                          Use the calculated Patient Ratio                          above and the CHD Risk Table                          to determine the patient's CHD Risk.                                                          ATP III CLASSIFICATION (LDL):                           <  100     mg/dL   Optimal                           100-129  mg/dL   Near or Above                                             Optimal                           130-159  mg/dL   Borderline                           160-189  mg/dL   High                           >190     mg/dL   Very High  . Sodium 12/21/2012 141  135 - 145 mEq/L Final  . Potassium 12/21/2012 3.5  3.5 - 5.1 mEq/L Final  . Chloride 12/21/2012 108  96 - 112 mEq/L Final  . CO2 12/21/2012 23  19 - 32 mEq/L Final  . Glucose, Bld 12/21/2012 111* 70 - 99 mg/dL Final  . BUN 91/47/8295 10  6 - 23 mg/dL Final  . Creatinine, Ser 12/21/2012 0.68  0.50 - 1.10 mg/dL Final  . Calcium 62/13/0865 9.1  8.4 - 10.5 mg/dL Final  . GFR calc non Af Amer 12/21/2012 80* >90 mL/min Final  . GFR calc Af Amer 12/21/2012 >90  >90 mL/min Final   Comment:                                 The eGFR has been calculated                          using the CKD EPI equation.                          This calculation has not been                          validated in all clinical                          situations.                          eGFR's persistently                          <90 mL/min signify                          possible Chronic Kidney Disease.  Hospital Outpatient Visit on 11/30/2012  Component  Date Value Range Status  . Order Confirmation 11/29/2012 ORDER PROCESSED BY BLOOD BANK   Final  . ABO/RH(D) 11/29/2012 O POS   Final  . Antibody Screen 11/29/2012 NEG   Final  . Sample Expiration 11/29/2012 12/02/2012   Final  . Unit Number 11/29/2012 [id number]  Final  . Blood Component Type 11/29/2012 RED CELLS,LR   Final  . Unit division 11/29/2012 00   Final  . Status of Unit 11/29/2012 ISSUED,FINAL   Final  . Transfusion Status 11/29/2012 OK TO TRANSFUSE   Final  . Crossmatch Result 11/29/2012 Compatible   Final  . Unit Number 11/29/2012 [id number]   Final  . Blood Component Type 11/29/2012 RED CELLS,LR   Final  . Unit division 11/29/2012 00   Final  . Status of Unit 11/29/2012 ISSUED,FINAL   Final  . Transfusion Status 11/29/2012 OK TO TRANSFUSE   Final  . Crossmatch Result 11/29/2012 Compatible   Final  . ABO/RH(D) 11/29/2012 O POS   Final     Review of Systems  DATA OBTAINED: from patient, nurse, medical record GENERAL: Feels well No fevers, fatigue, change in appetite or weight SKIN: No itch, rash or open wounds EYES: No eye pain, dryness or itching  No change in vision EARS: No earache, tinnitus, change in hearing NOSE: No congestion, drainage or bleeding MOUTH/THROAT: No mouth or tooth pain  No difficulty chewing or swallowing RESPIRATORY: No cough, wheezing, SOB CARDIAC: No chest pain, palpitations  No edema. GI: No abdominal pain  No N/V/D or constipation  No heartburn or reflux  GU: No dysuria, frequency or urgency  No change in urine volume or character  MUSCULOSKELETAL: No joint pain, swelling or stiffness  Mild pain  No muscle ache, pain, weakness  Gait is unsteady    NEUROLOGIC: No dizziness, fainting, headache No change in mental status. Chronic memory loss. PSYCHIATRIC: No feelings of anxiety, depression Sleeps well.  No behavior issue.   Physical Exam Filed Vitals:   12/26/12 1344  BP: 100/58  Pulse: 62  Height: 4' 8.5" (1.435 m)  Weight: 103 lb  (46.72 kg)   Body mass index is 22.69 kg/(m^2).  GENERAL APPEARANCE: No acute distress, appropriately groomed, normal body habitus. Alert, pleasant, conversant. SKIN: large right forearm ecchymosis HEAD: Normocephalic, atraumatic EYES: Conjunctiva/lids clear. Pupils round, reactive. Rx lenses. NOSE: No deformity or discharge. MOUTH/THROAT: Lips w/o lesions. Oral mucosa, tongue moist, w/o lesion. Oropharynx w/o redness or lesions.  NECK: Supple, full ROM. No thyroid tenderness, enlargement or nodule LYMPHATICS: No head, neck or supraclavicular adenopathy RESPIRATORY: Breathing is even, unlabored. Lung sounds are clear and full.  CARDIOVASCULAR: Heart IRRR. No murmur or extra heart sounds  ARTERIAL: No carotid bruit. .  VENOUS: No varicosities. No venous stasis skin changes  EDEMA: No peripheral or periorbital edema.  GASTROINTESTINAL: Abdomen is soft, non-tender, not distended w/ normal bowel sounds.  MUSCULOSKELETAL: Moves all extremities with full ROM, strength and tone. Mmoderate kyphosis. Mild tenderness to percussion in the mid TS. Gait is unsteady NEUROLOGIC: Oriented to time, place, person. Cranial nerves 2-12 grossly intact, speech clear, no tremor.  PSYCHIATRIC: Mood and affect appropriate to situation  ASSESSMENT/PLAN  1. CVA (cerebral infarction) Resolved without residual deficit  2. Anemia recheck lab  3. Long term (current) use of anticoagulants Now on apixaban 2.5 bid  4. Atrial fibrillation Rate controlled  5. Back pain mild   Follow up: July 2014 with C.Krell and in Sept 2014 with A. Chilton Si  12/26/2012

## 2013-02-22 ENCOUNTER — Encounter: Payer: Self-pay | Admitting: Geriatric Medicine

## 2013-03-08 ENCOUNTER — Encounter: Payer: Self-pay | Admitting: Geriatric Medicine

## 2013-03-08 ENCOUNTER — Non-Acute Institutional Stay: Payer: Medicare PPO | Admitting: Geriatric Medicine

## 2013-03-08 VITALS — BP 110/62 | HR 72 | Ht <= 58 in | Wt 101.0 lb

## 2013-03-08 DIAGNOSIS — I4891 Unspecified atrial fibrillation: Secondary | ICD-10-CM

## 2013-03-08 DIAGNOSIS — Z7901 Long term (current) use of anticoagulants: Secondary | ICD-10-CM

## 2013-03-08 DIAGNOSIS — D649 Anemia, unspecified: Secondary | ICD-10-CM

## 2013-03-08 DIAGNOSIS — E039 Hypothyroidism, unspecified: Secondary | ICD-10-CM

## 2013-03-08 DIAGNOSIS — M810 Age-related osteoporosis without current pathological fracture: Secondary | ICD-10-CM

## 2013-03-08 MED ORDER — DENOSUMAB 60 MG/ML ~~LOC~~ SOLN: 60.0000 mg | Freq: Once | SUBCUTANEOUS | Status: DC

## 2013-03-08 NOTE — Assessment & Plan Note (Signed)
Most recent TSH low, Synthroid dose reduced 02/23/2013. No symptoms of hyper thyroidism. Recheck TSH September 2014

## 2013-03-08 NOTE — Assessment & Plan Note (Signed)
Regular rhythm today, rate controlled. Continue current medications including anticoagulation.

## 2013-03-08 NOTE — Assessment & Plan Note (Signed)
Prolia has been recommended by Dr. Chilton Si, patient received injection today.

## 2013-03-08 NOTE — Assessment & Plan Note (Signed)
Patient taking a request for anticoagulation related to initial fibrillation and CVA. No adverse effects from medication, most recent CBC stayed satisfactory. Follow CBC and renal function at intervals, next check 04/2013

## 2013-03-08 NOTE — Progress Notes (Signed)
Patient ID: Barbara [name], female   DOB: November 25, 1928, 79 y.o.   MRN: [id number] [facility] [phone])  Code Status: DNR  Contact Information   Name Relation Home Work Mobile   [place] Daughter [phone]         Chief Complaint  Patient presents with  . Medical Managment of Chronic Issues    CVA , anemia, A-Fib, Osteoporosis     HPI: This is a 79 y.o. female resident of [facility],  Assisted Living section evaluated today for management of ongoing medical issues including anemia, atrial fibrillation, osteoporosis and recent CVA.  Review of record shows patient's vital signs have been stable, no acute issues. Recent labs showed TSH low, Synthroid dose was adjusted.    Patient reports she is feeling pretty well in general. Returned last week from a two-week vacation at the beach. She enjoyed this very much and she continues to take all medications as directed. Has completed a course of iron supplementation related to her anemia..  Bathing: Stand by Assist Bladder Management: Continent, Bowel Management: Continent, Feeding: Independent Toileting / Clothing: Independent, Walk: Independent     Allergies  Allergen Reactions  . Aricept (Donepezil Hcl)     Muscle pain   Medications Reviewed  DATA REVIEWED  Laboratory Studies:   [facility] 07/05/2012 CBC: Rbc 4.05, Hgb 12.7, Hct 36.6, Platelet 263                         CMP: Sodium 140, Potassium 3.9, glucose 167, BUN 17, Creatinine 0.78                         TSH 0.085 Vitamin B12  330 08/18/2012 CBC: Wbc 6.4, Rbc 4.02, Hgb 12.3, Hct 35.9, Platelet 285  11/23/2012 WBC 7.0, hemoglobin 7.5, hematocrit 23.9, platelets 380 12/02/2012 WBC 7.2 hemoglobin 11.2, hematocrit 34.4, platelets 313  01/24/2013 CBC 8.0, hemoglobin 11.9, hematocrit 36.6, Plt 246  02/07/2013 TSH 0.064     Review of Systems   DATA OBTAINED: from patient, medical record,  family member (daughter,  Selena Batten) GENERAL: Feels well   No fevers, fatigue, change in appetite or weight SKIN: No itch, rash or open wounds EYES: No eye pain, dryness or itching  No change in vision EARS: No earache, tinnitus, change in hearing NOSE: No congestion, drainage or bleeding MOUTH/THROAT: No mouth or tooth pain  No sore throat No difficulty chewing or swallowing RESPIRATORY: No cough, wheezing, SOB CARDIAC: No chest pain, palpitations  No edema. GI: No abdominal pain  No N/V/D or constipation  No heartburn or reflux  GU: No dysuria, frequency or urgency  No change in urine volume or character No nocturia or change in stream   Bladder prolapse MUSCULOSKELETAL: No joint pain, swelling or stiffness  Occasional back pain  No muscle ache, pain, weakness  Gait is steady  No recent falls.  NEUROLOGIC: No dizziness, fainting, headache  No change in mental status.  PSYCHIATRIC: No feelings of anxiety, depression Sleeps well.  No behavior issue.    Physical Exam Filed Vitals:   03/08/13 1408  BP: 110/62  Pulse: 72  Height: 4' 8.5" (1.435 m)  Weight: 101 lb (45.813 kg)   Body mass index is 22.25 kg/(m^2).  GENERAL APPEARANCE: No acute distress, appropriately groomed, normal body habitus. Alert, pleasant, conversant. SKIN: No diaphoresis, rash, unusual lesions, wounds HEAD: Normocephalic, atraumatic EYES: Conjunctiva/lids clear. Pupils round, reactive.  EARS:  Hearing grossly normal. NOSE: No deformity or discharge. MOUTH/THROAT: Lips w/o lesions. Oral mucosa, tongue moist, w/o lesion. Oropharynx w/o redness or lesions.  NECK: Supple, full ROM. No thyroid tenderness, enlargement or nodule LYMPHATICS: No head, neck or supraclavicular adenopathy RESPIRATORY: Breathing is even, unlabored. Lung sounds are clear and full.  CARDIOVASCULAR: Heart RRR. No murmur or extra heart sounds  ARTERIAL: No carotid bruit. Carotid, PT pulse 2+.  VENOUS: No varicosities. No venous stasis skin changes  EDEMA: No peripheral   edema.  GASTROINTESTINAL: Abdomen is soft, non-tender, not distended w/ normal bowel sounds.  MUSCULOSKELETAL: Moves all extremities with full ROM, strength and tone. Back with kyphosis, no scoliosis or spinal process tenderness. Gait is steady NEUROLOGIC: Oriented to time, place, person. Speech clear, no tremor.  PSYCHIATRIC: Mood and affect appropriate to situation  ASSESSMENT/PLAN  Anemia Most recent CBC satisfactory. Patient has completed course of iron supplement, continues Eliquis for anticoagulation. Recheck CBC September 2014.  Atrial fibrillation Regular rhythm today, rate controlled. Continue current medications including anticoagulation.  Long term (current) use of anticoagulants Patient taking a request for anticoagulation related to initial fibrillation and CVA. No adverse effects from medication, most recent CBC stayed satisfactory. Follow CBC and renal function at intervals, next check 04/2013  Unspecified hypothyroidism Most recent TSH low, Synthroid dose reduced 02/23/2013. No symptoms of hyper thyroidism. Recheck TSH September 2014  Senile osteoporosis Prolia has been recommended by Dr. Chilton Si, patient received injection today.    Follow up: As scheduled  Algis Lehenbauer T.Niang Mitcheltree, NP-C 03/08/2013

## 2013-03-08 NOTE — Assessment & Plan Note (Signed)
Most recent CBC satisfactory. Patient has completed course of iron supplement, continues Eliquis for anticoagulation. Recheck CBC September 2014.

## 2013-03-09 MED ORDER — DENOSUMAB 60 MG/ML ~~LOC~~ SOLN
60.0000 mg | Freq: Once | SUBCUTANEOUS | Status: AC
  Administered 2013-03-08: 60 mg via SUBCUTANEOUS

## 2013-04-06 LAB — TSH: TSH: 6.26 u[IU]/mL — AB (ref 0.41–5.90)

## 2013-05-01 ENCOUNTER — Encounter: Payer: Self-pay | Admitting: Internal Medicine

## 2013-05-29 ENCOUNTER — Non-Acute Institutional Stay: Payer: Medicare PPO | Admitting: Internal Medicine

## 2013-05-29 ENCOUNTER — Encounter: Payer: Self-pay | Admitting: Internal Medicine

## 2013-05-29 VITALS — BP 118/70 | HR 64 | Ht <= 58 in | Wt 105.0 lb

## 2013-05-29 DIAGNOSIS — H409 Unspecified glaucoma: Secondary | ICD-10-CM | POA: Insufficient documentation

## 2013-05-29 DIAGNOSIS — D649 Anemia, unspecified: Secondary | ICD-10-CM

## 2013-05-29 DIAGNOSIS — F411 Generalized anxiety disorder: Secondary | ICD-10-CM

## 2013-05-29 DIAGNOSIS — E785 Hyperlipidemia, unspecified: Secondary | ICD-10-CM

## 2013-05-29 DIAGNOSIS — E039 Hypothyroidism, unspecified: Secondary | ICD-10-CM

## 2013-05-29 DIAGNOSIS — I4891 Unspecified atrial fibrillation: Secondary | ICD-10-CM

## 2013-05-29 DIAGNOSIS — F0390 Unspecified dementia without behavioral disturbance: Secondary | ICD-10-CM

## 2013-05-29 DIAGNOSIS — M549 Dorsalgia, unspecified: Secondary | ICD-10-CM

## 2013-05-29 NOTE — Patient Instructions (Signed)
Continue current medication.

## 2013-05-29 NOTE — Progress Notes (Signed)
Subjective:    Patient ID: Barbara [name], female    DOB: [birth year], 79 y.o.   MRN: [id number]  Chief Complaint  Patient presents with  . Medical Managment of Chronic Issues    Follow-up on thyroid and anemia, with daughter Selena Batten     HPI Atrial fibrillation: NSR today. Remains on Eliquis  Unspecified hypothyroidism: stable  Senile dementia, uncomplicated:unchanged  Back pain: mainly in the thoracic area. Chronic. Improved since May 2014.  Anemia: resolved  Other and unspecified hyperlipidemia: controlled  Generalized anxiety disorder: improved. Sleeps well.    Current Outpatient Prescriptions on File Prior to Visit  Medication Sig Dispense Refill  . acetaminophen (TYLENOL) 325 MG tablet Take 650 mg by mouth 2 (two) times daily.      Marland Kitchen apixaban (ELIQUIS) 2.5 MG TABS tablet Take 2.5 mg by mouth 2 (two) times daily.      Marland Kitchen atorvastatin (LIPITOR) 10 MG tablet Take 1 tablet (10 mg total) by mouth daily at 6 PM.      . Calcium Carbonate-Vitamin D (CALTRATE 600+D) 600-400 MG-UNIT per chew tablet Chew 1 tablet by mouth daily.      . cholecalciferol (VITAMIN D) 1000 UNITS tablet Take 1,000 Units by mouth daily.      Marland Kitchen diltiazem (TIAZAC) 360 MG 24 hr capsule Take 360 mg by mouth daily.      . dorzolamide-timolol (COSOPT) 22.3-6.8 MG/ML ophthalmic solution Place 1 drop into both eyes 2 (two) times daily.      . Menthol-Methyl Salicylate (MUSCLE RUB) 10-15 % CREA Apply 1 application topically as needed (Apply at bedtime and as needed to reduce hip /back pain).       . Multiple Vitamins-Minerals (PRESERVISION/LUTEIN) CAPS Take 2 capsules by mouth daily.      . polyethylene glycol (MIRALAX / GLYCOLAX) packet Take 17 g by mouth daily. Mix with 6oz beverage of choice. Hold for loose stool      . senna-docusate (SENOKOT-S) 8.6-50 MG per tablet Take 1 tablet by mouth at bedtime as needed.      . sertraline (ZOLOFT) 50 MG tablet Take 50 mg by mouth at bedtime.      . travoprost,  benzalkonium, (TRAVATAN) 0.004 % ophthalmic solution 1 drop at bedtime.       No current facility-administered medications on file prior to visit.    Review of Systems  Constitutional: Positive for activity change. Negative for fever, chills, diaphoresis, appetite change and fatigue.  HENT: Positive for hearing loss.        Right facial weakness  Eyes:       Wears prescription lenses  Respiratory: Negative for cough, choking, chest tightness, shortness of breath, wheezing and stridor.   Cardiovascular: Negative for chest pain, palpitations and leg swelling.        History chronic atrial fibrillation  Gastrointestinal: Negative for abdominal pain and abdominal distention.  Endocrine: Negative.   Genitourinary:       Urgency and incontinence  Musculoskeletal:       New right-sided weakness.  Skin: Negative.   Allergic/Immunologic: Negative.   Neurological:       New-onset right facial weakness and right hemiparesis. Dementia.  Hematological:       Hx anemia requiring transfusion. No gross GI bleeding was noted. Hgb nl Aug 2014.  Psychiatric/Behavioral:       Chronic dementia       Objective:BP 118/70  Pulse 64  Ht 4' 8.5" (1.435 m)  Wt 105 lb (47.628 kg)  BMI 23.13  kg/m2    Physical Exam  Constitutional: She appears well-nourished. No distress.  Frail elderly female  HENT:  Head: Normocephalic and atraumatic.  Right Ear: External ear normal.  Nose: Nose normal.  Eyes: EOM are normal. Pupils are equal, round, and reactive to light.  Neck: Normal range of motion. Neck supple. No JVD present. No tracheal deviation present. No thyromegaly present.  Cardiovascular: Normal rate and regular rhythm.  Exam reveals no gallop and no friction rub.   Murmur heard. HxAtrial fibrillation; currently in NSR  Pulmonary/Chest: Effort normal and breath sounds normal. No respiratory distress. She has no wheezes. She has no rales. She exhibits no tenderness.  Abdominal: Soft. Bowel sounds  are normal. She exhibits no distension and no mass. There is no tenderness.  Musculoskeletal: She exhibits no edema and no tenderness.  Weakness of the right arm when sheattempts to extend it. Weak in the right leg when she attempts to raise against gravity.  Lymphadenopathy:    She has no cervical adenopathy.  Neurological: No cranial nerve deficit. Coordination normal.  Loss of memory  Skin: Skin is warm and dry. No rash noted. She is not diaphoretic. No erythema. No pallor.  Psychiatric: She has a normal mood and affect. Her behavior is normal.     LAB REVIEW 01/24/13 hgb 11.9 02/07/13 TSH 0.064 04/06/14 CBC: Hgb 12.6  CMP: nl  TSH 6.260     Assessment & Plan:  Atrial fibrillation: currently in NSR  Unspecified hypothyroidism: recheck lab  Senile dementia, uncomplicated: unchanged  Back pain: mainly in the thoracic area. Improved.  Anemia: resolved  Other and unspecified hyperlipidemia: controlled  Generalized anxiety disorder: improved

## 2013-07-04 ENCOUNTER — Encounter: Payer: Self-pay | Admitting: Geriatric Medicine

## 2013-07-04 ENCOUNTER — Non-Acute Institutional Stay: Payer: Medicare PPO | Admitting: Geriatric Medicine

## 2013-07-04 DIAGNOSIS — S51852A Open bite of left forearm, initial encounter: Secondary | ICD-10-CM

## 2013-07-04 DIAGNOSIS — S51851A Open bite of right forearm, initial encounter: Secondary | ICD-10-CM | POA: Insufficient documentation

## 2013-07-04 DIAGNOSIS — S51809A Unspecified open wound of unspecified forearm, initial encounter: Secondary | ICD-10-CM

## 2013-07-04 DIAGNOSIS — IMO0001 Reserved for inherently not codable concepts without codable children: Secondary | ICD-10-CM

## 2013-07-04 LAB — CBC AND DIFFERENTIAL
HCT: 38 % (ref 36–46)
Hemoglobin: 13.1 g/dL (ref 12.0–16.0)
Platelets: 206 10*3/uL (ref 150–399)
WBC: 6.2 10^3/mL

## 2013-07-04 NOTE — Progress Notes (Signed)
   Patient ID: Barbara [name], female   DOB: 03-04-1929, 79 y.o.   MRN: [id number]  [facility] (13)  Code Status: Full Code Contact Information   Name Relation Home Work Mobile   [name] Daughter [phone]         Chief Complaint  Patient presents with  . Animal Bite    HPI: This is a 79 y.o. female resident of [facility], [facility]isted [facility]   section.  Evaluation is requested today to evaluate cat bite sustained last evening. Another resident' scat bit the patient on her rt. Anterior wrist. She immediately sought help from nursing staff who cleaned/ bandaged the wound. The cat is up to date on all immunizations.    Allergies  Allergen Reactions  . Aricept [Donepezil Hcl]     Muscle pain   Medications Reviewed  DATA REVIEWED  Radiologic Exams:   Cardiovascular Exams:   Laboratory Studies:     Review of Systems  DATA OBTAINED: from patient GENERAL: Feels well   No fevers, fatigue, change in appetite or weight SKIN: No itch, rash  Cat bite right wrist MOUTH/THROAT: No mouth or tooth pain  No sore throat No difficulty chewing or swallowing RESPIRATORY: No cough, wheezing, SOB CARDIAC: No chest pain, palpitations  No edema. GI: No abdominal pain  No N/V/D or constipation   MUSCULOSKELETAL: No joint pain, swelling or stiffness  No back pain  No muscle ache, pain, weakness  Gait is steady  No recent falls.  NEUROLOGIC: No dizziness, fainting, headache  No change in mental status ( mild dementia).  PSYCHIATRIC: No feelings of anxiety, depression Sleeps well.   Physical Exam Filed Vitals:   07/04/13 1551  BP: 116/70  Pulse: 85  Temp: 96.5 F (35.8 C)   There is no weight on file to calculate BMI.  GENERAL APPEARANCE: No acute distress, appropriately groomed, normal body habitus. Alert, pleasant, conversant. SKIN: No diaphoresis, rash,  Small superficial appearing wound anterior rt. Wrist just above radial head.  Wound is clean, dry, no surrounding erythema or edema.  HEAD: Normocephalic, atraumatic EYES: Conjunctiva/lids clear..  RESPIRATORY: Breathing is even, unlabored.  CARDIOVASCULAR: Heart RRR. No murmur or extra heart sounds MUSCULOSKELETAL: Moves all extremities with full ROM, strength and tone. Back is without kyphosis, scoliosis or spinal process tenderness. Gait is steady NEUROLOGIC: Oriented to time, place, person. Speech clear, no tremor. PSYCHIATRIC: Mood and affect appropriate to situation  ASSESSMENT/PLAN  Cat bite of right forearm Rt. Anterior wrist with what appears to be a superficial cat bite, though cannot be sure about puncture depth. Wound is clean, no sign of infection. Nurse will continue to monitor, change dressing daily. If any sign of infection, will start PO antibiotic (Augmentin)   Follow up: AS scheduled in clinic or as needed  Versie Soave T.Genni Buske, NP-C 07/04/2013

## 2013-07-04 NOTE — Assessment & Plan Note (Signed)
Rt. Anterior wrist with what appears to be a superficial cat bite, though cannot be sure about puncture depth. Wound is clean, no sign of infection. Nurse will continue to monitor, change dressing daily. If any sign of infection, will start PO antibiotic (Augmentin)

## 2013-07-17 ENCOUNTER — Non-Acute Institutional Stay: Payer: Medicare PPO | Admitting: Geriatric Medicine

## 2013-07-17 ENCOUNTER — Encounter: Payer: Self-pay | Admitting: Geriatric Medicine

## 2013-07-17 DIAGNOSIS — M25511 Pain in right shoulder: Secondary | ICD-10-CM

## 2013-07-17 DIAGNOSIS — M25519 Pain in unspecified shoulder: Secondary | ICD-10-CM | POA: Insufficient documentation

## 2013-07-17 NOTE — Assessment & Plan Note (Signed)
Right shoulder pain and decreased range of motion the day after a fall. X-rays are pending. Patient does not appear to have any fractures. Recommend continued treatment with some rest and acetaminophen. Will review x-ray when available

## 2013-07-17 NOTE — Progress Notes (Signed)
   Patient ID: Barbara [name], female   DOB: [birth year], 79 y.o.   MRN: [id number]  [facility] (13)  Code Status: Full Code Contact Information   Name Relation Home Work Mobile   [place] Daughter [phone]         Chief Complaint  Patient presents with  . Shoulder Pain    HPI: This is a 79 y.o. female resident of [facility]isted [facility]  section.  Evaluation is requested today to 2 right shoulder pain after a fall yesterday. Patient had an unwitnessed fall outside, she reports that she tripped on something on the sidewalk. Fell facedown has abrasions to her chin lip and nose. No other pain at the time. This morning patient is complaining of right shoulder pain and right lateral rib pain.  X-ray of right shoulder girdle humerus and right ribs have been ordered.    Allergies  Allergen Reactions  . Aricept [Donepezil Hcl]     Muscle pain   Medications Reviewed  DATA REVIEWED  Radiologic Exams:   Cardiovascular Exams:   Laboratory Studies:     Review of Systems  DATA OBTAINED: from patient GENERAL: Feels well   No fevers, fatigue, change in appetite or weight SKIN: No itch, rash  Cat bite right wrist resolved  MOUTH/THROAT: No mouth or tooth pain  No sore throat No difficulty chewing or swallowing RESPIRATORY: No cough, wheezing, SOB CARDIAC: No chest pain, palpitations  No edema. GI: No abdominal pain  No N/V/D or constipation   MUSCULOSKELETAL: Shoulder was more painful earlier today not so bad right now No back pain  No muscle ache, pain, weakness  Gait is steady   NEUROLOGIC: No dizziness, fainting, headache  No change in mental status ( mild dementia).  PSYCHIATRIC: No feelings of anxiety, depression Sleeps well.   Physical Exam Filed Vitals:   07/17/13 1203  BP: 148/97  Pulse: 90  Temp: 98.6 F (37 C)  Resp: 18  SpO2: 97%   There is no weight on file to calculate BMI.  GENERAL APPEARANCE: No  acute distress, appropriately groomed, normal body habitus. Alert, pleasant, conversant. SKIN: No diaphoresis, rash,  ecchymosis or bright side the patient's chin, lower lip is edematous with small blister  HEAD: Normocephalic, atraumatic EYES: Conjunctiva/lids clear..  RESPIRATORY: Breathing is even, unlabored.  CARDIOVASCULAR: Heart RRR. No murmur or extra heart sounds MUSCULOSKELETAL: Decreased range of motion right shoulder, decreased in extension. Mildly tender anterior. No joint swelling, no shoulder or back or side body ecchymosis.  Back with mod. kyphosis, no coliosis or spinal process tenderness. Gait is steady, cautious NEUROLOGIC: Oriented to time, place, person. Speech clear, no tremor. PSYCHIATRIC: Mood and affect appropriate to situation  ASSESSMENT/PLAN  Pain in joint, shoulder region Right shoulder pain and decreased range of motion the day after a fall. X-rays are pending. Patient does not appear to have any fractures. Recommend continued treatment with some rest and acetaminophen. Will review x-ray when available   Follow up: As scheduled in clinic or as needed  Bracha Frankowski T.Stehanie Ekstrom, NP-C 07/17/2013

## 2013-08-06 ENCOUNTER — Encounter: Payer: Self-pay | Admitting: Cardiology

## 2013-08-14 ENCOUNTER — Encounter: Payer: Self-pay | Admitting: Cardiology

## 2013-08-14 ENCOUNTER — Ambulatory Visit (INDEPENDENT_AMBULATORY_CARE_PROVIDER_SITE_OTHER): Payer: Medicare PPO | Admitting: Cardiology

## 2013-08-14 VITALS — BP 115/60 | HR 68 | Ht <= 58 in | Wt 107.0 lb

## 2013-08-14 DIAGNOSIS — Z7901 Long term (current) use of anticoagulants: Secondary | ICD-10-CM

## 2013-08-14 DIAGNOSIS — E785 Hyperlipidemia, unspecified: Secondary | ICD-10-CM

## 2013-08-14 DIAGNOSIS — I4891 Unspecified atrial fibrillation: Secondary | ICD-10-CM

## 2013-08-14 LAB — BASIC METABOLIC PANEL
BUN: 21 mg/dL (ref 6–23)
CHLORIDE: 106 meq/L (ref 96–112)
CO2: 26 mEq/L (ref 19–32)
Calcium: 9 mg/dL (ref 8.4–10.5)
Creatinine, Ser: 0.9 mg/dL (ref 0.4–1.2)
GFR: 62.87 mL/min (ref >60.00)
Glucose, Bld: 104 mg/dL — ABNORMAL HIGH (ref 70–99)
POTASSIUM: 3.6 meq/L (ref 3.5–5.1)
Sodium: 139 mEq/L (ref 135–145)

## 2013-08-14 LAB — CBC WITH DIFFERENTIAL/PLATELET
BASOS PCT: 0.5 % (ref 0.0–3.0)
Basophils Absolute: 0 10*3/uL (ref 0.0–0.1)
EOS PCT: 1.6 % (ref 0.0–5.0)
Eosinophils Absolute: 0.1 10*3/uL (ref 0.0–0.7)
HEMATOCRIT: 38.4 % (ref 36.0–46.0)
Hemoglobin: 13 g/dL (ref 12.0–15.0)
LYMPHS ABS: 1.8 10*3/uL (ref 0.7–4.0)
Lymphocytes Relative: 25.2 % (ref 12.0–46.0)
MCHC: 33.9 g/dL (ref 30.0–36.0)
MCV: 97.3 fl (ref 78.0–100.0)
MONO ABS: 0.9 10*3/uL (ref 0.1–1.0)
Monocytes Relative: 12.5 % — ABNORMAL HIGH (ref 3.0–12.0)
Neutro Abs: 4.3 10*3/uL (ref 1.4–7.7)
Neutrophils Relative %: 60.2 % (ref 43.0–77.0)
Platelets: 201 10*3/uL (ref 150.0–400.0)
RBC: 3.95 Mil/uL (ref 3.87–5.11)
RDW: 13.9 % (ref 11.5–14.6)
WBC: 7.1 10*3/uL (ref 4.5–10.5)

## 2013-08-14 NOTE — Progress Notes (Signed)
[place]. [address], [address] [place], [place]  [address] Phone: [phone] Fax:  [phone]  Date:  08/14/2013   ID:  Barbara [name], DOB 03/21/1929, MRN [id number]  PCP:  Estill Dooms, MD   History of Present Illness: Barbara [name] is a 80 y.o. female with paroxysmal atrial fibrillation prior right rapid ventricular response and July 23 of 2009, nuclear stress test in 2008 low-risk, ejection fraction 70%, anemia, stroke 5/14 with new onset anticoagulation here for followup. In May she was here for followup visit then shortly thereafter, went to the hospital and was diagnosed with stroke.  AFIB - During prior clinic encounter, she has demonstrated atrial fibrillation with no symptoms. No palpiations, no symptoms. No syncope. No CP. No strokelike symptoms.   Cardizem has provided stability in regards to her atrial fibrillation. No chest pain. LDL was 107. TSH 2.1 doing very well without any complaints. No statins due to myalgias. Once a week Crestor did not work.  Recently was discovered to be significantly anemic during blood work on 12/05/12. Hemoglobin post transfusion was 11.9. Hemoglobin was down to 8.2 on 11/10/12. Previously was 13.7. Dr. [name] with gastroenterology graciously has seen her in consultation and recommended continuing to monitor her fecal blood cards while restarting anticoagulation. Her primary physician is currently Dr[facility] [name] [facility]. He is also managing anemia. Dr. [name] felt as though was stable for her to start her anticoagulation    Wt Readings from Last 3 Encounters:  08/14/13 107 lb (48.535 kg)  05/29/13 105 lb (47.628 kg)  03/08/13 101 lb (45.813 kg)     Past Medical History  Diagnosis Date  . Atrial fibrillation 2009    Long term anticoagulation. Warfarin changed to Xarelto 06/2012  . Cystocele, midline   . Senile dementia, uncomplicated   . Generalized anxiety disorder   . Unspecified glaucoma(365.9)   . Macular  degeneration (senile) of retina, unspecified   . Osteoarthrosis, unspecified whether generalized or localized, unspecified site   . Senile osteoporosis   . Pneumonia, organism unspecified 03/2012    Tx w/ PO antibx  . Unspecified vitamin D deficiency   . Unspecified venous (peripheral) insufficiency   . Tear film insufficiency, unspecified 08/2012  . Debility, unspecified 04/01/2012  . Long term (current) use of anticoagulants 04/01/2012  . Stroke   . CVA (cerebral infarction) 11/19/2012  . Anemia 11/2012    transfusion 11/2012  . Unspecified hypothyroidism   . Other and unspecified hyperlipidemia     Atorvastatin started 12/2012    Past Surgical History  Procedure Laterality Date  . Tonsillectomy and adenoidectomy    . Umbilical hernia repair    . Cataract extraction w/ intraocular lens  implant, bilateral    . Spine surgery      RE; Spinal stenosis    Current Outpatient Prescriptions  Medication Sig Dispense Refill  . atorvastatin (LIPITOR) 10 MG tablet Take 1 tablet (10 mg total) by mouth daily at 6 PM.      . bisacodyl (DULCOLAX) 10 MG suppository Place 10 mg rectally as needed for moderate constipation.      . Calcium Carbonate-Vitamin D (CALTRATE 600+D) 600-400 MG-UNIT per chew tablet Chew 1 tablet by mouth daily.      . Carboxymethylcellul-Glycerin (OPTIVE) 0.5-0.9 % SOLN Apply to eye. 1 drop every 4 hours daily      . carboxymethylcellulose (REFRESH PLUS) 0.5 % SOLN 1 drop 3 (three) times daily as needed.      Marland Kitchen  cholecalciferol (VITAMIN D) 1000 UNITS tablet Take 1,000 Units by mouth daily.      Marland Kitchen diltiazem (TIAZAC) 360 MG 24 hr capsule Take 360 mg by mouth daily.      . dorzolamide-timolol (COSOPT) 22.3-6.8 MG/ML ophthalmic solution Place 1 drop into both eyes 2 (two) times daily.      Marland Kitchen ELIQUIS 2.5 MG TABS tablet       . iron polysaccharides (NIFEREX) 150 MG capsule Take 150 mg by mouth daily.      Marland Kitchen levothyroxine (SYNTHROID, LEVOTHROID) 75 MCG tablet Take 75 mcg by mouth  daily before breakfast.      . Menthol-Methyl Salicylate (MUSCLE RUB) 10-15 % CREA Apply 1 application topically as needed (Apply at bedtime and as needed to reduce hip /back pain).       . Multiple Vitamins-Minerals (PRESERVISION/LUTEIN) CAPS Take 2 capsules by mouth daily.      . polyethylene glycol (MIRALAX / GLYCOLAX) packet Take 17 g by mouth daily. Mix with 6oz beverage of choice. Hold for loose stool      . sennosides-docusate sodium (SENOKOT-S) 8.6-50 MG tablet Take 1 tablet by mouth as needed for constipation.      . sertraline (ZOLOFT) 50 MG tablet Take 50 mg by mouth at bedtime.      . TRAVATAN Z 0.004 % SOLN ophthalmic solution        No current facility-administered medications for this visit.    Allergies:    Allergies  Allergen Reactions  . Aricept [Donepezil Hcl]     Muscle pain    Social History:  The patient  reports that she quit smoking about 32 years ago. Her smoking use included Cigarettes. She smoked 0.00 packs per day. She has never used smokeless tobacco. She reports that she does not drink alcohol or use illicit drugs. ROS:  Please see the history of present illness.   Denies any significant bleeding, orthopnea, PND    PHYSICAL EXAM: VS:  BP 115/60  Pulse 68  Ht 4' 8.5" (1.435 m)  Wt 107 lb (48.535 kg)  BMI 23.57 kg/m2  SpO2 98% Well nourished, well developed, in no acute distressElderly HEENT: normal Neck: no JVD Cardiac:  Irregularly irregular, normal rate no murmur Lungs:  clear to auscultation bilaterally, no wheezing, rhonchi or rales Abd: soft, nontender, no hepatomegaly Ext: no edema, minor bruising lower extremities Skin: warm and dry Neuro: no focal abnormalities noted  EKG:  None today, previous atrial fibrillation with rate control     ASSESSMENT AND PLAN:  1. Atrial fibrillation-permanent-anticoagulation present. Eliquis 2.5 mg twice a day. Age and weight adjusted. She is overall doing very well, no significant bleeding. We will check  blood work, basic metabolic profile and CBC every 6 months. Rate controlled with diltiazem. 2. Hyperlipidemia-continue with low-dose atorvastatin. 3. Chronic anticoagulation-no significant changes.   Signed, Candee Furbish, MD [facility]  08/14/2013 2:46 PM

## 2013-08-14 NOTE — Patient Instructions (Signed)
Your physician recommends that you have lab work today: BMET, CBC   Your physician wants you to follow-up in: 6 months with Dr. Marlou Porch we will check BMET and CBC on that day as well You will receive a reminder letter in the mail two months in advance. If you don't receive a letter, please call our office to schedule the follow-up appointment.  Your physician recommends that you continue on your current medications as directed. Please refer to the Current Medication list given to you today.

## 2013-08-16 ENCOUNTER — Telehealth: Payer: Self-pay | Admitting: *Deleted

## 2013-08-16 NOTE — Telephone Encounter (Signed)
lmtcb for results. Number provided

## 2013-08-29 ENCOUNTER — Non-Acute Institutional Stay (SKILLED_NURSING_FACILITY): Payer: Medicare PPO | Admitting: Geriatric Medicine

## 2013-08-29 ENCOUNTER — Encounter: Payer: Self-pay | Admitting: Geriatric Medicine

## 2013-08-29 DIAGNOSIS — M79652 Pain in left thigh: Secondary | ICD-10-CM

## 2013-08-29 DIAGNOSIS — M79609 Pain in unspecified limb: Secondary | ICD-10-CM

## 2013-08-29 NOTE — Progress Notes (Signed)
Patient ID: Barbara [name], female   DOB: 07-28-1929, 80 y.o.   MRN: [id number]  [facility] [phone])  Code Status: Full Code  Contact Information   Name Relation Home Work Mobile   [place] Daughter [phone]         Chief Complaint  Patient presents with  . Hip Pain  . Decline in functional status    HPI: This is a 80 y.o. female resident of [place], Assisted Living section transferred to the rehab section yesterday due to declining functional status since a fall 08/21/13. Patient had an unwitnessed fall in her apartment in the early hours of January 15. She complained of left hip pain but was able to ambulate with little assistance initially. Later in the day she was a unable to bear weight, x-ray of the lumbar spine, pelvis, left hip and femur were negative for acute fractures. Ibuprofen was added for pain management. She was feeling better by 1/17; ambulating without assistance with her walker. Yesterday, patient was again unable to bear weight fully on her left leg, she required assistance with transfers and ambulation. She had an incontinent episode with urine because she couldn't get to the toilet in time.   Allergies  Allergen Reactions  . Aricept [Donepezil Hcl]     Muscle pain     Medication List       This list is accurate as of: 08/29/13 11:55 AM.  Always use your most recent med list.               acetaminophen 325 MG tablet  Commonly known as:  TYLENOL  Take 650 mg by mouth 2 (two) times daily.     atorvastatin 10 MG tablet  Commonly known as:  LIPITOR  Take 1 tablet (10 mg total) by mouth daily at 6 PM.     bisacodyl 10 MG suppository  Commonly known as:  DULCOLAX  Place 10 mg rectally as needed for moderate constipation.     CALTRATE 600+D 600-400 MG-UNIT per chew tablet  Generic drug:  Calcium Carbonate-Vitamin D  Chew 1 tablet by mouth daily.     carboxymethylcellulose 0.5 % Soln    Commonly known as:  REFRESH PLUS  1 drop 3 (three) times daily as needed.     cholecalciferol 1000 UNITS tablet  Commonly known as:  VITAMIN D  Take 1,000 Units by mouth daily.     diltiazem 360 MG 24 hr capsule  Commonly known as:  TIAZAC  Take 360 mg by mouth daily.     dorzolamide-timolol 22.3-6.8 MG/ML ophthalmic solution  Commonly known as:  COSOPT  Place 1 drop into both eyes 2 (two) times daily.     ELIQUIS 2.5 MG Tabs tablet  Generic drug:  apixaban  Take 2.5 mg by mouth 2 (two) times daily.     ibuprofen 600 MG tablet  Commonly known as:  ADVIL,MOTRIN  Take 600 mg by mouth 2 (two) times daily.     levothyroxine 75 MCG tablet  Commonly known as:  SYNTHROID, LEVOTHROID  Take 75 mcg by mouth daily before breakfast.     MUSCLE RUB 10-15 % Crea  Apply 1 application topically as needed (Apply at bedtime and as needed to reduce hip /back pain).     OPTIVE 0.5-0.9 % Soln  Generic drug:  Carboxymethylcellul-Glycerin  Apply to eye. 1 drop every 4 hours daily     polyethylene glycol packet  Commonly known as:  MIRALAX / GLYCOLAX  Take 17 g by mouth daily. Mix with 6oz beverage of choice. Hold for loose stool     PRESERVISION/LUTEIN Caps  Take 2 capsules by mouth daily.     sennosides-docusate sodium 8.6-50 MG tablet  Commonly known as:  SENOKOT-S  Take 1 tablet by mouth as needed for constipation.     sertraline 50 MG tablet  Commonly known as:  ZOLOFT  Take 50 mg by mouth at bedtime.     TRAVATAN Z 0.004 % Soln ophthalmic solution  Generic drug:  Travoprost (BAK Free)        DATA REVIEWED  Radiologic Exams  [facility] X-ray  07/17/2013 X-ray Rt.humerus: Mild osteopenia, no acute fracture. Degenerative irregularity at rt. Humeral head   Rt.Shoulder: Mod. osteopenia and OA  @ rt. Ac joint. No dislocation or fracture. Superior subluxation of rt proximal humerus c/w chronic rotator cuff injury. Old healed fracture deformity lateral clavicle   Bilateral  ribs: Mod-severe osteopenia. No acute fracture. Limited evaluation of lower roibs  08/24/2013 X-ray Pelvis: Mld-mod. Osteopenia. No acute fracture. Minimal OA @ Si joints   Left hip: Mild osteopenoia. Mod. OA. NO acute fracture   Left femur: Mod. Osteopenia. No acute fracture. Mod. OA of knee   Lumbar Spine: Mild levoconvex curvature mid LS. Mod-sever osteopenia. Mild OA/degenerative spondylosis. No dislocation, vertebral compression deformity. Anterior subluxation L4 relative to l5 (47mm)  Cardiovascular Exams:   Laboratory Studies:     Lab Results  Component Value Date   TSH 0.033* 12/20/2012   Lab Results  Component Value Date   TSH 6.26* 04/06/2013     Lab Results  Component Value Date   WBC 7.1 08/14/2013   HGB 13.0 08/14/2013   HCT 38.4 08/14/2013   PLT 201.0 08/14/2013        GLUCOSE 104* 08/14/2013   NA 139 08/14/2013   K 3.6 08/14/2013   CL 106 08/14/2013   CREATININE 0.9 08/14/2013   BUN 21 08/14/2013   CO2 26 08/14/2013    REVIEW OF SYSTEMS DATA OBTAINED: from patient, nurse, medical record, GENERAL: Feels "OK... Not angry like yesterday"   No fevers, fatigue, change in appetite or weight SKIN: No itch, rash or open wounds EYES: No eye pain, dryness or itching  No change in vision (impaired vision, chronic) EARS: No earache, tinnitus, change in hearing NOSE: No congestion, drainage or bleeding MOUTH/THROAT: No mouth or tooth pain   No sore throat     No difficulty chewing or swallowing RESPIRATORY: No cough, wheezing, SOB CARDIAC: No chest pain, palpitations  No edema. GI: No abdominal pain    No N/V/D or constipation    No heartburn or reflux  GU: No dysuria, frequency or urgency    No change in urine volume or character  Recent incontinence due to difficulty ambulating MUSCULOSKELETAL: Left hip/ thigh pain with weight bearing. Staff notes patient uses walker incorrectly NEUROLOGIC: No dizziness, fainting, headache, No change in mental status (dementia)  PSYCHIATRIC: No feelings  of anxiety, depression Sleeps well.  No behavior issue.   PHYSICAL EXAM  Filed Vitals:   08/29/13 1157  BP: 114/79  Pulse: 84  Temp: 97.2 F (36.2 C)  Resp: 18  Weight: 106 lb 12.8 oz (48.444 kg)   Body mass index is 23.53 kg/(m^2).  GENERAL APPEARANCE: No acute distress, appropriately groomed, normal body habitus. Alert, pleasant, conversant. SKIN: No diaphoresis, rash, unusual lesions, wounds HEAD: Normocephalic, atraumatic EYES: Conjunctiva/lids clear. Pupils round, reactive.  EARS: External exam WNL,  Hearing  grossly normal. NOSE: No deformity or discharge. MOUTH/THROAT: Lips w/o lesions. Oral mucosa, tongue moist, w/o lesion. Oropharynx w/o redness or lesions.  NECK: Supple, full ROM. No thyroid tenderness, enlargement or nodule LYMPHATICS: No head, neck or supraclavicular adenopathy RESPIRATORY: Breathing is even, unlabored. Lung sounds are clear and full.  CARDIOVASCULAR: Heart IRRR. No murmur or extra heart sounds  EDEMA: No peripheral or periorbital edema. No ascites GASTROINTESTINAL: Abdomen is soft, non-tender, not distended w/ normal bowel sounds. MUSCULOSKELETAL: Moves UE and right LE extremities with full ROM, strength and tone. Unable to lift straight left leg up, able to flex knee and hip w/o discomfort. Tenderness lateral proximal thigh. Able to stand from chair to walker unaided.  NEUROLOGIC: Not Oriented to time, place. Oriented to person. Speech clear, no tremor. PSYCHIATRIC: Mood and affect appropriate to situation  ASSESSMENT/PLAN  Left thigh pain Left lateral thigh pain is impairing patient's mobility. X-ray result negative for fracture. Add PT/OT for assistance with mobility and ADLs. If no improvement will require further imaging.   Follow up: As  needed  Nakayla Rorabaugh T.Elesha Thedford, NP-C 08/29/2013

## 2013-09-01 DIAGNOSIS — M79652 Pain in left thigh: Secondary | ICD-10-CM | POA: Insufficient documentation

## 2013-09-01 NOTE — Assessment & Plan Note (Signed)
Left lateral thigh pain is impairing patient's mobility. X-ray result negative for fracture. Add PT/OT for assistance with mobility and ADLs. If no improvement will require further imaging.

## 2013-09-04 ENCOUNTER — Other Ambulatory Visit: Payer: Self-pay | Admitting: Internal Medicine

## 2013-09-04 ENCOUNTER — Encounter: Payer: Self-pay | Admitting: Geriatric Medicine

## 2013-09-04 ENCOUNTER — Non-Acute Institutional Stay (SKILLED_NURSING_FACILITY): Payer: Medicare PPO | Admitting: Geriatric Medicine

## 2013-09-04 DIAGNOSIS — M25552 Pain in left hip: Secondary | ICD-10-CM

## 2013-09-04 DIAGNOSIS — M25559 Pain in unspecified hip: Secondary | ICD-10-CM

## 2013-09-04 DIAGNOSIS — R102 Pelvic and perineal pain: Secondary | ICD-10-CM

## 2013-09-04 NOTE — Progress Notes (Signed)
Patient ID: Barbara [name], female   DOB: [birth year], 80 y.o.   MRN: [id number]  [facility] [id number])  Code Status: Full Code      Contact Information   Name Relation Home Work Mobile   [place] Daughter [phone]         Chief Complaint  Patient presents with  . Left hip pain    HPI: This is a 80 y.o. female resident of [place], Assisted Living section transferred to the rehab section yesterday due to declining functional status since a fall 08/21/13. Patient had an unwitnessed fall in her apartment in the early hours of January 15. She complained of left hip pain but was able to ambulate with little assistance initially. Later in the day she was a unable to bear weight, x-ray of the lumbar spine, pelvis, left hip and femur were negative for acute fractures. Ibuprofen was added for pain management. She was feeling better by 1/17; ambulating without assistance with her walker. Yesterday, patient was again unable to bear weight fully on her left leg, she required assistance with transfers and ambulation. She had an incontinent episode with urine because she couldn't get to the toilet in time.  Last visit:  Left thigh pain Left lateral thigh pain is impairing patient's mobility. X-ray result negative for fracture. Add PT/OT for assistance with mobility and ADLs. If no improvement will require further imaging.  Since last visit, patient's mobility continues to be impaired by complaints of left hip pain. She is participating with physical therapy, ambulating short distances with a walker. She ambulates very gingerly, complaining of pain in her posterior hip consistently.    Allergies  Allergen Reactions  . Aricept [Donepezil Hcl]     Muscle pain     Medication List       This list is accurate as of: 09/04/13  1:03 PM.  Always use your most recent med list.               acetaminophen 325 MG tablet  Commonly known as:   TYLENOL  Take 650 mg by mouth 2 (two) times daily.     atorvastatin 10 MG tablet  Commonly known as:  LIPITOR  Take 1 tablet (10 mg total) by mouth daily at 6 PM.     BENGAY COLD THERAPY 5 % Gel  Generic drug:  Menthol (Topical Analgesic)  Apply 1 application topically 4 (four) times daily. Apply to lower back and/or bilateral hips with pain up to 4 times daily.     bisacodyl 10 MG suppository  Commonly known as:  DULCOLAX  Place 10 mg rectally as needed for moderate constipation.     CALTRATE 600+D 600-400 MG-UNIT per chew tablet  Generic drug:  Calcium Carbonate-Vitamin D  Chew 1 tablet by mouth daily.     carboxymethylcellulose 0.5 % Soln  Commonly known as:  REFRESH PLUS  1 drop 3 (three) times daily as needed.     cholecalciferol 1000 UNITS tablet  Commonly known as:  VITAMIN D  Take 1,000 Units by mouth daily.     diltiazem 360 MG 24 hr capsule  Commonly known as:  TIAZAC  Take 360 mg by mouth daily.     dorzolamide-timolol 22.3-6.8 MG/ML ophthalmic solution  Commonly known as:  COSOPT  Place 1 drop into both eyes 2 (two) times daily.     ELIQUIS 2.5 MG Tabs tablet  Generic drug:  apixaban  Take 2.5 mg by mouth 2 (  two) times daily.     ibuprofen 600 MG tablet  Commonly known as:  ADVIL,MOTRIN  Take 600 mg by mouth 2 (two) times daily.     levothyroxine 75 MCG tablet  Commonly known as:  SYNTHROID, LEVOTHROID  Take 75 mcg by mouth daily before breakfast.     MUSCLE RUB 10-15 % Crea  Apply 1 application topically as needed (Apply at bedtime and as needed to reduce hip /back pain).     OPTIVE 0.5-0.9 % Soln  Generic drug:  Carboxymethylcellul-Glycerin  Apply to eye. 1 drop every 4 hours daily     polyethylene glycol packet  Commonly known as:  MIRALAX / GLYCOLAX  Take 17 g by mouth daily. Mix with 6oz beverage of choice. Hold for loose stool     PRESERVISION/LUTEIN Caps  Take 2 capsules by mouth daily.     sennosides-docusate sodium 8.6-50 MG tablet    Commonly known as:  SENOKOT-S  Take 1 tablet by mouth as needed for constipation.     sertraline 50 MG tablet  Commonly known as:  ZOLOFT  Take 50 mg by mouth at bedtime.     TRAVATAN Z 0.004 % Soln ophthalmic solution  Generic drug:  Travoprost (BAK Free)        DATA REVIEWED  Radiologic Exams  [facility] X-ray  07/17/2013 X-ray Rt.humerus: Mild osteopenia, no acute fracture. Degenerative irregularity at rt. Humeral head   Rt.Shoulder: Mod. osteopenia and OA  @ rt. Ac joint. No dislocation or fracture. Superior subluxation of rt proximal humerus c/w chronic rotator cuff injury. Old healed fracture deformity lateral clavicle   Bilateral ribs: Mod-severe osteopenia. No acute fracture. Limited evaluation of lower roibs  08/24/2013 X-ray Pelvis: Mld-mod. Osteopenia. No acute fracture. Minimal OA @ Si joints   Left hip: Mild osteopenoia. Mod. OA. NO acute fracture   Left femur: Mod. Osteopenia. No acute fracture. Mod. OA of knee   Lumbar Spine: Mild levoconvex curvature mid LS. Mod-sever osteopenia. Mild OA/degenerative spondylosis. No dislocation, vertebral compression deformity. Anterior subluxation L4 relative to l5 (76mm)  Cardiovascular Exams:   Laboratory Studies:     Lab Results  Component Value Date   TSH 6.26* 04/06/2013   Lab Results  Component Value Date   TSH 6.26* 04/06/2013     Lab Results  Component Value Date   WBC 7.1 08/14/2013   HGB 13.0 08/14/2013   HCT 38.4 08/14/2013   PLT 201.0 08/14/2013        GLUCOSE 104* 08/14/2013   NA 139 08/14/2013   K 3.6 08/14/2013   CL 106 08/14/2013   CREATININE 0.9 08/14/2013   BUN 21 08/14/2013   CO2 26 08/14/2013    REVIEW OF SYSTEMS DATA OBTAINED: from patient, nurse, medical record, GENERAL: Feels "OK"  No fevers, fatigue, change in appetite or weight SKIN: No itch, rash or open wounds EYES: No eye pain, dryness or itching  No change in vision (impaired vision, chronic) EARS: No earache, tinnitus, change in hearing NOSE: No  congestion, drainage or bleeding MOUTH/THROAT: No mouth or tooth pain   No sore throat     No difficulty chewing or swallowing RESPIRATORY: No cough, wheezing, SOB CARDIAC: No chest pain, palpitations  No edema. GI: No abdominal pain    No N/V/D or constipation    No heartburn or reflux  GU: No dysuria, frequency or urgency    No change in urine volume or character  Recent incontinence due to difficulty ambulating MUSCULOSKELETAL: Left hip pain  NEUROLOGIC: No dizziness, fainting, headache, No change in mental status (dementia)  PSYCHIATRIC: No feelings of anxiety, depression Sleeps well.  No behavior issue.   PHYSICAL EXAM  Filed Vitals:   09/04/13 1302  BP: 135/78  Pulse: 76  Temp: 97 F (36.1 C)  Resp: 18   There is no weight on file to calculate BMI.  GENERAL APPEARANCE: No acute distress, appropriately groomed, normal body habitus. Alert, pleasant, conversant. SKIN: No diaphoresis, rash, unusual lesions, wounds HEAD: Normocephalic, atraumatic EYES: Conjunctiva/lids clear. Pupils round, reactive.  EARS: External exam WNL,  Hearing grossly normal. NOSE: No deformity or discharge. MOUTH/THROAT: Lips w/o lesions. Oral mucosa, tongue moist, w/o lesion. Oropharynx w/o redness or lesions.  NECK: Supple, full ROM. No thyroid tenderness, enlargement or nodule LYMPHATICS: No head, neck or supraclavicular adenopathy RESPIRATORY: Breathing is even, unlabored. Lung sounds are clear and full.  CARDIOVASCULAR: Heart IRRR. No murmur or extra heart sounds  EDEMA: No peripheral or periorbital edema. No ascites GASTROINTESTINAL: Abdomen is soft, non-tender, not distended w/ normal bowel sounds. MUSCULOSKELETAL: Moves UE and right LE extremities with full ROM, strength and tone. Posterior Left hip pain with weight bearing. Resolving ecchymosis lateral left hip. Ambulates slowly with walker, it appears she supporting herself with the use of her arms to avoid full weightbearing on her left leg.  Unable to lift straight left leg up, able to flex knee and hip w/o discomfort. Tenderness lateral proximal thigh. Able to stand from chair to walker unaided.  NEUROLOGIC: Not Oriented to time, place. Oriented to person. Speech clear, no tremor. PSYCHIATRIC: Mood and affect appropriate to situation  ASSESSMENT/PLAN  Left hip pain Posterior left hip pain most prominent with weightbearing. Plain x-rays 10 days ago did not reveal any fractures in the lumbar spine pelvis hip or femur appear. Would expect this discomfort to be improving by this time. Recommend MRI to rule out occult fracture. Discussed with patient and patient's daughter, they both agree.   Follow up: As  needed  Talmadge Ganas T.Tibor Lemmons, NP-C 09/04/2013

## 2013-09-04 NOTE — Assessment & Plan Note (Signed)
Posterior left hip pain most prominent with weightbearing. Plain x-rays 10 days ago did not reveal any fractures in the lumbar spine pelvis hip or femur appear. Would expect this discomfort to be improving by this time. Recommend MRI to rule out occult fracture. Discussed with patient and patient's daughter, they both agree.

## 2013-09-06 ENCOUNTER — Encounter: Payer: Medicare PPO | Admitting: Geriatric Medicine

## 2013-09-06 ENCOUNTER — Non-Acute Institutional Stay: Payer: Medicare PPO | Admitting: Geriatric Medicine

## 2013-09-06 DIAGNOSIS — M810 Age-related osteoporosis without current pathological fracture: Secondary | ICD-10-CM

## 2013-09-06 MED ORDER — DENOSUMAB 60 MG/ML ~~LOC~~ SOLN: 60.0000 mg | Freq: Once | SUBCUTANEOUS | Status: DC

## 2013-09-06 MED ORDER — DENOSUMAB 60 MG/ML ~~LOC~~ SOLN
60.0000 mg | Freq: Once | SUBCUTANEOUS | Status: AC
  Administered 2013-09-06: 60 mg via SUBCUTANEOUS

## 2013-09-06 NOTE — Progress Notes (Signed)
Patient ID: Barbara [name], female   DOB: September 22, 1928, 80 y.o.   MRN: [id number]  Prolia injection by Avel Sensor, CMA

## 2013-09-09 ENCOUNTER — Ambulatory Visit (HOSPITAL_BASED_OUTPATIENT_CLINIC_OR_DEPARTMENT_OTHER)
Admission: RE | Admit: 2013-09-09 | Discharge: 2013-09-09 | Disposition: A | Discharge status: Home / Self Care | Payer: Medicare PPO | Source: Ambulatory Visit | Attending: Internal Medicine | Admitting: Internal Medicine

## 2013-09-09 ENCOUNTER — Other Ambulatory Visit (HOSPITAL_BASED_OUTPATIENT_CLINIC_OR_DEPARTMENT_OTHER): Payer: Medicare PPO

## 2013-09-09 DIAGNOSIS — M5137 Other intervertebral disc degeneration, lumbosacral region: Secondary | ICD-10-CM | POA: Insufficient documentation

## 2013-09-09 DIAGNOSIS — W19XXXA Unspecified fall, initial encounter: Secondary | ICD-10-CM | POA: Insufficient documentation

## 2013-09-09 DIAGNOSIS — M25559 Pain in unspecified hip: Secondary | ICD-10-CM | POA: Insufficient documentation

## 2013-09-09 DIAGNOSIS — S32309A Unspecified fracture of unspecified ilium, initial encounter for closed fracture: Secondary | ICD-10-CM | POA: Insufficient documentation

## 2013-09-09 DIAGNOSIS — S32509A Unspecified fracture of unspecified pubis, initial encounter for closed fracture: Secondary | ICD-10-CM | POA: Insufficient documentation

## 2013-09-09 DIAGNOSIS — M25552 Pain in left hip: Secondary | ICD-10-CM

## 2013-09-09 DIAGNOSIS — M51379 Other intervertebral disc degeneration, lumbosacral region without mention of lumbar back pain or lower extremity pain: Secondary | ICD-10-CM | POA: Insufficient documentation

## 2013-09-09 DIAGNOSIS — K5730 Diverticulosis of large intestine without perforation or abscess without bleeding: Secondary | ICD-10-CM | POA: Insufficient documentation

## 2013-09-09 DIAGNOSIS — R102 Pelvic and perineal pain: Secondary | ICD-10-CM

## 2013-09-12 ENCOUNTER — Other Ambulatory Visit: Payer: Medicare PPO

## 2013-10-13 ENCOUNTER — Encounter: Payer: Self-pay | Admitting: Cardiology

## 2013-11-03 ENCOUNTER — Emergency Department (HOSPITAL_COMMUNITY)
Admission: EM | Admit: 2013-11-03 | Discharge: 2013-11-03 | Disposition: A | Discharge status: Home / Self Care | Payer: Medicare PPO | Attending: Emergency Medicine | Admitting: Emergency Medicine

## 2013-11-03 ENCOUNTER — Encounter (HOSPITAL_COMMUNITY): Payer: Self-pay | Admitting: Emergency Medicine

## 2013-11-03 ENCOUNTER — Emergency Department (HOSPITAL_COMMUNITY): Payer: Medicare PPO

## 2013-11-03 DIAGNOSIS — S0093XA Contusion of unspecified part of head, initial encounter: Secondary | ICD-10-CM | POA: Insufficient documentation

## 2013-11-03 DIAGNOSIS — S0990XA Unspecified injury of head, initial encounter: Secondary | ICD-10-CM | POA: Insufficient documentation

## 2013-11-03 DIAGNOSIS — Y9389 Activity, other specified: Secondary | ICD-10-CM | POA: Insufficient documentation

## 2013-11-03 DIAGNOSIS — E785 Hyperlipidemia, unspecified: Secondary | ICD-10-CM | POA: Insufficient documentation

## 2013-11-03 DIAGNOSIS — Z8669 Personal history of other diseases of the nervous system and sense organs: Secondary | ICD-10-CM | POA: Insufficient documentation

## 2013-11-03 DIAGNOSIS — F0390 Unspecified dementia without behavioral disturbance: Secondary | ICD-10-CM | POA: Insufficient documentation

## 2013-11-03 DIAGNOSIS — S81801A Unspecified open wound, right lower leg, initial encounter: Secondary | ICD-10-CM

## 2013-11-03 DIAGNOSIS — Z8701 Personal history of pneumonia (recurrent): Secondary | ICD-10-CM | POA: Insufficient documentation

## 2013-11-03 DIAGNOSIS — S91009A Unspecified open wound, unspecified ankle, initial encounter: Secondary | ICD-10-CM

## 2013-11-03 DIAGNOSIS — Z8742 Personal history of other diseases of the female genital tract: Secondary | ICD-10-CM | POA: Insufficient documentation

## 2013-11-03 DIAGNOSIS — Z8739 Personal history of other diseases of the musculoskeletal system and connective tissue: Secondary | ICD-10-CM | POA: Insufficient documentation

## 2013-11-03 DIAGNOSIS — W1809XA Striking against other object with subsequent fall, initial encounter: Secondary | ICD-10-CM | POA: Insufficient documentation

## 2013-11-03 DIAGNOSIS — Z79899 Other long term (current) drug therapy: Secondary | ICD-10-CM | POA: Insufficient documentation

## 2013-11-03 DIAGNOSIS — F411 Generalized anxiety disorder: Secondary | ICD-10-CM | POA: Insufficient documentation

## 2013-11-03 DIAGNOSIS — Z8673 Personal history of transient ischemic attack (TIA), and cerebral infarction without residual deficits: Secondary | ICD-10-CM | POA: Insufficient documentation

## 2013-11-03 DIAGNOSIS — I4891 Unspecified atrial fibrillation: Secondary | ICD-10-CM | POA: Insufficient documentation

## 2013-11-03 DIAGNOSIS — S0003XA Contusion of scalp, initial encounter: Secondary | ICD-10-CM | POA: Insufficient documentation

## 2013-11-03 DIAGNOSIS — S0083XA Contusion of other part of head, initial encounter: Secondary | ICD-10-CM

## 2013-11-03 DIAGNOSIS — Z862 Personal history of diseases of the blood and blood-forming organs and certain disorders involving the immune mechanism: Secondary | ICD-10-CM | POA: Insufficient documentation

## 2013-11-03 DIAGNOSIS — S81009A Unspecified open wound, unspecified knee, initial encounter: Secondary | ICD-10-CM | POA: Insufficient documentation

## 2013-11-03 DIAGNOSIS — Y9289 Other specified places as the place of occurrence of the external cause: Secondary | ICD-10-CM | POA: Insufficient documentation

## 2013-11-03 DIAGNOSIS — E039 Hypothyroidism, unspecified: Secondary | ICD-10-CM | POA: Insufficient documentation

## 2013-11-03 DIAGNOSIS — Z87891 Personal history of nicotine dependence: Secondary | ICD-10-CM | POA: Insufficient documentation

## 2013-11-03 DIAGNOSIS — S81809A Unspecified open wound, unspecified lower leg, initial encounter: Secondary | ICD-10-CM

## 2013-11-03 DIAGNOSIS — Z791 Long term (current) use of non-steroidal anti-inflammatories (NSAID): Secondary | ICD-10-CM | POA: Insufficient documentation

## 2013-11-03 DIAGNOSIS — S1093XA Contusion of unspecified part of neck, initial encounter: Secondary | ICD-10-CM

## 2013-11-03 LAB — URINALYSIS, ROUTINE W REFLEX MICROSCOPIC
BILIRUBIN URINE: NEGATIVE
Glucose, UA: NEGATIVE mg/dL
Hgb urine dipstick: NEGATIVE
Ketones, ur: NEGATIVE mg/dL
Nitrite: NEGATIVE
Protein, ur: NEGATIVE mg/dL
Specific Gravity, Urine: 1.014 (ref 1.005–1.030)
UROBILINOGEN UA: 0.2 mg/dL (ref 0.0–1.0)
pH: 6 (ref 5.0–8.0)

## 2013-11-03 LAB — CBC WITH DIFFERENTIAL/PLATELET
BASOS ABS: 0 10*3/uL (ref 0.0–0.1)
BASOS PCT: 0 % (ref 0–1)
EOS ABS: 0.2 10*3/uL (ref 0.0–0.7)
Eosinophils Relative: 2 % (ref 0–5)
HCT: 36.4 % (ref 36.0–46.0)
Hemoglobin: 12.4 g/dL (ref 12.0–15.0)
Lymphocytes Relative: 20 % (ref 12–46)
Lymphs Abs: 1.7 10*3/uL (ref 0.7–4.0)
MCH: 32.9 pg (ref 26.0–34.0)
MCHC: 34.1 g/dL (ref 30.0–36.0)
MCV: 96.6 fL (ref 78.0–100.0)
Monocytes Absolute: 1.1 10*3/uL — ABNORMAL HIGH (ref 0.1–1.0)
Monocytes Relative: 13 % — ABNORMAL HIGH (ref 3–12)
NEUTROS ABS: 5.5 10*3/uL (ref 1.7–7.7)
NEUTROS PCT: 65 % (ref 43–77)
PLATELETS: 280 10*3/uL (ref 150–400)
RBC: 3.77 MIL/uL — ABNORMAL LOW (ref 3.87–5.11)
RDW: 13.2 % (ref 11.5–15.5)
WBC: 8.5 10*3/uL (ref 4.0–10.5)

## 2013-11-03 LAB — BASIC METABOLIC PANEL
BUN: 17 mg/dL (ref 6–23)
CO2: 21 mEq/L (ref 19–32)
Calcium: 9.1 mg/dL (ref 8.4–10.5)
Chloride: 103 mEq/L (ref 96–112)
Creatinine, Ser: 0.7 mg/dL (ref 0.50–1.10)
GFR calc Af Amer: >90 mL/min (ref >90)
GFR calc non Af Amer: 79 mL/min — ABNORMAL LOW (ref >90)
Glucose, Bld: 87 mg/dL (ref 70–99)
Potassium: 4.4 mEq/L (ref 3.7–5.3)
Sodium: 138 mEq/L (ref 137–147)

## 2013-11-03 LAB — URINE MICROSCOPIC-ADD ON

## 2013-11-03 MED ORDER — SODIUM CHLORIDE 0.9 % IV SOLN
INTRAVENOUS | Status: DC
  Administered 2013-11-03: 15:00:00 via INTRAVENOUS

## 2013-11-03 NOTE — ED Notes (Signed)
MD at bedside. 

## 2013-11-03 NOTE — ED Provider Notes (Signed)
CSN: [id number]     Arrival date & time 11/03/13  1350 History   First MD Initiated Contact with Patient 11/03/13 1400     Chief Complaint  Patient presents with  . Dizziness  . Fall     (Consider location/radiation/quality/duration/timing/severity/associated sxs/prior Treatment) Patient is a 80 y.o. female presenting with dizziness and fall. The history is provided by the patient and a relative.  Dizziness Fall   She was sitting on a commode, stood up, and fell, striking her face. Incident occurred this morning. She another near-fall this morning, that was aborted when the nurse, "caught her". She has had ongoing dizziness for several months. During this time, she has had several falls. In January, she had pelvic rami fractures after a fall. She's been eating well. She describes a dizzy feeling when standing. That is like spinning. There's been no loss of consciousness. No persistent, weakness. No cough, fever, or chills, or change in bowel and urinary habits. There are no other known modifying factors.  Past Medical History  Diagnosis Date  . Atrial fibrillation 2009    Long term anticoagulation. Warfarin changed to Xarelto 06/2012  . Cystocele, midline   . Senile dementia, uncomplicated   . Generalized anxiety disorder   . Unspecified glaucoma   . Macular degeneration (senile) of retina, unspecified   . Osteoarthrosis, unspecified whether generalized or localized, unspecified site   . Senile osteoporosis   . Pneumonia, organism unspecified 03/2012    Tx w/ PO antibx  . Unspecified vitamin D deficiency   . Unspecified venous (peripheral) insufficiency   . Tear film insufficiency, unspecified 08/2012  . Debility, unspecified 04/01/2012  . Long term (current) use of anticoagulants 04/01/2012  . Stroke   . CVA (cerebral infarction) 11/19/2012  . Anemia 11/2012    transfusion 11/2012  . Unspecified hypothyroidism   . Other and unspecified hyperlipidemia     Atorvastatin started  12/2012   Past Surgical History  Procedure Laterality Date  . Tonsillectomy and adenoidectomy    . Umbilical hernia repair    . Cataract extraction w/ intraocular lens  implant, bilateral    . Spine surgery      RE; Spinal stenosis   Family History  Problem Relation Age of Onset  . Heart attack Father    History  Substance Use Topics  . Smoking status: Former Smoker    Types: Cigarettes    Quit date: 03/08/1981  . Smokeless tobacco: Never Used  . Alcohol Use: No   OB History   Grav Para Term Preterm Abortions TAB SAB Ect Mult Living                 Review of Systems  Neurological: Positive for dizziness.  All other systems reviewed and are negative.      Allergies  Aricept  Home Medications   Current Outpatient Rx  Name  Route  Sig  Dispense  Refill  . acetaminophen (TYLENOL) 325 MG tablet   Oral   Take 650 mg by mouth 2 (two) times daily.         Marland Kitchen atorvastatin (LIPITOR) 10 MG tablet   Oral   Take 10 mg by mouth daily.         . Calcium Carbonate-Vitamin D (CALTRATE 600+D) 600-400 MG-UNIT per chew tablet   Oral   Chew 1 tablet by mouth daily.         . carboxymethylcellulose (REFRESH PLUS) 0.5 % SOLN   Both Eyes   Place 1  drop into both eyes every 4 (four) hours as needed.          . cholecalciferol (VITAMIN D) 1000 UNITS tablet   Oral   Take 1,000 Units by mouth daily.         Marland Kitchen diltiazem (TIAZAC) 360 MG 24 hr capsule   Oral   Take 360 mg by mouth daily.         . dorzolamide-timolol (COSOPT) 22.3-6.8 MG/ML ophthalmic solution   Both Eyes   Place 1 drop into both eyes 2 (two) times daily.         Marland Kitchen ELIQUIS 2.5 MG TABS tablet   Oral   Take 2.5 mg by mouth 2 (two) times daily.          Marland Kitchen ibuprofen (ADVIL,MOTRIN) 600 MG tablet   Oral   Take 600 mg by mouth 2 (two) times daily.         Marland Kitchen levothyroxine (SYNTHROID, LEVOTHROID) 75 MCG tablet   Oral   Take 75 mcg by mouth daily before breakfast.         . Menthol, Topical  Analgesic, (BENGAY COLD THERAPY) 5 % GEL   Apply externally   Apply 1 application topically 4 (four) times daily. Apply to lower back and/or bilateral hips with pain up to 4 times daily.         . Multiple Vitamins-Minerals (PRESERVISION/LUTEIN) CAPS   Oral   Take 2 capsules by mouth daily.         . polyethylene glycol (MIRALAX / GLYCOLAX) packet   Oral   Take 17 g by mouth daily. Mix with 6oz beverage of choice. Hold for loose stool         . sennosides-docusate sodium (SENOKOT-S) 8.6-50 MG tablet   Oral   Take 1 tablet by mouth at bedtime as needed for constipation.          . sertraline (ZOLOFT) 50 MG tablet   Oral   Take 50 mg by mouth at bedtime.         . SODIUM & POTASSIUM BICARBONATE PO   Oral   Take 8-16 oz by mouth every hour.         . Teriparatide, Recombinant, (FORTEO) 600 MCG/2.4ML SOLN   Subcutaneous   Inject 20 mcg into the skin every morning.         . TRAVATAN Z 0.004 % SOLN ophthalmic solution   Both Eyes   Place 1 drop into both eyes daily.           BP 150/87  Pulse 99  Temp(Src) 98.2 F (36.8 C)  Resp 23  Ht 5\' 1"  (1.549 m)  Wt 100 lb (45.36 kg)  BMI 18.90 kg/m2  SpO2 97% Physical Exam  Nursing note and vitals reviewed. Constitutional: She is oriented to person, place, and time. She appears well-developed.  Elderly, frail  HENT:  Head: Normocephalic.  Large contusion, right forehead, and right periorbital swelling, with ecchymosis. No associated abrasion or laceration.  Eyes: Conjunctivae and EOM are normal. Pupils are equal, round, and reactive to light.  Neck: Normal range of motion and phonation normal. Neck supple.  Cardiovascular: Normal rate, regular rhythm and intact distal pulses.   Pulmonary/Chest: Effort normal and breath sounds normal. She has no wheezes. She exhibits no tenderness (no chest wall crepitation, or deformity).  Abdominal: Soft. She exhibits no distension. There is no tenderness. There is no guarding.   Musculoskeletal: Normal range of motion.  Neurological: She is  alert and oriented to person, place, and time. She exhibits normal muscle tone.  No dysarthria, aphasia or nystagmus. Mildly abnormal left finger pointing. Normal right finger-pointing. Normal heel-to-shin bilaterally. No truncal ataxia, was sitting.  Skin: Skin is warm and dry.  Psychiatric: She has a normal mood and affect. Her behavior is normal. Judgment and thought content normal.    ED Course  Procedures (including critical care time)  Medications  0.9 %  sodium chloride infusion ( Intravenous New Bag/Given 11/03/13 1450)    Patient Vitals for the past 24 hrs:  BP Temp Pulse Resp SpO2 Height Weight  11/03/13 1700 150/87 mmHg - 99 23 97 % - -  11/03/13 1530 137/85 mmHg - 77 18 99 % - -  11/03/13 1505 134/80 mmHg - 104 - - - -  11/03/13 1502 137/87 mmHg - 101 - - - -  11/03/13 1500 125/80 mmHg - 64 - - - -  11/03/13 1401 - - - - 98 % - -  11/03/13 1356 142/72 mmHg 98.2 F (36.8 C) 92 22 98 % 5\' 1"  (1.549 m) 100 lb (45.36 kg)  11/03/13 1350 - - - - 98 % - -    1735 PM Reevaluation with update and discussion. After initial assessment and treatment, an updated evaluation reveals . She is comfortable. She has had a wound of the right LE for 1 week, it drains some. The wound appears to be a superficial abrasion with slight swelling, no fluctuance of the right posterior ankle.I discussed with patient and family member, all questions answered. [place] Review Labs Reviewed  CBC WITH DIFFERENTIAL - Abnormal; Notable for the following:    RBC 3.77 (*)    Monocytes Relative 13 (*)    Monocytes Absolute 1.1 (*)    All other components within normal limits  BASIC METABOLIC PANEL - Abnormal; Notable for the following:    GFR calc non Af Amer 79 (*)    All other components within normal limits  URINALYSIS, ROUTINE W REFLEX MICROSCOPIC - Abnormal; Notable for the following:    Leukocytes, UA TRACE (*)    All  other components within normal limits  URINE CULTURE  URINE MICROSCOPIC-ADD ON   Imaging Review Ct Head Wo Contrast  11/03/2013   CLINICAL DATA:  Fall. Dizziness. Atrial fibrillation. Right periorbital bruising. Lateral right scalp hematoma. Anemia. CVA.  EXAM: CT HEAD WITHOUT CONTRAST  CT MAXILLOFACIAL WITHOUT CONTRAST (orbits)  CT CERVICAL SPINE WITHOUT CONTRAST  TECHNIQUE: Multidetector CT imaging of the head, cervical spine, and maxillofacial structures (orbits) were performed using the standard protocol without intravenous contrast. Multiplanar CT image reconstructions of the cervical spine and maxillofacial structures were also generated.  COMPARISON:  MR MRA HEAD W/O CM dated 12/20/2012; CT HEAD W/O CM dated 12/19/2012  FINDINGS: CT HEAD FINDINGS  The brainstem, cerebellum, cerebral peduncles, thalamus, basal ganglia, basilar cisterns, and ventricular system appear within normal limits. Periventricular white matter and corona radiata hypodensities favor chronic ischemic microvascular white matter disease. No intracranial hemorrhage, mass lesion, or acute CVA. Right frontal scalp hematoma extends to the superior orbital region.  CT ORBITS FINDINGS  Moderate size right frontal scalp hematoma along the for to head, extending into the right periorbital region. No underlying fracture observed. No post septal/intraorbital abnormality observed. The globes appear symmetric.  CT CERVICAL SPINE FINDINGS  Dextroconvex cervical scoliosis. There is 2 mm of degenerative posterior subluxation at C2-3 with severe loss of intervertebral disc height at advanced degenerative  endplate findings. There is 2.5 mm anterior subluxation at C6-7 believed to be degenerative given the lack of fracture and the degree of facet arthropathy. There is 4 mm of anterolisthesis at C7-T1, with advanced (left greater than right) facet arthropathy no fracture. This subluxation appears to been present on the MRI of 12/20/2012 sagittal T1  weighted images.  Prominent loss of disc height also noted at C4-5. Posterior osseous ridging at C3-4, C4-5, and C5-6. In conjunction with facet arthropathy this causes osseous foraminal stenosis on the left at C3-4, C4-5, C6-7, and C7-T1; and on the right side at C4-5.  The C4 and C5 spinous processes appear to a partially fused. Multilevel severe facet arthropathy is present, particularly on the left.  Biapical pleural parenchymal scarring in the lungs.  IMPRESSION: 1. Right frontal scalp hematoma extends to the periorbital region, without underlying bony abnormality or intraorbital abnormality. 2. No acute intracranial findings. 3. Periventricular white matter and corona radiata hypodensities favor chronic ischemic microvascular white matter disease. 4. Dextroconvex cervical scoliosis with advanced spondylosis and multilevel subluxations which appear chronic. Multilevel osseous foraminal narrowing but no fracture or acute subluxation identified.   Electronically Signed   By: Sherryl Barters M.D.   On: 11/03/2013 15:13   Ct Cervical Spine Wo Contrast  11/03/2013   CLINICAL DATA:  Fall. Dizziness. Atrial fibrillation. Right periorbital bruising. Lateral right scalp hematoma. Anemia. CVA.  EXAM: CT HEAD WITHOUT CONTRAST  CT MAXILLOFACIAL WITHOUT CONTRAST (orbits)  CT CERVICAL SPINE WITHOUT CONTRAST  TECHNIQUE: Multidetector CT imaging of the head, cervical spine, and maxillofacial structures (orbits) were performed using the standard protocol without intravenous contrast. Multiplanar CT image reconstructions of the cervical spine and maxillofacial structures were also generated.  COMPARISON:  MR MRA HEAD W/O CM dated 12/20/2012; CT HEAD W/O CM dated 12/19/2012  FINDINGS: CT HEAD FINDINGS  The brainstem, cerebellum, cerebral peduncles, thalamus, basal ganglia, basilar cisterns, and ventricular system appear within normal limits. Periventricular white matter and corona radiata hypodensities favor chronic ischemic  microvascular white matter disease. No intracranial hemorrhage, mass lesion, or acute CVA. Right frontal scalp hematoma extends to the superior orbital region.  CT ORBITS FINDINGS  Moderate size right frontal scalp hematoma along the for to head, extending into the right periorbital region. No underlying fracture observed. No post septal/intraorbital abnormality observed. The globes appear symmetric.  CT CERVICAL SPINE FINDINGS  Dextroconvex cervical scoliosis. There is 2 mm of degenerative posterior subluxation at C2-3 with severe loss of intervertebral disc height at advanced degenerative endplate findings. There is 2.5 mm anterior subluxation at C6-7 believed to be degenerative given the lack of fracture and the degree of facet arthropathy. There is 4 mm of anterolisthesis at C7-T1, with advanced (left greater than right) facet arthropathy no fracture. This subluxation appears to been present on the MRI of 12/20/2012 sagittal T1 weighted images.  Prominent loss of disc height also noted at C4-5. Posterior osseous ridging at C3-4, C4-5, and C5-6. In conjunction with facet arthropathy this causes osseous foraminal stenosis on the left at C3-4, C4-5, C6-7, and C7-T1; and on the right side at C4-5.  The C4 and C5 spinous processes appear to a partially fused. Multilevel severe facet arthropathy is present, particularly on the left.  Biapical pleural parenchymal scarring in the lungs.  IMPRESSION: 1. Right frontal scalp hematoma extends to the periorbital region, without underlying bony abnormality or intraorbital abnormality. 2. No acute intracranial findings. 3. Periventricular white matter and corona radiata hypodensities favor chronic ischemic microvascular white matter  disease. 4. Dextroconvex cervical scoliosis with advanced spondylosis and multilevel subluxations which appear chronic. Multilevel osseous foraminal narrowing but no fracture or acute subluxation identified.   Electronically Signed   By: Sherryl Barters M.D.   On: 11/03/2013 15:13   Ct Orbitss W/o Cm  11/03/2013   CLINICAL DATA:  Fall. Dizziness. Atrial fibrillation. Right periorbital bruising. Lateral right scalp hematoma. Anemia. CVA.  EXAM: CT HEAD WITHOUT CONTRAST  CT MAXILLOFACIAL WITHOUT CONTRAST (orbits)  CT CERVICAL SPINE WITHOUT CONTRAST  TECHNIQUE: Multidetector CT imaging of the head, cervical spine, and maxillofacial structures (orbits) were performed using the standard protocol without intravenous contrast. Multiplanar CT image reconstructions of the cervical spine and maxillofacial structures were also generated.  COMPARISON:  MR MRA HEAD W/O CM dated 12/20/2012; CT HEAD W/O CM dated 12/19/2012  FINDINGS: CT HEAD FINDINGS  The brainstem, cerebellum, cerebral peduncles, thalamus, basal ganglia, basilar cisterns, and ventricular system appear within normal limits. Periventricular white matter and corona radiata hypodensities favor chronic ischemic microvascular white matter disease. No intracranial hemorrhage, mass lesion, or acute CVA. Right frontal scalp hematoma extends to the superior orbital region.  CT ORBITS FINDINGS  Moderate size right frontal scalp hematoma along the for to head, extending into the right periorbital region. No underlying fracture observed. No post septal/intraorbital abnormality observed. The globes appear symmetric.  CT CERVICAL SPINE FINDINGS  Dextroconvex cervical scoliosis. There is 2 mm of degenerative posterior subluxation at C2-3 with severe loss of intervertebral disc height at advanced degenerative endplate findings. There is 2.5 mm anterior subluxation at C6-7 believed to be degenerative given the lack of fracture and the degree of facet arthropathy. There is 4 mm of anterolisthesis at C7-T1, with advanced (left greater than right) facet arthropathy no fracture. This subluxation appears to been present on the MRI of 12/20/2012 sagittal T1 weighted images.  Prominent loss of disc height also noted at C4-5.  Posterior osseous ridging at C3-4, C4-5, and C5-6. In conjunction with facet arthropathy this causes osseous foraminal stenosis on the left at C3-4, C4-5, C6-7, and C7-T1; and on the right side at C4-5.  The C4 and C5 spinous processes appear to a partially fused. Multilevel severe facet arthropathy is present, particularly on the left.  Biapical pleural parenchymal scarring in the lungs.  IMPRESSION: 1. Right frontal scalp hematoma extends to the periorbital region, without underlying bony abnormality or intraorbital abnormality. 2. No acute intracranial findings. 3. Periventricular white matter and corona radiata hypodensities favor chronic ischemic microvascular white matter disease. 4. Dextroconvex cervical scoliosis with advanced spondylosis and multilevel subluxations which appear chronic. Multilevel osseous foraminal narrowing but no fracture or acute subluxation identified.   Electronically Signed   By: Sherryl Barters M.D.   On: 11/03/2013 15:13     EKG Interpretation None      MDM   Final diagnoses:  Head injury  Contusion of face  Open wound of right lower leg   Fall secondary to persistent, ongoing, dizziness. No evidence for acute metabolic, infectious, or vascular disorders.  Nursing Notes Reviewed/ Care Coordinated Applicable Imaging Reviewed Interpretation of Laboratory Data incorporated into ED treatment  The patient appears reasonably screened and/or stabilized for discharge and I doubt any other medical condition or other [facility] requiring further screening, evaluation, or treatment in the ED at this time prior to discharge.  Plan: Home Medications- usual; Home Treatments- rest, cryotherapy face, warm soaks right LE wound; return here if the recommended treatment, does not improve the symptoms; Recommended follow up- PCP  prn    Richarda Blade, MD 11/03/13 [phone]

## 2013-11-03 NOTE — ED Notes (Signed)
Patient transported to CT 

## 2013-11-03 NOTE — Discharge Instructions (Signed)
For the swelling around the forehead and eye, use an ice pack 3 or 4 times a day for 30 minutes. For the sore on her right lower leg, use a warm compress on it 2 or 3 times a day, and clean regularly with soap and water; until it heals. Return here, if needed, for problems.     Contusion A contusion is a deep bruise. Contusions happen when an injury causes bleeding under the skin. Signs of bruising include pain, puffiness (swelling), and discolored skin. The contusion may turn blue, purple, or yellow. HOME CARE   Put ice on the injured area.  Put ice in a plastic bag.  Place a towel between your skin and the bag.  Leave the ice on for 15-20 minutes, 03-04 times a day.  Only take medicine as told by your doctor.  Rest the injured area.  If possible, raise (elevate) the injured area to lessen puffiness. GET HELP RIGHT AWAY IF:   You have more bruising or puffiness.  You have pain that is getting worse.  Your puffiness or pain is not helped by medicine. MAKE SURE YOU:   Understand these instructions.  Will watch your condition.  Will get help right away if you are not doing well or get worse. Document Released: 01/13/2008 Document Revised: 10/19/2011 Document Reviewed: 06/01/2011 [facility] Patient Information 2014 [place], [place].  Facial or Scalp Contusion  A facial or scalp contusion is a deep bruise on the face or head. Contusions happen when an injury causes bleeding under the skin. Signs of bruising include pain, puffiness (swelling), and discolored skin. The contusion may turn blue, purple, or yellow. HOME CARE  Only take medicines as told by your doctor.  Put ice on the injured area.  Put ice in a plastic bag.  Place a towel between your skin and the bag.  Leave the ice on for 20 minutes, 2 3 times a day. GET HELP IF:  You have bite problems.  You have pain when chewing.  You are worried about your face not healing normally. GET HELP RIGHT AWAY IF:    You have severe pain or a headache and medicine does not help.  You are very tired or confused, or your personality changes.  You throw up (vomit).  You have a nosebleed that will not stop.  You see two of everything (double vision) or have blurry vision.  You have fluid coming from your nose or ear.  You have problems walking or using your arms or legs. MAKE SURE YOU:   Understand these instructions.  Will watch your condition.  Will get help right away if you are not doing well or get worse. Document Released: 07/16/2011 Document Revised: 05/17/2013 Document Reviewed: 03/09/2013 [facility] Patient Information 2014 [place], [place].  Head Injury, Adult You have a head injury. Headaches and throwing up (vomiting) are common after a head injury. It should be easy to wake up from sleeping. Sometimes you must stay in the hospital. Most problems happen within the first 24 hours. Side effects may occur up to 7 10 days after the injury.  WHAT ARE THE TYPES OF HEAD INJURIES? Head injuries can be as minor as a bump. Some head injuries can be more severe. More severe head injuries include:  A jarring injury to the brain (concussion).  A bruise of the brain (contusion). This mean there is bleeding in the brain that can cause swelling.  A cracked skull (skull fracture).  Bleeding in the brain that collects,  clots, and forms a bump (hematoma). . WHEN SHOULD I GET HELP RIGHT AWAY?   You are confused or sleepy.  You cannot be woken up.  You feel sick to your stomach (nauseous) or keep throwing up.  Your dizziness or unsteadiness is get worse.  You have very bad, lasting headaches that are not helped by medicine.  You cannot use your arms or legs like normal  You cannot walk.  You notice changes in the black spots in the center of the colored part of your eye (pupil).  You have clear or bloody fluid coming from your nose or ears.  You have trouble seeing. During the next 24  hours after the injury, you must stay with someone who can watch you. This person should get help right away (call 911 in the [place] if you start to shake and are not able to control it (seizures), you become pass out, or you are unable to wake up. HOW CAN I PREVENT A HEAD INJURY IN THE FUTURE?  Wear seat belts.  Wear helmets while bike riding and playing sports like football.  Stay away from dangerous activities around the house. WHEN CAN I RETURN TO NORMAL ACTIVITIES AND ATHLETICS? See your doctor before doing these activities. You should not do normal activities or play contact sports until 1 week after the following symptoms have stopped:  Headache that does not go away.  Dizziness.  Poor attention.  Confusion.  Memory problems.  Sickness to your stomach or throwing up.  Tiredness.  Fussiness.  Bothered by bright lights or loud noises.  Anxiousness or depression.  Restless sleep. MAKE SURE YOU:   Understand these instructions.  Will watch your condition.  Will get help right away if you are not doing well or get worse. Document Released: 07/09/2008 Document Revised: 05/17/2013 Document Reviewed: 04/03/2013 [facility] Patient Information 2014 [place].  Wound Care Wound care helps prevent pain and infection.  You may need a tetanus shot if:  You cannot remember when you had your last tetanus shot.  You have never had a tetanus shot.  The injury broke your skin. If you need a tetanus shot and you choose not to have one, you may get tetanus. Sickness from tetanus can be serious. HOME CARE   Only take medicine as told by your doctor.  Clean the wound daily with mild soap and water.  Change any bandages (dressings) as told by your doctor.  Put medicated cream and a bandage on the wound as told by your doctor.  Change the bandage if it gets wet, dirty, or starts to smell.  Take showers. Do not take baths, swim, or do anything that puts your wound  under water.  Rest and raise (elevate) the wound until the pain and puffiness (swelling) are better.  Keep all doctor visits as told. GET HELP RIGHT AWAY IF:   Yellowish-white fluid (pus) comes from the wound.  Medicine does not lessen your pain.  There is a red streak going away from the wound.  You have a fever. MAKE SURE YOU:   Understand these instructions.  Will watch your condition.  Will get help right away if you are not doing well or get worse. Document Released: 05/05/2008 Document Revised: 10/19/2011 Document Reviewed: 11/30/2010 [facility] Patient Information 2014 [place], [place].

## 2013-11-03 NOTE — ED Notes (Addendum)
80 yo female from [facility] with c/o Fall this morning at 0530. Reports dizziness for days and recent Falls. HX of Falls, A.fib (Eloquis Blood Thinner). R eye is Bruised and R head lateral side has Hematoma. No LOC, Back or Neck injury. Reports No pain.  DNR  Vitals  135/89 HR 89 irreg 97% RA A.febrile

## 2013-11-04 LAB — URINE CULTURE: Colony Count: 85000

## 2013-11-06 ENCOUNTER — Non-Acute Institutional Stay (SKILLED_NURSING_FACILITY): Payer: Medicare PPO | Admitting: Geriatric Medicine

## 2013-11-06 DIAGNOSIS — Z66 Do not resuscitate: Secondary | ICD-10-CM

## 2013-11-06 DIAGNOSIS — K625 Hemorrhage of anus and rectum: Secondary | ICD-10-CM

## 2013-11-06 DIAGNOSIS — H8110 Benign paroxysmal vertigo, unspecified ear: Secondary | ICD-10-CM

## 2013-11-07 ENCOUNTER — Non-Acute Institutional Stay (SKILLED_NURSING_FACILITY): Payer: Medicare PPO | Admitting: Internal Medicine

## 2013-11-07 DIAGNOSIS — S0990XA Unspecified injury of head, initial encounter: Secondary | ICD-10-CM

## 2013-11-07 DIAGNOSIS — S0083XA Contusion of other part of head, initial encounter: Secondary | ICD-10-CM

## 2013-11-07 DIAGNOSIS — S91009A Unspecified open wound, unspecified ankle, initial encounter: Secondary | ICD-10-CM

## 2013-11-07 DIAGNOSIS — F0390 Unspecified dementia without behavioral disturbance: Secondary | ICD-10-CM

## 2013-11-07 DIAGNOSIS — S1093XA Contusion of unspecified part of neck, initial encounter: Secondary | ICD-10-CM

## 2013-11-07 DIAGNOSIS — I4891 Unspecified atrial fibrillation: Secondary | ICD-10-CM

## 2013-11-07 DIAGNOSIS — Z7901 Long term (current) use of anticoagulants: Secondary | ICD-10-CM

## 2013-11-07 DIAGNOSIS — S0093XA Contusion of unspecified part of head, initial encounter: Secondary | ICD-10-CM

## 2013-11-07 DIAGNOSIS — S81801A Unspecified open wound, right lower leg, initial encounter: Secondary | ICD-10-CM

## 2013-11-07 DIAGNOSIS — S0003XA Contusion of scalp, initial encounter: Secondary | ICD-10-CM

## 2013-11-07 DIAGNOSIS — S81009A Unspecified open wound, unspecified knee, initial encounter: Secondary | ICD-10-CM

## 2013-11-07 DIAGNOSIS — S81809A Unspecified open wound, unspecified lower leg, initial encounter: Secondary | ICD-10-CM

## 2013-11-08 ENCOUNTER — Encounter: Payer: Self-pay | Admitting: Geriatric Medicine

## 2013-11-08 DIAGNOSIS — H8110 Benign paroxysmal vertigo, unspecified ear: Secondary | ICD-10-CM

## 2013-11-08 DIAGNOSIS — K625 Hemorrhage of anus and rectum: Secondary | ICD-10-CM | POA: Insufficient documentation

## 2013-11-08 HISTORY — DX: Benign paroxysmal vertigo, unspecified ear: H81.10

## 2013-11-08 NOTE — Progress Notes (Signed)
Patient ID: Barbara [name], female   DOB: 05-30-1929, 80 y.o.   MRN: [id number]   [facility] ([facility])  Code Status: DNR  Contact Information   Name Megargel Daughter [phone]        Chief Complaint  Patient presents with  . Follow-up    Dizzy, fall     HPI: This is a 80 y.o. female resident of [facility] section admitted to the rehabilitation section after emergency department evaluation on 11/03/2013. Patient had an episode of severe dizziness. Earlier in the day patient had a self-reported fall in the bathroom, she hit her head at that time. There was no loss of consciousness. She was being treated with ice to the forehead and close monitoring.Patient had been reporting intermittent episodes of dizziness for last several weeks. Due to increased severity of dizziness after the fall, On-call provider was notified who recommended ED evaluation.   Head, neck and brain imaging were negative for acute changes, lab studies were satisfactory. Patient was discharged to the Rehabilitation section at [facility] later that evening. Patient reports no further dizziness since arrival in the rehabilitation section, reviewed nurse's notes show no observations related to dizziness. Patient has been noted to be more forgetful than usual; she has been wandering the bed looking for her assisted-living apartment. [name] reports patient was experiencing dizziness in the emergency room while she was lying down, specifically when turned side to side she felt very dizzy.    Allergies  Allergen Reactions  . Aricept [Donepezil Hcl]     Muscle pain    MEDICATIONS -  Reviewed  DATA REVIEWED  Radiologic Exams 11/03/2013 15:13 CT HEAD WITHOUT CONTRAST  CT MAXILLOFACIAL WITHOUT CONTRAST (orbits)  CT CERVICAL SPINE WITHOUT CONTRAST    COMPARISON:  MR MRA HEAD W/O CM dated 12/20/2012; CT HEAD W/O CM  dated 12/19/2012    IMPRESSION: 1. Right frontal scalp hematoma extends to the periorbital region, without underlying bony abnormality or intraorbital abnormality. 2. No acute intracranial findings. 3. Periventricular white matter and corona radiata hypodensities favor chronic ischemic microvascular white matter disease. 4. Dextroconvex cervical scoliosis with advanced spondylosis and multilevel subluxations which appear chronic. Multilevel osseous foraminal narrowing but no fracture or acute subluxation identified.   Cardiovascular Exams:   Laboratory Studies: Lab Results  Component Value Date   WBC 8.5 11/03/2013   HGB 12.4 11/03/2013   HCT 36.4 11/03/2013   MCV 96.6 11/03/2013   PLT 280 11/03/2013   Lab Results  Component Value Date   NA 138 11/03/2013   K 4.4 11/03/2013   BUN 17 11/03/2013   CREATININE 0.70 11/03/2013    URINE, CATHETERIZED   Culture Setup Time  11/03/2013 19:22 Performed at [facility]  85,000 COLONIES/ML Performed at [facility] Note: Standardized susceptibility testing for this organism is not available. Performed at [facility]  Report Status  11/04/2013 FINAL     REVIEW OF SYSTEMS  DATA OBTAINED: from patient, nurse, medical record, family member GENERAL: Feels well   No recent fever, fatigue, change in appetite or weight SKIN: No itch, rash or open wounds EYES: No eye pain, dryness or itching  No change in vision (chronic low vision) EARS: No earache, tinnitus, change in hearing NOSE: No congestion, drainage or bleeding MOUTH/THROAT: No mouth or tooth pain  No sore throat   No difficulty chewing or swallowing RESPIRATORY: No  cough, wheezing, SOB CARDIAC: No chest pain, palpitations  No edema. GI: No abdominal pain  No nausea, vomiting,diarrhea or constipation  No heartburn or reflux  GU: No dysuria, frequency or urgency  No change in urine volume or character    MUSCULOSKELETAL: No joint pain, swelling or stiffness  No back pain  No muscle ache, pain, weakness  Gait is unsteady  Recent falls.  NEUROLOGIC: Dizziness,see HPINo fainting, headache, numbness  No change in mental status.  PSYCHIATRIC: No feelings of anxiety, depression  Sleeps well.  Increased forgetfulness    PHYSICAL EXAM Filed Vitals:   11/06/13 1325  BP: 118/77  Pulse: 80  Temp: 97.3 F (36.3 C)  Resp: 16  SpO2: 97%   There is no weight on file to calculate BMI.  GENERAL APPEARANCE: No acute distress, appropriately groomed, normal body habitus. Alert, pleasant, conversant. SKIN: No diaphoresis, rash, right forehead with a 4 cm hematoma, significant ecchymosis of the forehead right periorbital right cheek area, ecchymosis is spreading to the neck right neck as well  HEAD: Normocephalic,  EYES: Conjunctiva/lids clear. Pupils round, reactive, EOM intact, no nystagmus noted low vision, chronic EARS: External exam WNL, diminished Hearing, chronic  NOSE: No deformity or discharge. MOUTH/THROAT: Lips w/o lesions. Oral mucosa, tongue moist, w/o lesion. Oropharynx w/o redness or lesions.  NECK: Supple, full ROM. No thyroid tenderness, enlargement or nodule LYMPHATICS: No head, neck or supraclavicular adenopathy RESPIRATORY: Breathing is even, unlabored. Lung sounds are clear and full.  CARDIOVASCULAR: Heart IRRR. No murmur or extra heart sounds  ARTERIAL: No carotid bruit. Carotid, Femoral, Popliteal, DP,PT pulse 2+.  EDEMA: No peripheral edema.  GASTROINTESTINAL: Abdomen is soft, non-tender, not distended w/ normal bowel sounds.  MUSCULOSKELETAL: Moves all extremities with full ROM, strength and tone. Back with mild kyphosis, scoliosis present, no spinal process tenderness. Rises from chair to walker easily, ambulates in the room, maneuvers walker safely. No complaints of dizziness   NEUROLOGIC: Oriented to time, place, person. Cranial nerves 2-12 grossly intact, speech clear, no  tremor. PSYCHIATRIC: Mood and affect appropriate to situation   ASSESSMENT/PLAN  Benign paroxysmal positional vertigo Intermittent dizziness, sometimes severe. Dizziness noted lying in bed with position changes, this may be BPPV. Will also stop ibuprofen as this medication can contribute to dizziness, also is contraindicated with her recent GI bleeding. Will ask PT to evaluate /treat if appropriate  Rectal bleeding Recent very mild drop in hematocrit/ hemoglobin accompanied by Hemoccult-positive stools. Patient is anticoagulated with Eliquis for stroke risk reduction related to atrial fibrillation. She has been evaluated by gastroenterology, Dr.Buccini. He's reviewed her history extensively, notes she's had a previous history of colon polyp. He has recommended repeat colonoscopy, this is scheduled 11/21/2013. Will continue anticoagulation with a request until the day prior to this procedure, resume the day after.     Family/ staff Communication:  Discussed findings and plans with patient's daughter   Labs/tests ordered: PT BPPV evaluation   Follow up: Return for As needed.  Mardene Celeste, NP-C [facility] [phone]  11/08/2013

## 2013-11-08 NOTE — Assessment & Plan Note (Signed)
Recent very mild drop in hematocrit/ hemoglobin accompanied by Hemoccult-positive stools. Patient is anticoagulated with Eliquis for stroke risk reduction related to atrial fibrillation. She has been evaluated by gastroenterology, Dr.Buccini. He's reviewed her history extensively, notes she's had a previous history of colon polyp. He has recommended repeat colonoscopy, this is scheduled 11/21/2013. Will continue anticoagulation with a request until the day prior to this procedure, resume the day after.

## 2013-11-08 NOTE — Assessment & Plan Note (Signed)
Intermittent dizziness, sometimes severe. Dizziness noted lying in bed with position changes, this may be BPPV. Will also stop ibuprofen as this medication can contribute to dizziness, also is contraindicated with her recent GI bleeding. Will ask PT to evaluate /treat if appropriate

## 2013-11-13 ENCOUNTER — Encounter: Payer: Self-pay | Admitting: Internal Medicine

## 2013-11-13 DIAGNOSIS — S81801A Unspecified open wound, right lower leg, initial encounter: Secondary | ICD-10-CM | POA: Insufficient documentation

## 2013-11-13 HISTORY — DX: Unspecified open wound, right lower leg, initial encounter: S81.801A

## 2013-11-13 NOTE — Progress Notes (Signed)
Patient ID: Barbara [name], female   DOB: 1929/04/01, 80 y.o.   MRN: [id number]    Location:  [facility] of Service: SNF (31)  PCP: Estill Dooms, MD  Code Status: DNR  Extended Emergency Contact Information Primary Emergency Contact: Kathi Ludwig [place] Phone: [phone] Relation: Daughter  Allergies  Allergen Reactions  . Aricept [Donepezil Hcl]     Muscle pain    Chief Complaint  Patient presents with  . New Evaluation    transferred to Rehab[facility] SNF from [facility] after a fall with head injury    HPI:  Transferred from [facility] to [facility]/ SNF on 11/03/13 following a fall that caused head injury and contusion of the face. Also had an open wound of the RLE. Needs increased nursing care and supervision. Has had dizziness that makes her unstable on standing.  Contusion of head: large hematoma and ecchymosis of the right supraorbital area, face and right neck. Periorbital bruising bilaterally with R>L.  Wound of right leg: small laceration  Atrial fibrillation: chronic and anticoagulated  Senile dementia, uncomplicated: mild  Long term (current) use of anticoagulants: contributes to the hematoma size.      Past Medical History  Diagnosis Date  . Atrial fibrillation 2009    Long term anticoagulation. Warfarin changed to Xarelto 06/2012  . Cystocele, midline   . Senile dementia, uncomplicated   . Generalized anxiety disorder   . Unspecified glaucoma   . Macular degeneration (senile) of retina, unspecified   . Osteoarthrosis, unspecified whether generalized or localized, unspecified site   . Senile osteoporosis   . Pneumonia, organism unspecified 03/2012    Tx w/ PO antibx  . Unspecified vitamin D deficiency   . Unspecified venous (peripheral) insufficiency   . Tear film insufficiency, unspecified 08/2012  . Debility, unspecified 04/01/2012  . Long term (current) use of anticoagulants 04/01/2012  .  Stroke   . CVA (cerebral infarction) 11/19/2012  . Anemia 11/2012    transfusion 11/2012  . Unspecified hypothyroidism   . Other and unspecified hyperlipidemia     Atorvastatin started 12/2012  . Benign paroxysmal positional vertigo 11/08/2013    Past Surgical History  Procedure Laterality Date  . Tonsillectomy and adenoidectomy    . Umbilical hernia repair    . Cataract extraction w/ intraocular lens  implant, bilateral    . Spine surgery      RE; Spinal stenosis    CONSULTANTS Cardio: Skains Gen Surg: D. Newman GI: Buccini  PAST PROCEDURES 11/03/13 CT HEAD FINDINGS  The brainstem, cerebellum, cerebral peduncles, thalamus, basal  ganglia, basilar cisterns, and ventricular system appear within  normal limits. Periventricular white matter and corona radiata  hypodensities favor chronic ischemic microvascular white matter  disease. No intracranial hemorrhage, mass lesion, or acute CVA.  Right frontal scalp hematoma extends to the superior orbital region.  CT ORBITS FINDINGS  Moderate size right frontal scalp hematoma along the for to head,  extending into the right periorbital region. No underlying fracture  observed. No post septal/intraorbital abnormality observed. The  globes appear symmetric.  CT CERVICAL SPINE FINDINGS  Dextroconvex cervical scoliosis. There is 2 mm of degenerative  posterior subluxation at C2-3 with severe loss of intervertebral  disc height at advanced degenerative endplate findings. There is 2.5  mm anterior subluxation at C6-7 believed to be degenerative given  the lack of fracture and the degree of facet arthropathy. There is  4  mm of anterolisthesis at C7-T1, with advanced (left greater than  right) facet arthropathy no fracture. This subluxation appears to  been present on the MRI of 12/20/2012 sagittal T1 weighted images.  Prominent loss of disc height also noted at C4-5. Posterior osseous  ridging at C3-4, C4-5, and C5-6. In conjunction with facet    arthropathy this causes osseous foraminal stenosis on the left at  C3-4, C4-5, C6-7, and C7-T1; and on the right side at C4-5.  The C4 and C5 spinous processes appear to a partially fused.  Multilevel severe facet arthropathy is present, particularly on the  left.  Biapical pleural parenchymal scarring in the lungs.  IMPRESSION:  1. Right frontal scalp hematoma extends to the periorbital region,  without underlying bony abnormality or intraorbital abnormality.  2. No acute intracranial findings.  3. Periventricular white matter and corona radiata hypodensities  favor chronic ischemic microvascular white matter disease.  4. Dextroconvex cervical scoliosis with advanced spondylosis and  multilevel subluxations which appear chronic. Multilevel osseous  foraminal narrowing but no fracture or acute subluxation identified    Social History: History   Social History  . Marital Status: Widowed    Spouse Name: N/A    Number of Children: N/A  . Years of Education: N/A   Occupational History  . retired [employer] cabin [employer]    Social History Main Topics  . Smoking status: Former Smoker    Types: Cigarettes    Quit date: 03/08/1981  . Smokeless tobacco: Never Used  . Alcohol Use: No  . Drug Use: No  . Sexual Activity: No   Other Topics Concern  . Not on file   Social History Narrative   Widowed. Lives in [place] section of [place] since 04/2012. Retired Pharmacist, hospital, also worked in [employer]. Stopped cigarettes 1980, no significant alcohol history. Stopped driving 10/6466([id number] degenration).     Family History Family Status  Relation Status Death Age  . Mother Deceased     "old age"  . Father Deceased   . Brother Alive   . Daughter Alive   . Son Alive   . Brother Alive   . Daughter Alive    Family History  Problem Relation Age of Onset  . Heart attack Father      Medications: Patient's Medications  New Prescriptions   No medications on file   Previous Medications   ACETAMINOPHEN (TYLENOL) 325 MG TABLET    Take 650 mg by mouth 2 (two) times daily.   ATORVASTATIN (LIPITOR) 10 MG TABLET    Take 10 mg by mouth daily.   CALCIUM CARBONATE-VITAMIN D (CALTRATE 600+D) 600-400 MG-UNIT PER CHEW TABLET    Chew 1 tablet by mouth daily.   CARBOXYMETHYLCELLULOSE (REFRESH PLUS) 0.5 % SOLN    Place 1 drop into both eyes every 4 (four) hours as needed.    CHOLECALCIFEROL (VITAMIN D) 1000 UNITS TABLET    Take 1,000 Units by mouth daily.   DILTIAZEM (TIAZAC) 360 MG 24 HR CAPSULE    Take 360 mg by mouth daily.   DORZOLAMIDE-TIMOLOL (COSOPT) 22.3-6.8 MG/ML OPHTHALMIC SOLUTION    Place 1 drop into both eyes 2 (two) times daily.   ELIQUIS 2.5 MG TABS TABLET    Take 2.5 mg by mouth 2 (two) times daily.    LEVOTHYROXINE (SYNTHROID, LEVOTHROID) 75 MCG TABLET    Take 75 mcg by mouth daily before breakfast.   MENTHOL, TOPICAL ANALGESIC, (BENGAY COLD THERAPY) 5 % GEL    Apply 1  application topically 4 (four) times daily. Apply to lower back and/or bilateral hips with pain up to 4 times daily.   MULTIPLE VITAMINS-MINERALS (PRESERVISION/LUTEIN) CAPS    Take 2 capsules by mouth daily.   POLYETHYLENE GLYCOL (MIRALAX / GLYCOLAX) PACKET    Take 17 g by mouth daily. Mix with 6oz beverage of choice. Hold for loose stool   SENNOSIDES-DOCUSATE SODIUM (SENOKOT-S) 8.6-50 MG TABLET    Take 1 tablet by mouth at bedtime as needed for constipation.    SERTRALINE (ZOLOFT) 50 MG TABLET    Take 50 mg by mouth at bedtime.   TERIPARATIDE, RECOMBINANT, (FORTEO) 600 MCG/2.4ML SOLN    Inject 20 mcg into the skin every morning.   TRAVATAN Z 0.004 % SOLN OPHTHALMIC SOLUTION    Place 1 drop into both eyes daily.   Modified Medications   No medications on file  Discontinued Medications   No medications on file    Immunization History  Administered Date(s) Administered  . Influenza Whole 04/29/2012  . Influenza-Unspecified 05/24/2013  . Pneumococcal Polysaccharide-23 01/31/2009  .  Td 11/29/2001  . Tdap 07/19/2011  . Zoster 01/10/2002, 01/09/2006     Review of Systems  Constitutional: Positive for activity change. Negative for fever, chills, diaphoresis, appetite change and fatigue.  HENT: Positive for hearing loss.        Right facial weakness  Eyes:       Wears prescription lenses  Respiratory: Negative for cough, choking, chest tightness, shortness of breath, wheezing and stridor.   Cardiovascular: Negative for chest pain, palpitations and leg swelling.        History chronic atrial fibrillation  Gastrointestinal: Negative for abdominal pain and abdominal distention.  Endocrine: Negative.   Genitourinary:       Urgency and incontinence  Musculoskeletal:       Right-sided weakness.  Skin:       Hematoma on the right supraorbital ridge. Right face with ecchymosis extending down the neck.  Allergic/Immunologic: Negative.   Neurological: Positive for dizziness.       History of right facial weakness and right hemiparesis. Dementia.  Hematological:       Hx anemia requiring transfusion. No gross GI bleeding was noted. Hgb nl Aug 2014.  Psychiatric/Behavioral:       Chronic dementia      Filed Vitals:   11/13/13 1218  BP: 100/62  Pulse: 63  Temp: 98.6 F (37 C)  Resp: 20  Height: _0  (1.549 m)  Weight: 106 lb (48.081 kg)  SpO2: 94%   Physical Exam  Constitutional: She appears well-developed and well-nourished. No distress.  HENT:  Right Ear: External ear normal.  Left Ear: External ear normal.  Nose: Nose normal.  Mouth/Throat: Oropharynx is clear and moist. No oropharyngeal exudate.  Eyes: EOM are normal. Pupils are equal, round, and reactive to light.  peeriorbital ecchymoses, R>L.  Neck: No JVD present. No tracheal deviation present. No thyromegaly present.  Cardiovascular: Exam reveals friction rub. Exam reveals no gallop.   No murmur heard. AF  Respiratory: No respiratory distress. She has no wheezes. She has no rales.  GI: She  exhibits no distension and no mass.  Musculoskeletal: Normal range of motion. She exhibits no edema and no tenderness.  Lymphadenopathy:    She has no cervical adenopathy.  Neurological: She is alert. She has normal reflexes. No cranial nerve deficit. Coordination normal.  Memory deficit.  Skin:  Right supraorbital hematoma and right face ecchymosis. Small laceration of the posterior right  calf area.  Psychiatric: She has a normal mood and affect. Her behavior is normal.       Labs reviewed: Admission on 11/03/2013, Discharged on 11/03/2013  Component Date Value Ref Range Status  . WBC 11/03/2013 8.5  4.0 - 10.5 K/uL Final  . RBC 11/03/2013 3.77* 3.87 - 5.11 MIL/uL Final  . Hemoglobin 11/03/2013 12.4  12.0 - 15.0 g/dL Final  . HCT 11/03/2013 36.4  36.0 - 46.0 % Final  . MCV 11/03/2013 96.6  78.0 - 100.0 fL Final  . MCH 11/03/2013 32.9  26.0 - 34.0 pg Final  . MCHC 11/03/2013 34.1  30.0 - 36.0 g/dL Final  . RDW 11/03/2013 13.2  11.5 - 15.5 % Final  . Platelets 11/03/2013 280  150 - 400 K/uL Final  . Neutrophils Relative % 11/03/2013 65  43 - 77 % Final  . Neutro Abs 11/03/2013 5.5  1.7 - 7.7 K/uL Final  . Lymphocytes Relative 11/03/2013 20  12 - 46 % Final  . Lymphs Abs 11/03/2013 1.7  0.7 - 4.0 K/uL Final  . Monocytes Relative 11/03/2013 13* 3 - 12 % Final  . Monocytes Absolute 11/03/2013 1.1* 0.1 - 1.0 K/uL Final  . Eosinophils Relative 11/03/2013 2  0 - 5 % Final  . Eosinophils Absolute 11/03/2013 0.2  0.0 - 0.7 K/uL Final  . Basophils Relative 11/03/2013 0  0 - 1 % Final  . Basophils Absolute 11/03/2013 0.0  0.0 - 0.1 K/uL Final  . Sodium 11/03/2013 138  137 - 147 mEq/L Final  . Potassium 11/03/2013 4.4  3.7 - 5.3 mEq/L Final  . Chloride 11/03/2013 103  96 - 112 mEq/L Final  . CO2 11/03/2013 21  19 - 32 mEq/L Final  . Glucose, Bld 11/03/2013 87  70 - 99 mg/dL Final  . BUN 11/03/2013 17  6 - 23 mg/dL Final  . Creatinine, Ser 11/03/2013 0.70  0.50 - 1.10 mg/dL Final  .  Calcium 11/03/2013 9.1  8.4 - 10.5 mg/dL Final  . GFR calc non Af Amer 11/03/2013 79* >90 mL/min Final  . GFR calc Af Amer 11/03/2013 >90  >90 mL/min Final   Comment: (NOTE)                          The eGFR has been calculated using the CKD EPI equation.                          This calculation has not been validated in all clinical situations.                          eGFR's persistently <90 mL/min signify possible Chronic Kidney                          Disease.  . Color, Urine 11/03/2013 YELLOW  YELLOW Final  . APPearance 11/03/2013 CLEAR  CLEAR Final  . Specific Gravity, Urine 11/03/2013 1.014  1.005 - 1.030 Final  . pH 11/03/2013 6.0  5.0 - 8.0 Final  . Glucose, UA 11/03/2013 NEGATIVE  NEGATIVE mg/dL Final  . Hgb urine dipstick 11/03/2013 NEGATIVE  NEGATIVE Final  . Bilirubin Urine 11/03/2013 NEGATIVE  NEGATIVE Final  . Ketones, ur 11/03/2013 NEGATIVE  NEGATIVE mg/dL Final  . Protein, ur 11/03/2013 NEGATIVE  NEGATIVE mg/dL Final  . Urobilinogen, UA 11/03/2013 0.2  0.0 - 1.0 mg/dL  Final  . Nitrite 11/03/2013 NEGATIVE  NEGATIVE Final  . Leukocytes, UA 11/03/2013 TRACE* NEGATIVE Final  . Specimen Description 11/03/2013 URINE, CATHETERIZED   Final  . Special Requests 11/03/2013 NONE   Final  . Culture  Setup Time 11/03/2013    Final                   Value:11/03/2013 19:22                         Performed at [facility]  . Colony Count 11/03/2013    Final                   Value:85,000 COLONIES/ML                         Performed at [facility]  . Culture 11/03/2013    Final                   Value:LACTOBACILLUS SPECIES                         Note: Standardized susceptibility testing for this organism is not available.                         Performed at [facility]  . Report Status 11/03/2013 11/04/2013 FINAL   Final  . Squamous Epithelial / LPF 11/03/2013 RARE  RARE Final  . WBC, UA 11/03/2013 3-6  <3 WBC/hpf Final  . RBC / HPF 11/03/2013 0-2  <3  RBC/hpf Final  . Bacteria, UA 11/03/2013 RARE  RARE Final  Nursing Home on 08/29/2013  Component Date Value Ref Range Status  . Hemoglobin 07/04/2013 13.1  12.0 - 16.0 g/dL Final  . HCT 07/04/2013 38  36 - 46 % Final  . Platelets 07/04/2013 206  150 - 399 K/L Final  . WBC 07/04/2013 6.2   Final  . TSH 04/06/2013 6.26* 0.41 - 5.90 uIU/mL Final  Office Visit on 08/14/2013  Component Date Value Ref Range Status  . Sodium 08/14/2013 139  135 - 145 mEq/L Final  . Potassium 08/14/2013 3.6  3.5 - 5.1 mEq/L Final  . Chloride 08/14/2013 106  96 - 112 mEq/L Final  . CO2 08/14/2013 26  19 - 32 mEq/L Final  . Glucose, Bld 08/14/2013 104* 70 - 99 mg/dL Final  . BUN 08/14/2013 21  6 - 23 mg/dL Final  . Creatinine, Ser 08/14/2013 0.9  0.4 - 1.2 mg/dL Final  . Calcium 08/14/2013 9.0  8.4 - 10.5 mg/dL Final  . GFR 08/14/2013 62.87  >60.00 mL/min Final  . WBC 08/14/2013 7.1  4.5 - 10.5 K/uL Final  . RBC 08/14/2013 3.95  3.87 - 5.11 Mil/uL Final  . Hemoglobin 08/14/2013 13.0  12.0 - 15.0 g/dL Final  . HCT 08/14/2013 38.4  36.0 - 46.0 % Final  . MCV 08/14/2013 97.3  78.0 - 100.0 fl Final  . MCHC 08/14/2013 33.9  30.0 - 36.0 g/dL Final  . RDW 08/14/2013 13.9  11.5 - 14.6 % Final  . Platelets 08/14/2013 201.0  150.0 - 400.0 K/uL Final  . Neutrophils Relative % 08/14/2013 60.2  43.0 - 77.0 % Final  . Lymphocytes Relative 08/14/2013 25.2  12.0 - 46.0 % Final  . Monocytes Relative 08/14/2013 12.5* 3.0 - 12.0 % Final  . Eosinophils Relative 08/14/2013 1.6  0.0 - 5.0 % Final  . Basophils Relative 08/14/2013 0.5  0.0 - 3.0 % Final  . Neutro Abs 08/14/2013 4.3  1.4 - 7.7 K/uL Final  . Lymphs Abs 08/14/2013 1.8  0.7 - 4.0 K/uL Final  . Monocytes Absolute 08/14/2013 0.9  0.1 - 1.0 K/uL Final  . Eosinophils Absolute 08/14/2013 0.1  0.0 - 0.7 K/uL Final  . Basophils Absolute 08/14/2013 0.0  0.0 - 0.1 K/uL Final  10/06/13 TSH 2.762  Hgb 12.3 10/24/13 Hgb 11.0,  MCV 95.4    Assessment/Plan 1. Head injury,  unspecified Needs observation due to the injury, presence of anticoagulation , and dementia with impulsive behaviors.  2. Contusion of head Hematoma right forehead will be observed  3. Wound of right leg Daily dressing  4. Atrial fibrillation Chronic. Rate controlled  5. Senile dementia, uncomplicated Chronic and unchanged  6. Long term (current) use of anticoagulants Continue due to AF. reassess safety if dizziness and fall risk persist

## 2013-11-14 ENCOUNTER — Encounter: Payer: Self-pay | Admitting: Adult Health

## 2013-11-14 ENCOUNTER — Non-Acute Institutional Stay (SKILLED_NURSING_FACILITY): Payer: Medicare PPO | Admitting: Adult Health

## 2013-11-14 DIAGNOSIS — I4891 Unspecified atrial fibrillation: Secondary | ICD-10-CM

## 2013-11-14 DIAGNOSIS — S81801A Unspecified open wound, right lower leg, initial encounter: Secondary | ICD-10-CM

## 2013-11-14 DIAGNOSIS — H8110 Benign paroxysmal vertigo, unspecified ear: Secondary | ICD-10-CM

## 2013-11-14 DIAGNOSIS — S91009A Unspecified open wound, unspecified ankle, initial encounter: Secondary | ICD-10-CM

## 2013-11-14 DIAGNOSIS — S0093XA Contusion of unspecified part of head, initial encounter: Secondary | ICD-10-CM

## 2013-11-14 DIAGNOSIS — S0003XA Contusion of scalp, initial encounter: Secondary | ICD-10-CM

## 2013-11-14 DIAGNOSIS — S1093XA Contusion of unspecified part of neck, initial encounter: Secondary | ICD-10-CM

## 2013-11-14 DIAGNOSIS — S81809A Unspecified open wound, unspecified lower leg, initial encounter: Secondary | ICD-10-CM

## 2013-11-14 DIAGNOSIS — F0390 Unspecified dementia without behavioral disturbance: Secondary | ICD-10-CM

## 2013-11-14 DIAGNOSIS — S0083XA Contusion of other part of head, initial encounter: Secondary | ICD-10-CM

## 2013-11-14 DIAGNOSIS — S81009A Unspecified open wound, unspecified knee, initial encounter: Secondary | ICD-10-CM

## 2013-11-14 NOTE — Assessment & Plan Note (Signed)
Nursing on rehab reports patient has oriented well to being on rehab. Increased confusion on arrival to unit likely due to change in environment, stress. Will follow for safety issues, as pt lives in assisted living apartment. Home modifications are being made. Pt is aware of her memory issues and is open to labeling item and posting reminders to maintain safety

## 2013-11-14 NOTE — Assessment & Plan Note (Signed)
Today IRRR, rate controlled. Continue current medications including eliquis for anticoagulation

## 2013-11-14 NOTE — Assessment & Plan Note (Signed)
Continue Arnica cream TID for facial and nuchal ecchymosis. Rt brow edema decreasing. Non tender to light palpation. No signs of worsening. Continue to monitor

## 2013-11-14 NOTE — Assessment & Plan Note (Addendum)
Denies any dizziness since Dix-Hallpike maneuvers performed x2 by Physical therapy. Her balance score has improved to 52. She is ambulating steadily with walker. She requires cues for safety and reminders to use walker at all times with ambulation. Plan to return to [place] apartment when home safety modifications are complete

## 2013-11-14 NOTE — Assessment & Plan Note (Signed)
Rt posterior leg wound reportedly worse per nursing. Size increased to 2.3x1.6cm.  <25%slough, >granulation tissue, maceration at distal edge. Tender. No redness. Has been receiving dressing changes on Tues and Friday after bath. Changed wound care to cleanse with NS, apply no sting skin prep to periwound, apply small piece of calcium alginate to wound bed, cover with bordered hydrocolloid dressing. Will reassess Friday 10/17/13

## 2013-11-14 NOTE — Progress Notes (Signed)
Patient ID: Barbara [name], female   DOB: 05-Aug-1929, 80 y.o.   MRN: [id number]   [facility] (31)   Code Status: DNR  Chief Complaint  Patient presents with  . Skin Ulcer  . Dementia  . Dizziness  . Gait Problem  . Atrial Fibrillation  . Discharge Note    HPI:  This is an 80 y.o. Pt on rehab unit at [facility] following a fall in in her assisted living apt on 3/31 resulting in hospital visit. Contusion sustained to Neck, face and brow and a previous scab to rt posterior leg was opened. No other injuries. We are asked to see her today to evaluate BPPV, healing progress, safety issues, as pt is ready to go home to AL. She was treated for BPPV, gait and safety by Physical therapy and OT for functional improvements. Her balance score has improved to 52 with use of walker. Nursing reports pt has oriented well to rehab unit, but does continue to wander around the unit at times. She has short term memory loss and has required cues for safety in the past on AL. She is eating greater than 50% of meals and sleeping well.    Last visit 11/07/13 (Dr. [name]) 1. Head injury, unspecified  Needs observation due to the injury, presence of anticoagulation , and dementia with impulsive behaviors.  2. Contusion of head  Hematoma right forehead will be observed  3. Wound of right leg  Daily dressing  4. Atrial fibrillation  Chronic. Rate controlled  5. Senile dementia, uncomplicated  Chronic and unchanged  6. Long term (current) use of anticoagulants  Continue due to AF. reassess safety if dizziness and fall risk persis    Allergies  Allergen Reactions  . Aricept [Donepezil Hcl]     Muscle pain    MEDICATIONS -  Reviewed. No changes  DATA REVIEWED  Laboratory Studies: Lab Results  Component Value Date   WBC 8.5 11/03/2013   HGB 12.4 11/03/2013   HCT 36.4 11/03/2013   MCV 96.6 11/03/2013   PLT 280 11/03/2013     Lab Results  Component Value Date   NA 138  11/03/2013   K 4.4 11/03/2013   BUN 17 11/03/2013   CREATININE 0.70 11/03/2013     REVIEW OF SYSTEMS    General: Activity has increased and improved. No fever, chills, diaphoresis, appetite change or fatigue. Skin: bruising to face is getting better, no longer requiring ice packs, no longer painful. No sxs active bleeding  Ears: Hearing loss unchanged.  Eyes: Glasses. Poor vision even when wearing. "I can't see a lick"  Respiratory: No cough, choking, chest tightness, shortness of breath, wheezing and stridor.  Cardiovascular: No chest pain, palpitations, leg swelling.  Gastrointestinal: No abdominal pain and abdominal distention.  Musculoskeletal: Denies muscle pain or weakness. Admits to forgetting walker with ambulation at times, but is trying to remember to use it.  Neurological: Denies any dizziness/vertigo symptoms since treated by PT. Dementia unchanged    PHYSICAL EXAM Filed Vitals:   11/14/13 1453  BP: 123/79  Pulse: 98  Temp: 96.8 F (36 C)  Resp: 18  Weight: 102 lb 6.4 oz (46.448 kg)  SpO2: 98%   Body mass index is 19.36 kg/(m^2).  General: She appears well-developed and well-nourished. No distress.  Alert, conversant Skin: Right supraorbital hematoma and right and left face/neck ecchymosis, improving. Nontender to light palpation. 2.3x1.6cm ulcer of the posterior right calf area. Tender with wound care. Distal periwound maceration. Mild  slough to wound bed, granulation tissue present. Hemosideran staining rt anterior LE. EYES: PERRL, visual fields intact, no EOMS.  periorbital yellow/green ecchymoses, R>L.  Nose: ecchymosis present to nose. No open wounds, edema MOUTH: Oropharynx is clear and moist. Cardiovascular: IRRR, rate controlled atrial fibrillation. No murmur, gallop, rub. No LE edema Respiratory:Brochial congestion clears with cough. All fields CTA GI: soft, nontender, +bowel sounds all quadrants Musculoskeletal: Normal range of motion. Mild spinal  kyphosis Neurological: alert,  Coordination normal. Short term Memory deficit. Hx dementia Psychiatric: mood and affect appropriate to situation    ASSESSMENT/PLAN  Atrial fibrillation Today IRRR, rate controlled. Continue current medications including eliquis for anticoagulation  Benign paroxysmal positional vertigo Denies any dizziness since Dix-Hallpike maneuvers performed x2 by Physical therapy. Her balance score has improved to 52. She is ambulating steadily with walker. She requires cues for safety and reminders to use walker at all times with ambulation. Plan to return to [place] apartment when home safety modifications are complete  Wound of right leg Rt posterior leg wound reportedly worse per nursing. Size increased to 2.3x1.6cm.  <25%slough, >granulation tissue, maceration at distal edge. Tender. No redness. Has been receiving dressing changes on Tues and Friday after bath. Changed wound care to cleanse with NS, apply no sting skin prep to periwound, apply small piece of calcium alginate to wound bed, cover with bordered hydrocolloid dressing. Will reassess Friday 78/10/15  Contusion of head Continue Arnica cream TID for facial and nuchal ecchymosis. Rt brow edema decreasing. Non tender to light palpation. No signs of worsening. Continue to monitor  Senile dementia, uncomplicated Nursing on rehab reports patient has oriented well to being on rehab. Increased confusion on arrival to unit likely due to change in environment, stress. Will follow for safety issues, as pt lives in assisted living apartment. Home modifications are being made. Pt is aware of her memory issues and is open to labeling item and posting reminders to maintain safety    Family/ staff Communication:  >30 minutes time spent counselling patient on safety and using assistive devices and in counselling and educations nursing in wound care procedures  Follow up: Friday 10/17/13  Barbara [name] Barbara Batts, NP-C/Barbara [name],  [place] [address] [phone]  11/14/2013

## 2013-11-17 ENCOUNTER — Encounter (HOSPITAL_COMMUNITY): Payer: Self-pay | Admitting: *Deleted

## 2013-11-17 ENCOUNTER — Encounter: Payer: Self-pay | Admitting: Adult Health

## 2013-11-17 ENCOUNTER — Non-Acute Institutional Stay: Payer: Medicare PPO | Admitting: Adult Health

## 2013-11-17 DIAGNOSIS — S81801A Unspecified open wound, right lower leg, initial encounter: Secondary | ICD-10-CM

## 2013-11-17 DIAGNOSIS — H8110 Benign paroxysmal vertigo, unspecified ear: Secondary | ICD-10-CM

## 2013-11-17 DIAGNOSIS — S1093XA Contusion of unspecified part of neck, initial encounter: Secondary | ICD-10-CM

## 2013-11-17 DIAGNOSIS — S0083XA Contusion of other part of head, initial encounter: Secondary | ICD-10-CM

## 2013-11-17 DIAGNOSIS — S0093XA Contusion of unspecified part of head, initial encounter: Secondary | ICD-10-CM

## 2013-11-17 DIAGNOSIS — S81009A Unspecified open wound, unspecified knee, initial encounter: Secondary | ICD-10-CM

## 2013-11-17 DIAGNOSIS — I872 Venous insufficiency (chronic) (peripheral): Secondary | ICD-10-CM | POA: Insufficient documentation

## 2013-11-17 DIAGNOSIS — S91009A Unspecified open wound, unspecified ankle, initial encounter: Secondary | ICD-10-CM

## 2013-11-17 DIAGNOSIS — S81809A Unspecified open wound, unspecified lower leg, initial encounter: Secondary | ICD-10-CM

## 2013-11-17 DIAGNOSIS — S0003XA Contusion of scalp, initial encounter: Secondary | ICD-10-CM

## 2013-11-17 NOTE — Assessment & Plan Note (Signed)
Contusion and associated facial/nuchal bruising are resolving nicely. Continue arnica TID

## 2013-11-17 NOTE — Progress Notes (Signed)
SDW assessment completed by [name], pt nurse at [facility]. Facility was given instructions for procedure by surgeons office; see charted documentation in pre-op instructions.

## 2013-11-17 NOTE — Assessment & Plan Note (Signed)
No complaints of dizziness. Pt also reports following safe ambulation and using walker as suggested. Will follow

## 2013-11-17 NOTE — Progress Notes (Signed)
Patient ID: Barbara [name], female   DOB: 15-Jun-1929, 80 y.o.   MRN: [id number]   [place] (13)   Code Status: DNR  Chief Complaint  Patient presents with  . Ulcer    HPI:  This is an 80 y.o. Female pt being seen today to follow up on wound care interventions initiated on Tuesday 11/14/13 for posterior rt LE ulcer. The dressing has remained intact with no apparent problems or complaints. She reports the wound came when she slipped on a curb and scraped it. It was just a scab and then opened when she has the fall in her room that resulted in the head contusion and her rehab stay. She was successfully treated by PT for BPPV.  The pt is back in her assisted living apartment from rehab.  She says she is getting around well, using her walker, and has not had any dizziness.      Allergies  Allergen Reactions  . Aricept [Donepezil Hcl]     Muscle pain     REVIEW OF SYSTEMS  DATA OBTAINED: from patient, nurse, medical record, caregiver, family member GENERAL: Feels well. Continuing to increase activity since returning to assisted living. No fevers or appetite changes  SKIN: wound has not bothered her. Dressing has stayed in place MUSCULOSKELETAL:   No muscle weakness    Gait is steady, is using walker . NEUROLOGIC: No dizziness. No change in mental status mild dementia   PHYSICAL EXAM Filed Vitals:   11/17/13 1304  BP: 94/62  Pulse: 78  Temp: 97 F (36.1 C)  Resp: 20  Weight: 102 lb 6.4 oz (46.448 kg)  SpO2: 95%   Body mass index is 19.36 kg/(m^2).  GENERAL APPEARANCE: No acute distress, appropriately groomed, normal body habitus Alert, pleasant, conversant. Sitting on her couch comfortably SKIN: Wound dressing intact with no excessive drainage. Removed to find wound larger, but with less slough present <10%. Wound bed visible with granulation tissue >25%. 3.0 x 2.8 x 0.2cm. periwound skin is dry. No redness, heat, odor. Tender upon cleansing.    HEAD: Normocephalic, atraumatic EYES: Conjunctiva/lids clear  RESPIRATORY: Breathing is even, unlabored  CARDIOVASCULAR: EDEMA: bilateral peripheral edema at lateral malleolous. Rt > Lt. Nonpitting, nontender. Bilateral anterior LE hemosideran staining. PSYCHIATRIC: Mood and affect appropriate to situation   ASSESSMENT/PLAN  Wound of right leg Rt posterior leg wound larger and cleaner on reassessment today. Size increased 3.0 x 2.8 x 0.2. Increased size/depth possibly due to debridement of slough. Increased size does not seem to be moisture related.  Minimal central slough <10%, which is good. Wound bed visible. I would have expected this wound to have responded with a decreased size with the previous treatment leading to question as to cause of the wound. Trauma vs Venous ulcer vs Skin cancer lesion. She has come lateral ankle edema as well. No redness or s/s infection.  Will change wound care orders to cleanse with NS, apply no sting prep to periwound, apply aquacel AG extra absorbant gauze to wound bed, cover with foam pad, wrap with coban. Dressing to remain in place until next Friday 4/17 and will reassess.  Nursing is to assess dressing/LE daily and notify if any decline noted.  Will consult WOCN if no improvement or decline next visit  Contusion of head Contusion and associated facial/nuchal bruising are resolving nicely. Continue arnica TID  Benign paroxysmal positional vertigo No complaints of dizziness. Pt also reports following safe ambulation and using walker as suggested. Will  follow  Unspecified venous (peripheral) insufficiency Pt has some mild Bilateral ankle edema. Rt>Lt. Non-pitting. Hemosideran staining evident on both LEs. Will provide venous support with compression wrap to RLEx1 week and then reassess. Pt may also elevate LEs for rest or increased edema.     Family/ staff Communication:  Nursing instructed to encourage patient elevate legs for rest/edema    Follow up:  Friday 11/24/13, or prn  Barbara [name].Barbara Burkel, NP-C/Barbara [name], [place] [phone]  11/17/2013

## 2013-11-17 NOTE — Assessment & Plan Note (Signed)
Pt has some mild Bilateral ankle edema. Rt>Lt. Non-pitting. Hemosideran staining evident on both LEs. Will provide venous support with compression wrap to RLEx1 week and then reassess. Pt may also elevate LEs for rest or increased edema.

## 2013-11-17 NOTE — Assessment & Plan Note (Signed)
Rt posterior leg wound larger and cleaner on reassessment today. Size increased 3.0 x 2.8 x 0.2. Increased size/depth possibly due to debridement of slough. Increased size does not seem to be moisture related.  Minimal central slough <10%, which is good. Wound bed visible. I would have expected this wound to have responded with a decreased size with the previous treatment leading to question as to cause of the wound. Trauma vs Venous ulcer vs Skin cancer lesion. She has come lateral ankle edema as well. No redness or s/s infection.  Will change wound care orders to cleanse with NS, apply no sting prep to periwound, apply aquacel AG extra absorbant gauze to wound bed, cover with foam pad, wrap with coban. Dressing to remain in place until next Friday 4/17 and will reassess.  Nursing is to assess dressing/LE daily and notify if any decline noted.  Will consult WOCN if no improvement or decline next visit

## 2013-11-21 ENCOUNTER — Encounter (HOSPITAL_COMMUNITY): Payer: Medicare PPO | Admitting: Certified Registered"

## 2013-11-21 ENCOUNTER — Ambulatory Visit (HOSPITAL_COMMUNITY)
Admission: RE | Admit: 2013-11-21 | Discharge: 2013-11-21 | Disposition: A | Discharge status: Home / Self Care | Payer: Medicare PPO | Source: Ambulatory Visit | Attending: Gastroenterology | Admitting: Gastroenterology

## 2013-11-21 ENCOUNTER — Ambulatory Visit (HOSPITAL_COMMUNITY): Payer: Medicare PPO | Admitting: Certified Registered"

## 2013-11-21 ENCOUNTER — Encounter (HOSPITAL_COMMUNITY)
Admission: RE | Disposition: A | Discharge status: Home / Self Care | Payer: Self-pay | Source: Ambulatory Visit | Attending: Gastroenterology

## 2013-11-21 ENCOUNTER — Encounter (HOSPITAL_COMMUNITY): Payer: Self-pay

## 2013-11-21 DIAGNOSIS — D126 Benign neoplasm of colon, unspecified: Secondary | ICD-10-CM | POA: Insufficient documentation

## 2013-11-21 DIAGNOSIS — M810 Age-related osteoporosis without current pathological fracture: Secondary | ICD-10-CM | POA: Insufficient documentation

## 2013-11-21 DIAGNOSIS — Z8673 Personal history of transient ischemic attack (TIA), and cerebral infarction without residual deficits: Secondary | ICD-10-CM | POA: Insufficient documentation

## 2013-11-21 DIAGNOSIS — Z87891 Personal history of nicotine dependence: Secondary | ICD-10-CM | POA: Insufficient documentation

## 2013-11-21 DIAGNOSIS — H04129 Dry eye syndrome of unspecified lacrimal gland: Secondary | ICD-10-CM | POA: Insufficient documentation

## 2013-11-21 DIAGNOSIS — D509 Iron deficiency anemia, unspecified: Secondary | ICD-10-CM | POA: Insufficient documentation

## 2013-11-21 DIAGNOSIS — Z7901 Long term (current) use of anticoagulants: Secondary | ICD-10-CM | POA: Insufficient documentation

## 2013-11-21 DIAGNOSIS — H409 Unspecified glaucoma: Secondary | ICD-10-CM | POA: Insufficient documentation

## 2013-11-21 DIAGNOSIS — R195 Other fecal abnormalities: Secondary | ICD-10-CM | POA: Insufficient documentation

## 2013-11-21 DIAGNOSIS — F079 Unspecified personality and behavioral disorder due to known physiological condition: Secondary | ICD-10-CM | POA: Insufficient documentation

## 2013-11-21 DIAGNOSIS — R5381 Other malaise: Secondary | ICD-10-CM | POA: Insufficient documentation

## 2013-11-21 DIAGNOSIS — F0390 Unspecified dementia without behavioral disturbance: Secondary | ICD-10-CM | POA: Insufficient documentation

## 2013-11-21 DIAGNOSIS — I872 Venous insufficiency (chronic) (peripheral): Secondary | ICD-10-CM | POA: Insufficient documentation

## 2013-11-21 DIAGNOSIS — E559 Vitamin D deficiency, unspecified: Secondary | ICD-10-CM | POA: Insufficient documentation

## 2013-11-21 DIAGNOSIS — H3530 Unspecified macular degeneration: Secondary | ICD-10-CM | POA: Insufficient documentation

## 2013-11-21 DIAGNOSIS — K5730 Diverticulosis of large intestine without perforation or abscess without bleeding: Secondary | ICD-10-CM | POA: Insufficient documentation

## 2013-11-21 DIAGNOSIS — K449 Diaphragmatic hernia without obstruction or gangrene: Secondary | ICD-10-CM | POA: Insufficient documentation

## 2013-11-21 DIAGNOSIS — E039 Hypothyroidism, unspecified: Secondary | ICD-10-CM | POA: Insufficient documentation

## 2013-11-21 DIAGNOSIS — E785 Hyperlipidemia, unspecified: Secondary | ICD-10-CM | POA: Insufficient documentation

## 2013-11-21 DIAGNOSIS — F411 Generalized anxiety disorder: Secondary | ICD-10-CM | POA: Insufficient documentation

## 2013-11-21 DIAGNOSIS — H8110 Benign paroxysmal vertigo, unspecified ear: Secondary | ICD-10-CM | POA: Insufficient documentation

## 2013-11-21 DIAGNOSIS — M1990 Unspecified osteoarthritis, unspecified site: Secondary | ICD-10-CM | POA: Insufficient documentation

## 2013-11-21 HISTORY — PX: ESOPHAGOGASTRODUODENOSCOPY (EGD) WITH PROPOFOL: SHX5813

## 2013-11-21 HISTORY — PX: COLONOSCOPY WITH PROPOFOL: SHX5780

## 2013-11-21 SURGERY — ESOPHAGOGASTRODUODENOSCOPY (EGD) WITH PROPOFOL
Anesthesia: Monitor Anesthesia Care

## 2013-11-21 MED ORDER — SODIUM CHLORIDE 0.9 % IV SOLN: INTRAVENOUS | Status: DC

## 2013-11-21 MED ORDER — PROPOFOL INFUSION 10 MG/ML OPTIME
INTRAVENOUS | Status: DC | PRN
  Administered 2013-11-21: 100 ug/kg/min via INTRAVENOUS

## 2013-11-21 MED ORDER — PHENYLEPHRINE HCL 10 MG/ML IJ SOLN
INTRAMUSCULAR | Status: DC | PRN
  Administered 2013-11-21: 80 ug via INTRAVENOUS

## 2013-11-21 MED ORDER — LACTATED RINGERS IV SOLN
INTRAVENOUS | Status: DC | PRN
  Administered 2013-11-21: 10:00:00 via INTRAVENOUS

## 2013-11-21 MED ORDER — BUTAMBEN-TETRACAINE-BENZOCAINE 2-2-14 % EX AERO
INHALATION_SPRAY | CUTANEOUS | Status: DC | PRN
  Administered 2013-11-21: 2 via TOPICAL

## 2013-11-21 NOTE — Anesthesia Preprocedure Evaluation (Signed)
Anesthesia Evaluation  Patient identified by MRN, date of birth, ID band Patient awake    Reviewed: Allergy & Precautions, H&P , NPO status , Patient's Chart, lab work & pertinent test results  Airway Mallampati: II      Dental   Pulmonary former smoker,  breath sounds clear to auscultation        Cardiovascular + Peripheral Vascular Disease Rhythm:Regular Rate:Normal     Neuro/Psych    GI/Hepatic negative GI ROS, Neg liver ROS,   Endo/Other  Hypothyroidism   Renal/GU      Musculoskeletal   Abdominal   Peds  Hematology  (+) anemia ,   Anesthesia Other Findings   Reproductive/Obstetrics                           Anesthesia Physical Anesthesia Plan  ASA: III  Anesthesia Plan: MAC   Post-op Pain Management:    Induction: Intravenous  Airway Management Planned: Nasal Cannula  Additional Equipment:   Intra-op Plan:   Post-operative Plan:   Informed Consent: I have reviewed the patients History and Physical, chart, labs and discussed the procedure including the risks, benefits and alternatives for the proposed anesthesia with the patient or authorized representative who has indicated his/her understanding and acceptance.   Dental advisory given  Plan Discussed with: CRNA and Anesthesiologist  Anesthesia Plan Comments:         Anesthesia Quick Evaluation

## 2013-11-21 NOTE — Op Note (Signed)
[facility] [place], [id number]   ENDOSCOPY PROCEDURE REPORT  PATIENT: [name], Barbara  MR#: [id number] BIRTHDATE: 01-Jan-1929 , 84  yrs. old GENDER: Female ENDOSCOPIST:Kamryn Messineo, MD REFERRED BY:  Dr. Elder Negus PROCEDURE DATE:  11/21/2013 PROCEDURE:      upper endoscopy ASA CLASS: INDICATIONS:   intermittent heme positivity and iron deficiency anemia in a patient on chronic anticoagulation with Eloquis MEDICATION:    MAC per anesthesia TOPICAL ANESTHETIC:    Cetacaine spray  DESCRIPTION OF PROCEDURE:   the patient came as an outpatient to the [facility] cone endoscopy unit. The nature, purpose, and risks of the procedure had been discussed with the patient's daughter, who provided written consent on her mothers behalf since the patient has organic brain syndrome. The patient received MAC sedation and remained stable throughout the procedure. Prior to passage of the endoscope, time out was performed.  The Pentax adult video endoscope was passed under direct vision. The larynx looked normal. The esophagus was entered without significant difficulty under direct vision, and was normal in its entirety, specifically without evidence of reflux esophagitis, Barrett's esophagus, varices, neoplasia, esophageal ring, or infection. A 2 cm hiatal hernia was present.  The stomach was entered. It contained a small clear residual. The gastric mucosa was normal, without evidence of gastritis, erosions, ulcers, polyps, or masses. A retroflex view the cardia was normal.  The pylorus, duodenal bulb, and second duodenum looked normal.  The scope was removed from the patient tolerated the procedure well.      COMPLICATIONS: None  ENDOSCOPIC IMPRESSION:  1. Small hiatal hernia, otherwise normal endoscopy, without source of heme positivity or anemia endoscopically evident.  RECOMMENDATIONS:  Proceed to colonoscopic  evaluation.    _______________________________ Lorrin MaisRonald Lobo, MD 11/21/2013 10:56 AM    PATIENT NAME:  Barbara [name], Barbara [name] MR#: [id number]

## 2013-11-21 NOTE — Anesthesia Procedure Notes (Signed)
Procedure Name: MAC Date/Time: 11/21/2013 9:50 AM Performed by: Julian Reil Pre-anesthesia Checklist: Patient identified, Emergency Drugs available, Suction available and Patient being monitored Patient Re-evaluated:Patient Re-evaluated prior to inductionOxygen Delivery Method: Nasal cannula Placement Confirmation: positive ETCO2 and breath sounds checked- equal and bilateral

## 2013-11-21 NOTE — Transfer of Care (Signed)
Immediate Anesthesia Transfer of Care Note  Patient: Barbara [name]  Procedure(s) Performed: Procedure(s): ESOPHAGOGASTRODUODENOSCOPY (EGD) WITH PROPOFOL (N/A) COLONOSCOPY WITH PROPOFOL (N/A)  Patient Location: PACU  Anesthesia Type:MAC  Level of Consciousness: awake, alert , oriented and patient cooperative  Airway & Oxygen Therapy: Patient Spontanous Breathing and Patient connected to nasal cannula oxygen  Post-op Assessment: Report given to PACU RN and Post -op Vital signs reviewed and stable  Post vital signs: Reviewed and stable  Complications: No apparent anesthesia complications

## 2013-11-21 NOTE — H&P (Signed)
Barbara [name] is an 80 y.o. female.   Chief Complaint: Heme positive stool and anemia HPI: Elderly nursing home resident with iron deficiency anemia requiring transfusion, last colonoscoped 10 years ago at which time a medium-sized adenoma was removed from the transverse colon. She is on Eloquis as a blood thinner because of a prior history of a CVA. When most recently checked in my office about 2 weeks ago, she was actually Hemoccult-negative at that time.  Past Medical History  Diagnosis Date  . Atrial fibrillation 2009    Long term anticoagulation. Warfarin changed to Xarelto 06/2012  . Cystocele, midline   . Senile dementia, uncomplicated   . Generalized anxiety disorder   . Unspecified glaucoma   . Macular degeneration (senile) of retina, unspecified   . Osteoarthrosis, unspecified whether generalized or localized, unspecified site   . Senile osteoporosis   . Pneumonia, organism unspecified 03/2012    Tx w/ PO antibx  . Unspecified vitamin D deficiency   . Unspecified venous (peripheral) insufficiency   . Tear film insufficiency, unspecified 08/2012  . Debility, unspecified 04/01/2012  . Long term (current) use of anticoagulants 04/01/2012  . CVA (cerebral infarction) 11/19/2012  . Anemia 11/2012    transfusion 11/2012  . Unspecified hypothyroidism   . Other and unspecified hyperlipidemia     Atorvastatin started 12/2012  . Benign paroxysmal positional vertigo 11/08/2013  . Glaucoma   . Stroke 2014    Past Surgical History  Procedure Laterality Date  . Tonsillectomy and adenoidectomy    . Umbilical hernia repair    . Cataract extraction w/ intraocular lens  implant, bilateral    . Spine surgery      RE; Spinal stenosis    Family History  Problem Relation Age of Onset  . Heart attack Father    Social History:  reports that she quit smoking about 32 years ago. Her smoking use included Cigarettes. She smoked 0.00 packs per day. She has never used smokeless tobacco. She  reports that she does not drink alcohol or use illicit drugs.  Allergies:  Allergies  Allergen Reactions  . Aricept [Donepezil Hcl]     Muscle pain    Medications Prior to Admission  Medication Sig Dispense Refill  . acetaminophen (TYLENOL) 325 MG tablet Take 650 mg by mouth 2 (two) times daily.      Marland Kitchen atorvastatin (LIPITOR) 10 MG tablet Take 10 mg by mouth daily.      . Calcium Carbonate-Vitamin D (CALTRATE 600+D) 600-400 MG-UNIT per chew tablet Chew 1 tablet by mouth daily.      . carboxymethylcellulose (REFRESH PLUS) 0.5 % SOLN Place 1 drop into both eyes every 4 (four) hours as needed.       . cholecalciferol (VITAMIN D) 1000 UNITS tablet Take 1,000 Units by mouth daily.      Marland Kitchen diltiazem (TIAZAC) 360 MG 24 hr capsule Take 360 mg by mouth daily.      . dorzolamide-timolol (COSOPT) 22.3-6.8 MG/ML ophthalmic solution Place 1 drop into both eyes 2 (two) times daily.      Marland Kitchen ELIQUIS 2.5 MG TABS tablet Take 2.5 mg by mouth 2 (two) times daily.       Marland Kitchen levothyroxine (SYNTHROID, LEVOTHROID) 75 MCG tablet Take 75 mcg by mouth daily before breakfast.      . Menthol, Topical Analgesic, (BENGAY COLD THERAPY) 5 % GEL Apply 1 application topically 4 (four) times daily. Apply to lower back and/or bilateral hips with pain up to 4 times  daily.      . Multiple Vitamins-Minerals (PRESERVISION/LUTEIN) CAPS Take 2 capsules by mouth daily.      . polyethylene glycol (MIRALAX / GLYCOLAX) packet Take 17 g by mouth daily. Mix with 6oz beverage of choice. Hold for loose stool      . sennosides-docusate sodium (SENOKOT-S) 8.6-50 MG tablet Take 1 tablet by mouth at bedtime as needed for constipation.       . sertraline (ZOLOFT) 50 MG tablet Take 50 mg by mouth at bedtime.      . Teriparatide, Recombinant, (FORTEO) 600 MCG/2.4ML SOLN Inject 20 mcg into the skin every morning.      . TRAVATAN Z 0.004 % SOLN ophthalmic solution Place 1 drop into both eyes daily.         No results found for this or any previous  visit (from the past 48 hour(s)). No results found.  ROS  Blood pressure 127/77, pulse 77, temperature 97.6 F (36.4 C), temperature source Oral, resp. rate 14, SpO2 98.00%. Physical Exam   The patient has facial ecchymoses associated with a recent fall. She is pleasant and conversant despite her dementia. Chest clear, heart without murmur, no obvious arrhythmia at the moment. Rate is well controlled. Abdomen is without guarding, mass effect, or tenderness  Assessment/Plan Iron deficiency anemia and recent need for transfusions, with intermittent heme positivity when checked at the nursing home. Prior history of colonic polyp.  Plan: Proceed to upper endoscopy and colonoscopy today. I have reviewed the nature, purpose, and risks of these procedures with patient's daughter who has provided consent on the patient's behalf.   Barbara [name] 11/21/2013, 9:59 AM

## 2013-11-21 NOTE — Op Note (Signed)
[facility] [place], [id number]   COLONOSCOPY PROCEDURE REPORT  PATIENT: Barbara [name], Barbara [name]  MR#: [id number] BIRTHDATE: [birth year] , 84  yrs. old GENDER: Female ENDOSCOPIST: Ronald Lobo, MD REFERRED BY:   Dr. Elder Negus PROCEDURE DATE:  11/21/2013 PROCEDURE:     colonoscopy with biopsies ASA CLASS: INDICATIONS:  recurrently Hemoccult positive stool and iron deficiency anemia in a patient on chronic anticoagulation with Eloquis and a remote history of a transverse colonic "hyperplastic" 1.5 cm polyp (? Serrated adenoma in 2 days later) removed 10 years ago MEDICATIONS:    MAC, per anesthesia  DESCRIPTION OF PROCEDURE: this procedure was performed immediately following the patient's upper endoscopy. She had come as an outpatient to the [facility] cone endoscopy unit. Prep was TriLyte. She was stable from the anesthesia perspective throughout the procedure. Time out had already been performed prior to her endoscopy.  The Pentax adult video colonoscope was advanced around the colon with mild difficulty due to restricted mobility of the sigmoid colon with associated diverticular disease, and with some looping, necessitating external abdominal compression to reach the base of the cecum, which was identified by visualization of the appendiceal orifice. Pullback was then performed. The quality of the prep was very good it is felt that essentially all areas were well seen.  Fairly prolonged efforts to enter the terminal ileum were unsuccessful, partly because of a somewhat eccentric orientation of the ileal cecal valve orifice.  In the transverse colon, there was a tattoo corresponding to the patient's previous polypectomy site, and near this, was a 3 cm x 2 cm fairly flat, pale, benign-appearing polyp. Because of this patient's need for ongoing thorough anticoagulation (she had had a stroke in the past when she had been taken off her blood  thinner for several weeks), I did not feel that it would be safe to remove this lesion by snare polypectomy with cautery. Therefore, and taking into consideration the patient's advanced age and the benign appearance of this polyp, I felt it was most prudent simply to biopsy it to make sure it did not have high-grade dysplasia. Multiple biopsies were obtained from various areas of the polyp.  The patient had scattered pancolonic diverticulosis.  No other significant polyps were seen, nor was there evidence of colitis or vascular ectasia.  On the way out, there were some areas of linear bands of erythema at flexion points consistent with scope trauma, but there was no evidence of laceration or perforation.     COMPLICATIONS: None  ENDOSCOPIC IMPRESSION:  1. No source of heme positive stool or anemia evident on this examination.  2. Medium-sized sessile benign-appearing polyp in the transverse colon, near the site of the patient's polypectomy from 10 years ago. Multiple biopsies obtained but definitive removal not attempted because of bleeding risk (see above discussion).  3. Pancolonic scattered diverticulosis.  RECOMMENDATIONS:  1. Await pathology on polyp. In the unlikely event if high-grade dysplasia being present, consider either surgical removal or repeat endoscopy with further biopsies in 1-2 years. Otherwise, taking into account this patient's advanced age and overall medical condition, further surveillance would probably not be necessary.  2. Arrange capsule endoscopy as an outpatient to check the small bowel for a source of the patient's ongoing issues with heme positivity and anemia, since neither her endoscopy today nor the current colonoscopy gives an explanation for those problems.    _______________________________ Barbara MaisRonald Lobo, MD 11/21/2013 11:09 AM     PATIENT  NAME:  Barbara [name], Barbara [name] MR#: [id number]

## 2013-11-22 ENCOUNTER — Encounter (HOSPITAL_COMMUNITY): Payer: Self-pay | Admitting: Gastroenterology

## 2013-11-24 ENCOUNTER — Non-Acute Institutional Stay: Payer: Medicare PPO | Admitting: Adult Health

## 2013-11-24 ENCOUNTER — Encounter: Payer: Self-pay | Admitting: Adult Health

## 2013-11-24 DIAGNOSIS — Z7901 Long term (current) use of anticoagulants: Secondary | ICD-10-CM

## 2013-11-24 DIAGNOSIS — S81809A Unspecified open wound, unspecified lower leg, initial encounter: Secondary | ICD-10-CM

## 2013-11-24 DIAGNOSIS — I872 Venous insufficiency (chronic) (peripheral): Secondary | ICD-10-CM

## 2013-11-24 DIAGNOSIS — D649 Anemia, unspecified: Secondary | ICD-10-CM

## 2013-11-24 DIAGNOSIS — I4891 Unspecified atrial fibrillation: Secondary | ICD-10-CM

## 2013-11-24 DIAGNOSIS — S81009A Unspecified open wound, unspecified knee, initial encounter: Secondary | ICD-10-CM

## 2013-11-24 DIAGNOSIS — S81801A Unspecified open wound, right lower leg, initial encounter: Secondary | ICD-10-CM

## 2013-11-24 DIAGNOSIS — S91009A Unspecified open wound, unspecified ankle, initial encounter: Secondary | ICD-10-CM

## 2013-11-24 NOTE — Assessment & Plan Note (Signed)
Colonoscopy/endoscopy without source of bleeding. Dr. [name] recommends capsule endoscopy to look at small bowel if anemia persists. Will update cbc today.

## 2013-11-24 NOTE — Assessment & Plan Note (Signed)
Pt posterior LE wound significantly better today. 0.9 X 0.7 x 0.1cm. Clean, non-granulating pink tissue in wound bed, very close to surface. No excessive moisture/drainage. No s/s infection. I suspect that this may have been a traumatic wound that was exacerbated by her chronic venous insufficiency. Using compression wrap/aquacel ag, and foam was successful intervention. Will d/c aquacel ag due to no excessive drainage. Clean with NS, apply vaseline to periwound and heel and LE, mepitel to wound bed, cover with foam. Wrap with coban. Reassess next Thursday 11/30/13

## 2013-11-24 NOTE — Anesthesia Postprocedure Evaluation (Signed)
  Anesthesia Post-op Note  Patient: Barbara [name]  Procedure(s) Performed: Procedure(s): ESOPHAGOGASTRODUODENOSCOPY (EGD) WITH PROPOFOL (N/A) COLONOSCOPY WITH PROPOFOL (N/A)  Patient Location: PACU and Endoscopy Unit  Anesthesia Type:MAC  Level of Consciousness: awake  Airway and Oxygen Therapy: Patient Spontanous Breathing  Post-op Pain: mild  Post-op Assessment: Post-op Vital signs reviewed  Post-op Vital Signs: Reviewed  Last Vitals:  Filed Vitals:   11/21/13 1125  BP: 114/69  Pulse: 65  Temp:   Resp: 18    Complications: No apparent anesthesia complications

## 2013-11-24 NOTE — Assessment & Plan Note (Signed)
Edema to right lower extremity resolved with compression therapy. Will wrap for an additional week and reassess. Continue to elevate Bilateral LE for edema, rest

## 2013-11-24 NOTE — Assessment & Plan Note (Signed)
Continue eliquis 2.5mg  BID for now. CBC today. If persistent/worsening anemia, may need to consider other anticoagulation options

## 2013-11-24 NOTE — Assessment & Plan Note (Signed)
Today IRRR, rate controlled. Continue current medications, including eliquis 2.5mg  BID for anticoagulation. Update CBC, BMP.

## 2013-11-24 NOTE — Progress Notes (Signed)
Patient ID: Barbara [name], female   DOB: 06/10/1929, 81 y.o.   MRN: [id number]   [place] (13)  Code Status: DNR    Chief Complaint  Patient presents with  . Open Wound  . Anemia  . Venous Insufficiency  . Atrial Fibrillation    HPI: This is an 80 y.o. Female pt living on assisted living at [facility]. We are seeing her today to follow up wound care interventions of rt LE wound initiated last Friday 11/17/13. We will also review her colonoscopy notes from Dr. [name] for any insight into anemia. She has continued to improve since returning home from rehab. Staff reports no complaints of dizziness or other problems. Edema reported improved to bilateral LE  Last visit 11/17/13 ASSESSMENT/PLAN  Wound of right leg  Rt posterior leg wound larger and cleaner on reassessment today. Size increased 3.0 x 2.8 x 0.2. Increased size/depth possibly due to debridement of slough. Increased size does not seem to be moisture related. Minimal central slough <10%, which is good. Wound bed visible. I would have expected this wound to have responded with a decreased size with the previous treatment leading to question as to cause of the wound. Trauma vs Venous ulcer vs Skin cancer lesion. She has come lateral ankle edema as well. No redness or s/s infection. Will change wound care orders to cleanse with NS, apply no sting prep to periwound, apply aquacel AG extra absorbant gauze to wound bed, cover with foam pad, wrap with coban. Dressing to remain in place until next Friday 4/17 and will reassess. Nursing is to assess dressing/LE daily and notify if any decline noted. Will consult WOCN if no improvement or decline next visit  Contusion of head  Contusion and associated facial/nuchal bruising are resolving nicely. Continue arnica TID  Benign paroxysmal positional vertigo  No complaints of dizziness. Pt also reports following safe ambulation and using walker as suggested. Will follow   Unspecified venous (peripheral) insufficiency  Pt has some mild Bilateral ankle edema. Rt>Lt. Non-pitting. Hemosideran staining evident on both LEs. Will provide venous support with compression wrap to RLEx1 week and then reassess. Pt may also elevate LEs for rest or increased edema.       Allergies  Allergen Reactions  . Aricept [Donepezil Hcl]     Muscle pain   MEDICATIONS -  Reviewed. No changes  DATA REVIEWED  Endoscopy/Colonoscopy 11/22/13- no evidence of bleeding source. Polyp removed, benign pathology   REVIEW OF SYSTEMS  DATA OBTAINED: from patient, nurse, medical record, caregiver, family member  GENERAL: Feels well. Continuing to increase activity since returning to assisted living. No fevers or appetite changes  CARDIOVASCULAR: no chest pain, palpitations, edema RESP: no SOB, DOE, cough, wheeze GI: reports anus is a bit tender from colonoscopy prep procedures, but is improving. She has vaseline to use if she needs it. She is eating and drinking well following procedure. Denies bleeding SKIN: wound/dressing has not bothered her. Dressing has stayed in place.  She is hoping wound is healed so she can take a shower MUSCULOSKELETAL: No muscle weakness Gait is steady, has been using walker .  NEUROLOGIC: No dizziness. No change in mental status mild dementia    PHYSICAL EXAM Filed Vitals:   11/24/13 1038  BP: 137/82  Pulse: 79  Temp: 97.6 F (36.4 C)  Resp: 20  Weight: 102 lb 6.4 oz (46.448 kg)  SpO2: 94%   Body mass index is 19.36 kg/(m^2).  GENERAL APPEARANCE: No acute distress,  appropriately groomed, normal body habitus Alert, pleasant, conversant. Ambulating to couch from bedroom.  SKIN: Wound dressing intact with no excessive drainage. Removed to find wound significantly smaller 0.9 x 0.7 x 0.1cm. Clean, pink non-granulating wound bed, very close to surface. No pain, no excessive drainage. So sxs infection. Facial and nuchal ecchymosis continuing to  diminish HEAD: Normocephalic, atraumatic.   EYES: Conjunctiva/lids clear  RESPIRATORY: Breathing is even, unlabored, CTA all fields  CARDIOVASCULAR: IRRR, rate controlled, afib  EDEMA: No current peripheral edema. Bilateral anterior LE hemosideran staining.  GI: soft, non tender, positive normoactive bowel sounds 4 quadrants MSK: steady gait with walker PSYCHIATRIC: Mood and affect appropriate to situation    ASSESSMENT/PLAN  Atrial fibrillation Today IRRR, rate controlled. Continue current medications, including eliquis 2.5mg  BID for anticoagulation. Update CBC, BMP.   Anemia Colonoscopy/endoscopy without source of bleeding. Dr. [name] recommends capsule endoscopy to look at small bowel if anemia persists. Will update cbc today.  Long term (current) use of anticoagulants Continue eliquis 2.5mg  BID for now. CBC today. If persistent/worsening anemia, may need to consider other anticoagulation options  Unspecified venous (peripheral) insufficiency Edema to right lower extremity resolved with compression therapy. Will wrap for an additional week and reassess. Continue to elevate Bilateral LE for edema, rest  Wound of right leg Pt posterior LE wound significantly better today. 0.9 X 0.7 x 0.1cm. Clean, non-granulating pink tissue in wound bed, very close to surface. No excessive moisture/drainage. No s/s infection. I suspect that this may have been a traumatic wound that was exacerbated by her chronic venous insufficiency. Using compression wrap/aquacel ag, and foam was successful intervention. Will d/c aquacel ag due to no excessive drainage. Clean with NS, apply vaseline to periwound and heel and LE, mepitel to wound bed, cover with foam. Wrap with coban. Reassess next Thursday 11/30/13   Family/ staff Communication: Leave dressing in place and assess outer wrap daily. Pt may elevate legs for edema, rest   Labs/tests ordered: CBC/BMP   Follow up: Return for As scheduled.  Clancy Mullarkey  T.Elmina Hendel, NP-C/[employer] student [employer] [phone]  11/24/2013

## 2013-11-27 ENCOUNTER — Non-Acute Institutional Stay: Payer: Medicare PPO | Admitting: Internal Medicine

## 2013-11-27 ENCOUNTER — Encounter: Payer: Self-pay | Admitting: Internal Medicine

## 2013-11-27 VITALS — BP 108/62 | HR 68 | Wt 107.0 lb

## 2013-11-27 DIAGNOSIS — D649 Anemia, unspecified: Secondary | ICD-10-CM

## 2013-11-27 DIAGNOSIS — S81801A Unspecified open wound, right lower leg, initial encounter: Secondary | ICD-10-CM

## 2013-11-27 DIAGNOSIS — S81009A Unspecified open wound, unspecified knee, initial encounter: Secondary | ICD-10-CM

## 2013-11-27 DIAGNOSIS — S91009A Unspecified open wound, unspecified ankle, initial encounter: Secondary | ICD-10-CM

## 2013-11-27 DIAGNOSIS — Z7901 Long term (current) use of anticoagulants: Secondary | ICD-10-CM

## 2013-11-27 DIAGNOSIS — F0390 Unspecified dementia without behavioral disturbance: Secondary | ICD-10-CM

## 2013-11-27 DIAGNOSIS — H8110 Benign paroxysmal vertigo, unspecified ear: Secondary | ICD-10-CM

## 2013-11-27 DIAGNOSIS — S81809A Unspecified open wound, unspecified lower leg, initial encounter: Secondary | ICD-10-CM

## 2013-11-27 DIAGNOSIS — Z9181 History of falling: Secondary | ICD-10-CM

## 2013-11-27 DIAGNOSIS — I4891 Unspecified atrial fibrillation: Secondary | ICD-10-CM

## 2013-11-27 DIAGNOSIS — S20219A Contusion of unspecified front wall of thorax, initial encounter: Secondary | ICD-10-CM | POA: Insufficient documentation

## 2013-11-27 HISTORY — DX: History of falling: Z91.81

## 2013-11-27 HISTORY — DX: Contusion of unspecified front wall of thorax, initial encounter: S20.219A

## 2013-11-27 NOTE — Progress Notes (Signed)
Patient ID: Barbara [name], female   DOB: 1929/08/02, 80 y.o.   MRN: [id number]    Location:  [facility], room 516  Place of Service: CLINIC    Allergies  Allergen Reactions  . Aricept [Donepezil Hcl]     Muscle pain    Chief Complaint  Patient presents with  . Medical Management of Chronic Issues    A-Fib, anemia, thyroid, anemia, wound right leg. With daughter Barbara [name]  . Fall    rolled off sofa yesterday left rib area hurts when she coughs    HPI:  History of fall: fell off couch. Hit left chest. Hurts to cough.  Atrial fibrillation: chronic. Rate controlled  Senile dementia, uncomplicated: unchanged  Benign paroxysmal positional vertigo: episode of dizziness last week. Better now.  Wound of right leg: since mid march she has had a slowly healing area of the right posterior calf area.l Currently on Mepitel, sponge and Coban dressing. Observed with Barbara Loser, NP today. She believes it is improved in the last week.  Anemia: recent hgb have been normal or nearly so. Has seen Dr. Cristina [name]. On 4/15 15, she had EGD( [facility]) and Colonoscopy ( one polyp and pancolonic diverticulosis. No active bleeding. She is scheduled for capsule endoscopy.   Long term (current) use of anticoagulants: using Eliquis for AF.    Medications: Patient's Medications  New Prescriptions   No medications on file  Previous Medications   ACETAMINOPHEN (TYLENOL) 325 MG TABLET    Take 650 mg by mouth 2 (two) times daily.   ATORVASTATIN (LIPITOR) 10 MG TABLET    Take 10 mg by mouth daily.   CALCIUM CARBONATE-VITAMIN D (CALTRATE 600+D) 600-400 MG-UNIT PER CHEW TABLET    Chew 1 tablet by mouth daily.   CARBOXYMETHYLCELLULOSE (REFRESH PLUS) 0.5 % SOLN    Place 1 drop into both eyes every 4 (four) hours as needed.    CHOLECALCIFEROL (VITAMIN D) 1000 UNITS TABLET    Take 1,000 Units by mouth daily.   DILTIAZEM (TIAZAC) 360 MG 24 HR CAPSULE    Take 360 mg by mouth daily.   DORZOLAMIDE-TIMOLOL (COSOPT) 22.3-6.8 MG/ML  OPHTHALMIC SOLUTION    Place 1 drop into both eyes 2 (two) times daily.   ELIQUIS 2.5 MG TABS TABLET    Take 2.5 mg by mouth 2 (two) times daily.    LEVOTHYROXINE (SYNTHROID, LEVOTHROID) 75 MCG TABLET    Take 75 mcg by mouth daily before breakfast.   MENTHOL, TOPICAL ANALGESIC, (BENGAY COLD THERAPY) 5 % GEL    Apply 1 application topically 4 (four) times daily. Apply to lower back and/or bilateral hips with pain up to 4 times daily.   MULTIPLE VITAMINS-MINERALS (PRESERVISION/LUTEIN) CAPS    Take 2 capsules by mouth daily.   POLYETHYLENE GLYCOL (MIRALAX / GLYCOLAX) PACKET    Take 17 g by mouth daily. Mix with 6oz beverage of choice. Hold for loose stool   SENNOSIDES-DOCUSATE SODIUM (SENOKOT-S) 8.6-50 MG TABLET    Take 1 tablet by mouth at bedtime as needed for constipation.    SERTRALINE (ZOLOFT) 50 MG TABLET    Take 50 mg by mouth at bedtime.   TERIPARATIDE, RECOMBINANT, (FORTEO) 600 MCG/2.4ML SOLN    Inject 20 mcg into the skin every morning.   TRAVATAN Z 0.004 % SOLN OPHTHALMIC SOLUTION    Place 1 drop into both eyes daily.   Modified Medications   No medications on file  Discontinued Medications   No medications on file     Review  of Systems  Constitutional: Positive for activity change. Negative for fever, chills, diaphoresis, appetite change and fatigue.  HENT: Positive for hearing loss.        Right facial weakness  Eyes:       Wears prescription lenses  Respiratory: Negative for cough, choking, chest tightness, shortness of breath, wheezing and stridor.        Tender left chest wall  Cardiovascular: Negative for chest pain, palpitations and leg swelling.        History chronic atrial fibrillation  Gastrointestinal: Negative for abdominal pain and abdominal distention.  Endocrine: Negative.   Genitourinary:       Urgency and incontinence  Musculoskeletal:       Right-sided weakness.  Skin:       Slowly healing would of the right calf.  Allergic/Immunologic: Negative.     Neurological: Positive for dizziness.       History of right facial weakness and right hemiparesis. Dementia.  Hematological:       Hx anemia requiring transfusion. No gross GI bleeding was noted. Hgb nl Aug 2014.  Psychiatric/Behavioral:       Chronic dementia    Filed Vitals:   11/27/13 1346  BP: 108/62  Pulse: 68  Weight: 107 lb (48.535 kg)   Physical Exam  Constitutional: She appears well-nourished. No distress.  Frail elderly female  HENT:  Head: Normocephalic and atraumatic.  Right Ear: External ear normal.  Nose: Nose normal.  Eyes: EOM are normal. Pupils are equal, round, and reactive to light.  Neck: Normal range of motion. Neck supple. No JVD present. No tracheal deviation present. No thyromegaly present.  Cardiovascular: Normal rate and regular rhythm.  Exam reveals no gallop and no friction rub.   Murmur heard. HxAtrial fibrillation; currently in NSR  Pulmonary/Chest: Effort normal and breath sounds normal. No respiratory distress. She has no wheezes. She has no rales. She exhibits tenderness (eft chest wall).  Abdominal: Soft. Bowel sounds are normal. She exhibits no distension and no mass. There is no tenderness.  Musculoskeletal: She exhibits no edema and no tenderness.  Weakness of the right arm when sheattempts to extend it. Weak in the right leg when she attempts to raise against gravity.  Lymphadenopathy:    She has no cervical adenopathy.  Neurological: No cranial nerve deficit. Coordination normal.  Loss of memory  Skin: Skin is warm and dry. No rash noted. She is not diaphoretic. No erythema. No pallor.  Slowly healing wound of the right mid calf. No signs of infection.  Psychiatric: She has a normal mood and affect. Her behavior is normal.     Labs reviewed: Admission on 11/03/2013, Discharged on 11/03/2013  Component Date Value Ref Range Status  . WBC 11/03/2013 8.5  4.0 - 10.5 K/uL Final  . RBC 11/03/2013 3.77* 3.87 - 5.11 MIL/uL Final  .  Hemoglobin 11/03/2013 12.4  12.0 - 15.0 g/dL Final  . HCT 11/03/2013 36.4  36.0 - 46.0 % Final  . MCV 11/03/2013 96.6  78.0 - 100.0 fL Final  . MCH 11/03/2013 32.9  26.0 - 34.0 pg Final  . MCHC 11/03/2013 34.1  30.0 - 36.0 g/dL Final  . RDW 11/03/2013 13.2  11.5 - 15.5 % Final  . Platelets 11/03/2013 280  150 - 400 K/uL Final  . Neutrophils Relative % 11/03/2013 65  43 - 77 % Final  . Neutro Abs 11/03/2013 5.5  1.7 - 7.7 K/uL Final  . Lymphocytes Relative 11/03/2013 20  12 - 46 %  Final  . Lymphs Abs 11/03/2013 1.7  0.7 - 4.0 K/uL Final  . Monocytes Relative 11/03/2013 13* 3 - 12 % Final  . Monocytes Absolute 11/03/2013 1.1* 0.1 - 1.0 K/uL Final  . Eosinophils Relative 11/03/2013 2  0 - 5 % Final  . Eosinophils Absolute 11/03/2013 0.2  0.0 - 0.7 K/uL Final  . Basophils Relative 11/03/2013 0  0 - 1 % Final  . Basophils Absolute 11/03/2013 0.0  0.0 - 0.1 K/uL Final  . Sodium 11/03/2013 138  137 - 147 mEq/L Final  . Potassium 11/03/2013 4.4  3.7 - 5.3 mEq/L Final  . Chloride 11/03/2013 103  96 - 112 mEq/L Final  . CO2 11/03/2013 21  19 - 32 mEq/L Final  . Glucose, Bld 11/03/2013 87  70 - 99 mg/dL Final  . BUN 11/03/2013 17  6 - 23 mg/dL Final  . Creatinine, Ser 11/03/2013 0.70  0.50 - 1.10 mg/dL Final  . Calcium 11/03/2013 9.1  8.4 - 10.5 mg/dL Final  . GFR calc non Af Amer 11/03/2013 79* >90 mL/min Final  . GFR calc Af Amer 11/03/2013 >90  >90 mL/min Final   Comment: (NOTE)                          The eGFR has been calculated using the CKD EPI equation.                          This calculation has not been validated in all clinical situations.                          eGFR's persistently <90 mL/min signify possible Chronic Kidney                          Disease.  . Color, Urine 11/03/2013 YELLOW  YELLOW Final  . APPearance 11/03/2013 CLEAR  CLEAR Final  . Specific Gravity, Urine 11/03/2013 1.014  1.005 - 1.030 Final  . pH 11/03/2013 6.0  5.0 - 8.0 Final  . Glucose, UA 11/03/2013  NEGATIVE  NEGATIVE mg/dL Final  . Hgb urine dipstick 11/03/2013 NEGATIVE  NEGATIVE Final  . Bilirubin Urine 11/03/2013 NEGATIVE  NEGATIVE Final  . Ketones, ur 11/03/2013 NEGATIVE  NEGATIVE mg/dL Final  . Protein, ur 11/03/2013 NEGATIVE  NEGATIVE mg/dL Final  . Urobilinogen, UA 11/03/2013 0.2  0.0 - 1.0 mg/dL Final  . Nitrite 11/03/2013 NEGATIVE  NEGATIVE Final  . Leukocytes, UA 11/03/2013 TRACE* NEGATIVE Final  . Specimen Description 11/03/2013 URINE, CATHETERIZED   Final  . Special Requests 11/03/2013 NONE   Final  . Culture  Setup Time 11/03/2013    Final                   Value:11/03/2013 19:22                         Performed at [facility]  . Colony Count 11/03/2013    Final                   Value:85,000 COLONIES/ML                         Performed at [facility]  . Culture 11/03/2013    Final  Value:LACTOBACILLUS SPECIES                         Note: Standardized susceptibility testing for this organism is not available.                         Performed at [facility]  . Report Status 11/03/2013 11/04/2013 FINAL   Final  . Squamous Epithelial / LPF 11/03/2013 RARE  RARE Final  . WBC, UA 11/03/2013 3-6  <3 WBC/hpf Final  . RBC / HPF 11/03/2013 0-2  <3 RBC/hpf Final  . Bacteria, UA 11/03/2013 RARE  RARE Final      Assessment/Plan  1. History of fall At risk for additional falls  2. Atrial fibrillation Rate controlled  3. Senile dementia, uncomplicated unchanged  4. Benign paroxysmal positional vertigo unchanged  5. Wound of right leg Slowly healing at right calf. Continue current dressings.  6. Anemia improved  7. Long term (current) use of anticoagulants Continue Eliquis  8. Contusion, chest wall This will get better. Observe.

## 2013-12-27 ENCOUNTER — Non-Acute Institutional Stay: Payer: Medicare PPO | Admitting: Geriatric Medicine

## 2013-12-27 ENCOUNTER — Encounter: Payer: Self-pay | Admitting: Geriatric Medicine

## 2013-12-27 VITALS — BP 120/72 | HR 60 | Wt 107.0 lb

## 2013-12-27 DIAGNOSIS — E785 Hyperlipidemia, unspecified: Secondary | ICD-10-CM

## 2013-12-27 DIAGNOSIS — D649 Anemia, unspecified: Secondary | ICD-10-CM

## 2013-12-27 DIAGNOSIS — R42 Dizziness and giddiness: Secondary | ICD-10-CM

## 2013-12-27 NOTE — Progress Notes (Signed)
Patient ID: Barbara [name], female   DOB: 10-27-1928, 80 y.o.   MRN: [id number]   [facility] [phone])  Code Status: DNR Contact Information   Name Relation Home Work Mobile   [place] Daughter [phone]         Chief Complaint  Patient presents with  . Fall    fell 12/24/13, neck pain, BP readings from AL low 93/64 range, complaining of dizziness    HPI: This is a 80 y.o. female resident of [place], Assisted Living section.  Evaluation is requested today due to a fall on 12/24/2013 followed with neck pain. Also blood pressure readings have been low lately and patient is again complaining of dizziness.  Patient's short-term memory loss impacts her ability recall details of the fall on the 17th as well as her dizzy spells.    Allergies  Allergen Reactions  . Aricept [Donepezil Hcl]     Muscle pain    MEDICATIONS -  Reviewed  DATA REVIEWED  Radiologic Exams  Cardiovascular Exams  Laboratory Studies  Lab Results  Component Value Date   WBC 8.5 11/03/2013   HGB 12.4 11/03/2013   HCT 36.4 11/03/2013   MCV 96.6 11/03/2013   PLT 280 11/03/2013    Lab Results  Component Value Date   NA 138 11/03/2013   K 4.4 11/03/2013   BUN 17 11/03/2013   CREATININE 0.70 11/03/2013   Lab Results  Component Value Date   CALCIUM 9.1 11/03/2013   ALBUMIN 3.5 12/19/2012   AST 22 12/19/2012   ALT 13 12/19/2012   ALKPHOS 106 12/19/2012   BILITOT 0.3 12/19/2012   GFRNONAA 79* 11/03/2013   GFRAA >90 11/03/2013   No results found for this basename: vitaminb12, vitamind    REVIEW OF SYSTEMS  DATA OBTAINED: from patient, nurse, medical record,, family member (daughter, [name]) GENERAL: Feels "OK"   No recent fever, fatigue, change in appetite or weight SKIN: No itch, rash or open wounds EYES: No eye pain, dryness or itching  No change in vision EARS: No earache, tinnitus, change in hearing NOSE: No congestion, drainage or  bleeding MOUTH/THROAT: No mouth or tooth pain  No sore throat   No difficulty chewing or swallowing RESPIRATORY: No cough, wheezing, SOB CARDIAC: No chest pain, palpitations  No edema. GI: No abdominal pain  No nausea, vomiting,diarrhea or constipation  No heartburn or reflux  GU: No dysuria, frequency or urgency  No change in urine volume or character  MUSCULOSKELETAL: Initially denies any neck pain, as visit proceeded complained of some neck tightness No joint pain, swelling or stiffness  No back pain  No muscle ache, pain, weakness  Gait is steady     NEUROLOGIC: Daughter notes dizziness more persistent than previous episodes re: BPPV , No fainting, headache, numbness  No change in mental status.  PSYCHIATRIC: No feelings of anxiety, depression  Sleeps well.  No behavior issue.    PHYSICAL EXAM Filed Vitals:   12/27/13 0914  BP: 120/72  Pulse: 60  Weight: 107 lb (48.535 kg)  SpO2: 96%   Body mass index is 20.23 kg/(m^2). GENERAL APPEARANCE: No acute distress, appropriately groomed, normal body habitus. Alert, pleasant, conversant. SKIN: No diaphoresis, rash, unusual lesions, wounds HEAD: Normocephalic, atraumatic EYES: Conjunctiva/lids clear. Pupils round, reactive. EOMs intact, no gross nystagmus EARS:  Hearing impaired. NOSE: No deformity or discharge. MOUTH/THROAT: Lips w/o lesions. Oral mucosa, tongue moist, w/o lesion. Oropharynx w/o redness or lesions.  NECK: Supple, full ROM. No thyroid  tenderness, enlargement or nodule LYMPHATICS: No head, neck or supraclavicular adenopathy RESPIRATORY: Breathing is even, unlabored. Lung sounds are clear and full.  CARDIOVASCULAR: Heart IRRR. No murmur or extra heart sounds  ARTERIAL: No carotid bruit.  EDEMA: No peripheral edema. No ascites MUSCULOSKELETAL: Moves all extremities with full ROM, strength and tone. Back is without kyphosis, scoliosis or spinal process tenderness. Gait is steady NEUROLOGIC: Oriented to place, person. Cranial  nerves 2-12 grossly intact, speech clear, no tremor. PSYCHIATRIC: Mood and affect appropriate to situation   ASSESSMENT/PLAN  Dizziness and giddiness Recent episodes of  dizziness with falls and mild hypotension. Unclear if this is related to BPPV or other cause. Will decrease diltiazem to address low blood pressure. Also check electrolytes and CBC. Asked physical therapy to reevaluate for vertigo  Hyperlipidemia Patient started on low-dose statin approximately one year ago after a CVA. Patient has a history of intolerance to statins; muscle weakness and joint pain. She does not really experience these symptoms however daughter expresses concern about whether she should be taking a statin medication. Will check lipid panel  Anemia Patient is following up with Dr. Ernest Haber today, plan is for capsule endoscopy. Will update CBC tomorrow    Family/ staff Communication:  Daughter present today, agrees w/ plan  Time:   Labs/tests ordered: 12/28/13 CBC, BMP, lipid panel   Follow up: Return for As scheduled, As needed.  Mardene Celeste, NP-C [place] [phone]  12/27/2013

## 2013-12-27 NOTE — Assessment & Plan Note (Signed)
Recent episodes of  dizziness with falls and mild hypotension. Unclear if this is related to BPPV or other cause. Will decrease diltiazem to address low blood pressure. Also check electrolytes and CBC. Asked physical therapy to reevaluate for vertigo

## 2013-12-27 NOTE — Assessment & Plan Note (Signed)
Patient is following up with Dr. Ernest Haber today, plan is for capsule endoscopy. Will update CBC tomorrow

## 2013-12-27 NOTE — Assessment & Plan Note (Signed)
Patient started on low-dose statin approximately one year ago after a CVA. Patient has a history of intolerance to statins; muscle weakness and joint pain. She does not really experience these symptoms however daughter expresses concern about whether she should be taking a statin medication. Will check lipid panel

## 2013-12-28 ENCOUNTER — Telehealth: Payer: Self-pay | Admitting: Cardiology

## 2013-12-28 NOTE — Telephone Encounter (Signed)
New message     Talk to a nurse to discuss maybe dropping a medication----the statin drug

## 2013-12-29 NOTE — Telephone Encounter (Signed)
OK to DC statin at daughter's request.

## 2013-12-29 NOTE — Telephone Encounter (Signed)
Spoke with patient's daughter, she advised that NP from [facility] want to decrease [place] due to low BP  In the 90's, also she is concerned with her mother being on a statin, and in the past she never tolerated statins due to leg cramps, statin was prescribed in the hospital 1 year ago and she is worried that the statin is causing the fall. Daughter wants the patient off the statin.

## 2014-01-05 NOTE — Telephone Encounter (Signed)
Left message to call back  

## 2014-01-08 NOTE — Telephone Encounter (Signed)
Advised daughter ok to stop.

## 2014-01-08 NOTE — Telephone Encounter (Signed)
Follow up   ° ° ° °Daughter calling back to speak with nurse  °

## 2014-01-16 ENCOUNTER — Encounter: Payer: Self-pay | Admitting: Internal Medicine

## 2014-01-24 ENCOUNTER — Ambulatory Visit
Admission: RE | Admit: 2014-01-24 | Discharge: 2014-01-24 | Disposition: A | Discharge status: Home / Self Care | Payer: Medicare PPO | Source: Ambulatory Visit | Attending: Geriatric Medicine | Admitting: Geriatric Medicine

## 2014-01-24 ENCOUNTER — Encounter: Payer: Self-pay | Admitting: Geriatric Medicine

## 2014-01-24 ENCOUNTER — Non-Acute Institutional Stay: Payer: Medicare PPO | Admitting: Geriatric Medicine

## 2014-01-24 VITALS — BP 110/64 | HR 60 | Wt 105.0 lb

## 2014-01-24 DIAGNOSIS — M542 Cervicalgia: Secondary | ICD-10-CM

## 2014-01-24 NOTE — Assessment & Plan Note (Signed)
Persistent neck pain now with decreased range of motion. This appears to be muscular in origin with tenderness in the trapezius and occipital ischemic muscles.Barbara [name] obtain cervical spine x-ray to rule out structural problem. Recommend OT interventions for relief of muscle tension and improved range of motion

## 2014-01-24 NOTE — Progress Notes (Signed)
Patient ID: Barbara [name], female   DOB: 1929-01-01, 80 y.o.   MRN: [id number]   [employer] [phone]   Contact Information   Name Relation Home Work Mobile   [place] Daughter [phone]         Chief Complaint  Patient presents with  . Neck Pain    not any better  . Knee Problem    while walking yesterday right knee gave out, did not fall    HPI: This is a 80 y.o. female resident of [place],  Assisted Living section.  Evaluation is requested today due to persistent neck pain. Patient reports hearing a popping and cracking in her neck when she moves it, has discomfort with lateral range of motion. Patient was walking yesterday, using a walker, her right knee "gave out". She did not fall. She is not experiencing any pain swelling or stiffness in her knee today     Allergies  Allergen Reactions  . Aricept [Donepezil Hcl]     Muscle pain    MEDICATIONS -  Reviewed  DATA REVIEWED  Radiologic Exams:   Laboratory Studies:   REVIEW OF SYSTEMS  DATA OBTAINED: from patient,, family member(daughter, Kim) GENERAL: Feels well  No recent fever, fatigue, change in activity status, appetite, or weight  RESPIRATORY: No cough, wheezing, SOB CARDIAC: No chest pain, palpitations. No edema GI: No abdominal pain  No Nausea,vomiting,diarrhea or constipation  No heartburn or reflux  MUSCULOSKELETAL: See HPI    Gait is unsteady    No recent falls  NEUROLOGIC: No dizziness, fainting, headache,  No change in mental status  (STML) PSYCHIATRIC: No feelings of anxiety, depression  Sleeps well   No behavior issue   PHYSICAL EXAM Filed Vitals:   01/24/14 1148  BP: 110/64  Pulse: 60  Weight: 105 lb (47.628 kg)   Body mass index is 19.85 kg/(m^2). GENERAL APPEARANCE: No acute distress, appropriately groomed, normal body habitus Alert, pleasant, conversant. SKIN: No diaphoresis, rash, wound HEAD: Normocephalic, atraumatic EYES:  Conjunctiva/lids clear  RESPIRATORY: Breathing is even, unlabored     EDEMA: No peripheral edema   MUSCULOSKELETAL.  Gait is unsteady, steady w/ walker.  Tenderness at bilateral occipital muscles, bilateral upper/mid trapezius. Decreased lateral ROM due to discomfort. No tenderness along cervical spine processes PSYCHIATRIC: Mood and affect appropriate to situation    ASSESSMENT/PLAN  Neck pain Persistent neck pain now with decreased range of motion. This appears to be muscular in origin with tenderness in the trapezius and occipital ischemic muscles.Marland Kitchen obtain cervical spine x-ray to rule out structural problem. Recommend OT interventions for relief of muscle tension and improved range of motion    Family/ staff Communication:  Present for this and today, agrees with plan   Labs/tests ordered: Cervical spine films   Follow up: Return if symptoms worsen or fail to improve, for As scheduled, with Dr.Green.  Mardene Celeste, NP-C [place] [phone]  01/24/2014

## 2014-01-30 LAB — CBC AND DIFFERENTIAL
HEMATOCRIT: 36 % (ref 36–46)
Hemoglobin: 11.8 g/dL — AB (ref 12.0–16.0)
Platelets: 242 10*3/uL (ref 150–399)
WBC: 6 10^3/mL

## 2014-02-26 ENCOUNTER — Encounter: Payer: Self-pay | Admitting: Internal Medicine

## 2014-02-26 ENCOUNTER — Non-Acute Institutional Stay: Payer: Medicare PPO | Admitting: Internal Medicine

## 2014-02-26 VITALS — BP 100/62 | HR 76 | Wt 109.0 lb

## 2014-02-26 DIAGNOSIS — M542 Cervicalgia: Secondary | ICD-10-CM

## 2014-02-26 DIAGNOSIS — M545 Low back pain, unspecified: Secondary | ICD-10-CM

## 2014-02-26 DIAGNOSIS — Z7901 Long term (current) use of anticoagulants: Secondary | ICD-10-CM

## 2014-02-26 DIAGNOSIS — I4891 Unspecified atrial fibrillation: Secondary | ICD-10-CM

## 2014-02-26 DIAGNOSIS — I482 Chronic atrial fibrillation, unspecified: Secondary | ICD-10-CM

## 2014-02-26 DIAGNOSIS — M810 Age-related osteoporosis without current pathological fracture: Secondary | ICD-10-CM

## 2014-02-26 DIAGNOSIS — F0390 Unspecified dementia without behavioral disturbance: Secondary | ICD-10-CM

## 2014-02-26 DIAGNOSIS — D649 Anemia, unspecified: Secondary | ICD-10-CM

## 2014-02-26 NOTE — Progress Notes (Signed)
Patient ID: Barbara [name], female   DOB: 01-20-1929, 80 y.o.   MRN: [id number]    Location:  [place] of Service: Clinic (12)    Allergies  Allergen Reactions  . Aricept [Donepezil Hcl]     Muscle pain    Chief Complaint  Patient presents with  . Medical Management of Chronic Issues    A-Fib, dementia, anemia thyroid.  With daughter [name]    HPI:  Chronic atrial fibrillation: controlled rate  Long term (current) use of anticoagulants: on Eliquis  Senile dementia, uncomplicated: stable  Midline low back pain without sciatica: improved  Anemia, unspecified anemia type: stable  Senile osteoporosis: remains on Forteo  Neck pain: improved with Physical Therapy    Medications: Patient's Medications  New Prescriptions   No medications on file  Previous Medications   ACETAMINOPHEN (TYLENOL) 325 MG TABLET    Take 650 mg by mouth 2 (two) times daily.   ATORVASTATIN (LIPITOR) 10 MG TABLET    Take one tablet daily   CALCIUM CARBONATE-VITAMIN D (CALTRATE 600+D) 600-400 MG-UNIT PER CHEW TABLET    Chew 1 tablet by mouth daily.   CARBOXYMETHYLCELLULOSE (REFRESH PLUS) 0.5 % SOLN    Place 1 drop into both eyes every 4 (four) hours as needed.    CHOLECALCIFEROL (VITAMIN D) 1000 UNITS TABLET    Take 1,000 Units by mouth daily.   DILTIAZEM CD 240 MG 24 HR CAPSULE    Take one tablet daily   DORZOLAMIDE-TIMOLOL (COSOPT) 22.3-6.8 MG/ML OPHTHALMIC SOLUTION    Place 1 drop into both eyes 2 (two) times daily.   ELIQUIS 2.5 MG TABS TABLET    Take 2.5 mg by mouth 2 (two) times daily.    LEVOTHYROXINE (SYNTHROID, LEVOTHROID) 75 MCG TABLET    Take 75 mcg by mouth daily before breakfast.   MENTHOL, TOPICAL ANALGESIC, (BENGAY COLD THERAPY) 5 % GEL    Apply 1 application topically 4 (four) times daily. Apply to lower back and/or bilateral hips with pain up to 4 times daily.   MULTIPLE VITAMINS-MINERALS (PRESERVISION/LUTEIN) CAPS    Take 2 capsules by mouth daily.     POLYETHYLENE GLYCOL (MIRALAX / GLYCOLAX) PACKET    Take 17 g by mouth daily. Mix with 6oz beverage of choice. Hold for loose stool   SENNOSIDES-DOCUSATE SODIUM (SENOKOT-S) 8.6-50 MG TABLET    Take 1 tablet by mouth at bedtime as needed for constipation.    SERTRALINE (ZOLOFT) 50 MG TABLET    Take 50 mg by mouth at bedtime.   TERIPARATIDE, RECOMBINANT, (FORTEO) 600 MCG/2.4ML SOLN    Inject 20 mcg into the skin every morning.   TRAVATAN Z 0.004 % SOLN OPHTHALMIC SOLUTION    Place 1 drop into both eyes daily.   Modified Medications   No medications on file  Discontinued Medications   No medications on file     Review of Systems  Constitutional: Positive for activity change. Negative for fever, chills, diaphoresis, appetite change and fatigue.  HENT: Positive for hearing loss.        Right facial weakness  Eyes:       Wears prescription lenses  Respiratory: Negative for cough, choking, chest tightness, shortness of breath, wheezing and stridor.        Tender left chest wall  Cardiovascular: Negative for chest pain, palpitations and leg swelling.        History chronic atrial fibrillation  Gastrointestinal: Negative for abdominal pain and abdominal distention.  Endocrine: Negative.  Genitourinary:       Urgency and incontinence  Musculoskeletal:       Right-sided weakness.  Skin:       Slowly healing would of the right calf.  Allergic/Immunologic: Negative.   Neurological: Positive for dizziness.       History of right facial weakness and right hemiparesis. Dementia.  Hematological:       Hx anemia requiring transfusion. No gross GI bleeding was noted. Hgb nl Aug 2014.  Psychiatric/Behavioral:       Chronic dementia    Filed Vitals:   02/26/14 1339  BP: 100/62  Pulse: 76  Weight: 109 lb (49.442 kg)   Body mass index is 20.61 kg/(m^2).  Physical Exam  Constitutional: She appears well-nourished. No distress.  Frail elderly female  HENT:  Head: Normocephalic and  atraumatic.  Right Ear: External ear normal.  Nose: Nose normal.  Eyes: EOM are normal. Pupils are equal, round, and reactive to light.  Neck: Normal range of motion. Neck supple. No JVD present. No tracheal deviation present. No thyromegaly present.  Cardiovascular: Normal rate and regular rhythm.  Exam reveals no gallop and no friction rub.   Murmur heard. HxAtrial fibrillation; currently in NSR  Pulmonary/Chest: Effort normal and breath sounds normal. No respiratory distress. She has no wheezes. She has no rales. She exhibits tenderness (eft chest wall).  Abdominal: Soft. Bowel sounds are normal. She exhibits no distension and no mass. There is no tenderness.  Musculoskeletal: She exhibits no edema and no tenderness.  Weakness of the right arm when sheattempts to extend it. Weak in the right leg when she attempts to raise against gravity.  Lymphadenopathy:    She has no cervical adenopathy.  Neurological: No cranial nerve deficit. Coordination normal.  Loss of memory  Skin: Skin is warm and dry. No rash noted. She is not diaphoretic. No erythema. No pallor.  Slowly healing wound of the right mid calf. No signs of infection.  Psychiatric: She has a normal mood and affect. Her behavior is normal.     Labs reviewed: Nursing Home on 02/26/2014  Component Date Value Ref Range Status  . Hemoglobin 01/30/2014 11.8* 12.0 - 16.0 g/dL Final  . HCT 01/30/2014 36  36 - 46 % Final  . Platelets 01/30/2014 242  150 - 399 K/L Final  . WBC 01/30/2014 6.0   Final      Assessment/Plan 1. Chronic atrial fibrillation Rate controlled  2. Long term (current) use of anticoagulants Continue Eliquis  3. Senile dementia, uncomplicated MMSE next visit  4. Midline low back pain without sciatica improved  5. Anemia, unspecified anemia type stable  6. Senile osteoporosis Continue Forteo  7. Neck pain Improved   Lab to consider for next visit; Lipids, CBC, CMP, TSH

## 2014-02-27 ENCOUNTER — Encounter: Payer: Self-pay | Admitting: Cardiology

## 2014-02-27 ENCOUNTER — Encounter (INDEPENDENT_AMBULATORY_CARE_PROVIDER_SITE_OTHER): Payer: Self-pay

## 2014-02-27 ENCOUNTER — Ambulatory Visit (INDEPENDENT_AMBULATORY_CARE_PROVIDER_SITE_OTHER): Payer: Medicare PPO | Admitting: Cardiology

## 2014-02-27 VITALS — BP 128/68 | HR 73 | Wt 107.0 lb

## 2014-02-27 DIAGNOSIS — Z7901 Long term (current) use of anticoagulants: Secondary | ICD-10-CM

## 2014-02-27 DIAGNOSIS — I4891 Unspecified atrial fibrillation: Secondary | ICD-10-CM

## 2014-02-27 DIAGNOSIS — Z9181 History of falling: Secondary | ICD-10-CM

## 2014-02-27 DIAGNOSIS — I482 Chronic atrial fibrillation, unspecified: Secondary | ICD-10-CM

## 2014-02-27 DIAGNOSIS — E785 Hyperlipidemia, unspecified: Secondary | ICD-10-CM

## 2014-02-27 NOTE — Patient Instructions (Signed)
The current medical regimen is effective;  continue present plan and medications.  Follow up in 6 months with Dr. Skains.  You will receive a letter in the mail 2 months before you are due.  Please call us when you receive this letter to schedule your follow up appointment.  

## 2014-02-27 NOTE — Progress Notes (Signed)
Barbara [name]. [address], [address] [place], [place]  [address] Phone: [phone] Fax:  [phone]  Date:  02/27/2014   ID:  Barbara [name], DOB 1928-10-28, MRN [id number]  PCP:  Estill Dooms, MD   History of Present Illness: Barbara [name] is a 80 y.o. female with paroxysmal atrial fibrillation prior right rapid ventricular response and July 23 of 2009, nuclear stress test in 2008 low-risk, ejection fraction 70%, anemia, stroke 5/14 with new onset anticoagulation here for followup. In May she was here for followup visit then shortly thereafter, went to the hospital and was diagnosed with stroke.  AFIB - During prior clinic encounter, she has demonstrated atrial fibrillation with no symptoms. No palpiations, no symptoms. No syncope. No CP. No strokelike symptoms.   Cardizem has provided stability in regards to her atrial fibrillation. No chest pain. LDL was 107. TSH 2.1 doing very well without any complaints.   Recently was discovered to be significantly anemic during blood work on 12/05/12. Hemoglobin post transfusion was 11.9. Hemoglobin was down to 8.2 on 11/10/12. Previously was 13.7. Dr. [name] with gastroenterology graciously has seen her in consultation and recommended continuing to monitor her fecal blood cards while restarting anticoagulation. Her primary physician is currently Dr. [name] at [facility]. He is also managing anemia. Dr. [name] felt as though was stable for her to start her anticoagulation. Hg 11.5.  02/27/14-doing very well, no problems. Excellent. Minor bruising. She continues with low-dose atorvastatin started after stroke. I'm fine with her continuing this.    Wt Readings from Last 3 Encounters:  02/27/14 107 lb (48.535 kg)  02/26/14 109 lb (49.442 kg)  01/24/14 105 lb (47.628 kg)     Past Medical History  Diagnosis Date  . Atrial fibrillation 2009    Long term anticoagulation. Warfarin changed to Xarelto 06/2012  . Cystocele, midline   . Senile  dementia, uncomplicated   . Generalized anxiety disorder   . Unspecified glaucoma   . Macular degeneration (senile) of retina, unspecified   . Osteoarthrosis, unspecified whether generalized or localized, unspecified site   . Senile osteoporosis   . Pneumonia, organism unspecified 03/2012    Tx w/ PO antibx  . Unspecified vitamin D deficiency   . Unspecified venous (peripheral) insufficiency   . Tear film insufficiency, unspecified 08/2012  . Debility, unspecified 04/01/2012  . Long term (current) use of anticoagulants 04/01/2012  . CVA (cerebral infarction) 11/19/2012  . Anemia 11/2012    transfusion 11/2012  . Unspecified hypothyroidism   . Other and unspecified hyperlipidemia     Atorvastatin started 12/2012  . Benign paroxysmal positional vertigo 11/08/2013  . Glaucoma   . Stroke 2014  . Contusion, chest wall 11/27/2013    Left side.   Marland Kitchen History of fall 11/27/2013  . Wound of right leg 11/13/2013    posteriorly     Past Surgical History  Procedure Laterality Date  . Tonsillectomy and adenoidectomy    . Umbilical hernia repair    . Cataract extraction w/ intraocular lens  implant, bilateral    . Spine surgery      RE; Spinal stenosis  . Esophagogastroduodenoscopy (egd) with propofol N/A 11/21/2013    Procedure: ESOPHAGOGASTRODUODENOSCOPY (EGD) WITH PROPOFOL;  Surgeon: Cleotis Nipper, MD;  Location: [facility] ENDOSCOPY;  Service: Endoscopy;  Laterality: N/A;  . Colonoscopy with propofol N/A 11/21/2013    Procedure: COLONOSCOPY WITH PROPOFOL;  Surgeon: Cleotis Nipper, MD;  [facility] END[facility]COPY;  Service: Endoscopy;  Laterality: N/A;    Current Outpatient Prescriptions  Medication Sig Dispense Refill  . acetaminophen (TYLENOL) 325 MG tablet Take 650 mg by mouth 2 (two) times daily.      . Calcium Carbonate-Vitamin D (CALTRATE 600+D) 600-400 MG-UNIT per chew tablet Chew 1 tablet by mouth daily.      . carboxymethylcellulose (REFRESH PLUS) 0.5 % SOLN Place 1 drop into both eyes every 4  (four) hours as needed.       . cholecalciferol (VITAMIN D) 1000 UNITS tablet Take 1,000 Units by mouth daily.      Marland Kitchen DILTIAZEM CD 240 MG 24 hr capsule Take one tablet daily      . dorzolamide-timolol (COSOPT) 22.3-6.8 MG/ML ophthalmic solution Place 1 drop into both eyes 2 (two) times daily.      Marland Kitchen ELIQUIS 2.5 MG TABS tablet Take 2.5 mg by mouth 2 (two) times daily.       Marland Kitchen levothyroxine (SYNTHROID, LEVOTHROID) 75 MCG tablet Take 75 mcg by mouth daily before breakfast.      . Menthol, Topical Analgesic, (BENGAY COLD THERAPY) 5 % GEL Apply 1 application topically 4 (four) times daily. Apply to lower back and/or bilateral hips with pain up to 4 times daily.      . Multiple Vitamins-Minerals (PRESERVISION/LUTEIN) CAPS Take 2 capsules by mouth daily.      . polyethylene glycol (MIRALAX / GLYCOLAX) packet Take 17 g by mouth daily. Mix with 6oz beverage of choice. Hold for loose stool      . sennosides-docusate sodium (SENOKOT-S) 8.6-50 MG tablet Take 1 tablet by mouth at bedtime as needed for constipation.       . sertraline (ZOLOFT) 50 MG tablet Take 50 mg by mouth at bedtime.      . Teriparatide, Recombinant, (FORTEO) 600 MCG/2.4ML SOLN Inject 20 mcg into the skin every morning.      Marland Kitchen atorvastatin (LIPITOR) 10 MG tablet Take one tablet daily       No current facility-administered medications for this visit.    Allergies:    Allergies  Allergen Reactions  . Aricept [Donepezil Hcl]     Muscle pain    Social History:  The patient  reports that she quit smoking about 32 years ago. Her smoking use included Cigarettes. She smoked 0.00 packs per day. She has never used smokeless tobacco. She reports that she does not drink alcohol or use illicit drugs. ROS:  Please see the history of present illness.   Denies any significant bleeding, orthopnea, PND    PHYSICAL EXAM: VS:  BP 128/68  Pulse 73  Wt 107 lb (48.535 kg) Well nourished, well developed, in no acute distressElderly HEENT:  normal Neck: no JVD Cardiac:  Irregularly irregular, normal rate no murmur Lungs:  clear to auscultation bilaterally, no wheezing, rhonchi or rales Abd: soft, nontender, no hepatomegaly Ext: no edema, minor bruising lower extremities Skin: warm and dry Neuro: no focal abnormalities noted  EKG:  None today, previous atrial fibrillation with rate control     ASSESSMENT AND PLAN:  1. Atrial fibrillation-permanent-anticoagulation present. Eliquis 2.5 mg twice a day. Age and weight adjusted. She is overall doing very well, no significant bleeding. We will check blood work, basic metabolic profile and CBC every 6 months. Rate controlled with diltiazem. 2. Hyperlipidemia-continue with low-dose atorvastatin. Post stroke. Multiple falls. Would be fine with her continue.  3. History of stroke.  4. Chronic anticoagulation-no significant changes.   Signed, Candee Furbish, MD [facility]  02/27/2014 3:42 PM

## 2014-03-08 ENCOUNTER — Telehealth: Payer: Self-pay | Admitting: Internal Medicine

## 2014-03-08 NOTE — Telephone Encounter (Signed)
Prolia Injection Approval  03/08/13 - 03/09/15 Authorization # [id number] Authorization # [id number]

## 2014-03-09 ENCOUNTER — Telehealth: Payer: Self-pay | Admitting: *Deleted

## 2014-03-09 NOTE — Telephone Encounter (Signed)
Received Approval notice from [facility] on 03/08/14.  Authorization # [id number] & [id number] Given to Avel Sensor

## 2014-03-12 ENCOUNTER — Encounter: Payer: Self-pay | Admitting: Internal Medicine

## 2014-03-16 ENCOUNTER — Telehealth: Payer: Self-pay

## 2014-03-16 NOTE — Telephone Encounter (Signed)
Patient is on Forteo for osteoporosis as of 09/11/13. Re-authorization of Prolia was done in error.

## 2014-05-22 ENCOUNTER — Non-Acute Institutional Stay: Payer: Medicare PPO | Admitting: Nurse Practitioner

## 2014-05-22 DIAGNOSIS — R0989 Other specified symptoms and signs involving the circulatory and respiratory systems: Secondary | ICD-10-CM

## 2014-05-22 DIAGNOSIS — K30 Functional dyspepsia: Secondary | ICD-10-CM

## 2014-05-22 NOTE — Addendum Note (Signed)
Addended by: Lauree Chandler on: 05/22/2014 04:40 PM   Modules accepted: Level of Service

## 2014-05-22 NOTE — Progress Notes (Signed)
Patient ID: Barbara [name], female   DOB: Aug 18, 1928, 80 y.o.   MRN: [id number]    Nursing Home Location:  [place] of Service: ALF (13)  PCP: Estill Dooms, MD  Allergies  Allergen Reactions  . Aricept [Donepezil Hcl]     Muscle pain    Chief Complaint  Patient presents with  . Acute Visit    HPI:  This is a 80 y.o. female resident of [place], Assisted Living section. Evaluation is requested today from nursing due to cough and congestion. Pt with a 3 day history of coughing up sputum throughout the day. Generally has increase sputum production in the morning but resolves shortly after she wakes up. Over the past few days this has progressively gotten worse. Denies shortness of breath or chest pain, no fevers or chills. Also noted coughing episode that resulted in vomiting. Reports upset stomach for several days. No diarrhea or constipation noted.   Review of Systems:  Review of Systems  Constitutional: Positive for activity change. Negative for fever, chills, diaphoresis, appetite change and fatigue.  HENT: Positive for hearing loss.        Right facial weakness  Eyes:       Wears prescription lenses  Respiratory: Positive for cough. Negative for chest tightness, shortness of breath, wheezing and stridor.   Cardiovascular: Negative for chest pain, palpitations and leg swelling.        History chronic atrial fibrillation  Gastrointestinal: Negative for abdominal pain, diarrhea, constipation and abdominal distention.  Endocrine: Negative.   Genitourinary:       Urgency and incontinence  Musculoskeletal:       Right-sided weakness.  Allergic/Immunologic: Negative.   Neurological: Positive for dizziness.       History of right facial weakness and right hemiparesis. Dementia.  Hematological:       Hx anemia requiring transfusion. No gross GI bleeding was noted. Hgb nl Aug 2014.  Psychiatric/Behavioral:   Chronic dementia- caregiver and daughter providing most of ROS    Past Medical History  Diagnosis Date  . Atrial fibrillation 2009    Long term anticoagulation. Warfarin changed to Xarelto 06/2012  . Cystocele, midline   . Senile dementia, uncomplicated   . Generalized anxiety disorder   . Unspecified glaucoma   . Macular degeneration (senile) of retina, unspecified   . Osteoarthrosis, unspecified whether generalized or localized, unspecified site   . Senile osteoporosis   . Pneumonia, organism unspecified 03/2012    Tx w/ PO antibx  . Unspecified vitamin D deficiency   . Unspecified venous (peripheral) insufficiency   . Tear film insufficiency, unspecified 08/2012  . Debility, unspecified 04/01/2012  . Long term (current) use of anticoagulants 04/01/2012  . CVA (cerebral infarction) 11/19/2012  . Anemia 11/2012    transfusion 11/2012  . Unspecified hypothyroidism   . Other and unspecified hyperlipidemia     Atorvastatin started 12/2012  . Benign paroxysmal positional vertigo 11/08/2013  . Glaucoma   . Stroke 2014  . Contusion, chest wall 11/27/2013    Left side.   Marland Kitchen History of fall 11/27/2013  . Wound of right leg 11/13/2013    posteriorly    Past Surgical History  Procedure Laterality Date  . Tonsillectomy and adenoidectomy    . Umbilical hernia repair    . Cataract extraction w/ intraocular lens  implant, bilateral    . Spine surgery      RE; Spinal stenosis  . Esophagogastroduodenoscopy (egd) with propofol  N/A 11/21/2013    Procedure: ESOPHAGOGASTRODUODENOSCOPY (EGD) WITH PROPOFOL;  Surgeon: Cleotis Nipper, MD;  Location: [facility] ENDOSCOPY;  Service: Endoscopy;  Laterality: N/A;  . Colonoscopy with propofol N/A 11/21/2013    Procedure: COLONOSCOPY WITH PROPOFOL;  Surgeon: Cleotis Nipper, MD;  Location: [facility] ENDOSCOPY;  Service: Endoscopy;  Laterality: N/A;   Social History:   reports that she quit smoking about 33 years ago. Her smoking use included Cigarettes. She smoked 0.00 packs  per day. She has never used smokeless tobacco. She reports that she does not drink alcohol or use illicit drugs.  Family History  Problem Relation Age of Onset  . Heart attack Father     Medications: Patient's Medications  New Prescriptions   No medications on file  Previous Medications   ACETAMINOPHEN (TYLENOL) 325 MG TABLET    Take 650 mg by mouth 2 (two) times daily.   ATORVASTATIN (LIPITOR) 10 MG TABLET    Take one tablet daily   CALCIUM CARBONATE-VITAMIN D (CALTRATE 600+D) 600-400 MG-UNIT PER CHEW TABLET    Chew 1 tablet by mouth daily.   CARBOXYMETHYLCELLULOSE (REFRESH PLUS) 0.5 % SOLN    Place 1 drop into both eyes every 4 (four) hours as needed.    CHOLECALCIFEROL (VITAMIN D) 1000 UNITS TABLET    Take 1,000 Units by mouth daily.   DILTIAZEM CD 240 MG 24 HR CAPSULE    Take one tablet daily   DORZOLAMIDE-TIMOLOL (COSOPT) 22.3-6.8 MG/ML OPHTHALMIC SOLUTION    Place 1 drop into both eyes 2 (two) times daily.   ELIQUIS 2.5 MG TABS TABLET    Take 2.5 mg by mouth 2 (two) times daily.    LEVOTHYROXINE (SYNTHROID, LEVOTHROID) 75 MCG TABLET    Take 75 mcg by mouth daily before breakfast.   MENTHOL, TOPICAL ANALGESIC, (BENGAY COLD THERAPY) 5 % GEL    Apply 1 application topically 4 (four) times daily. Apply to lower back and/or bilateral hips with pain up to 4 times daily.   MULTIPLE VITAMINS-MINERALS (PRESERVISION/LUTEIN) CAPS    Take 2 capsules by mouth daily.   POLYETHYLENE GLYCOL (MIRALAX / GLYCOLAX) PACKET    Take 17 g by mouth daily. Mix with 6oz beverage of choice. Hold for loose stool   SENNOSIDES-DOCUSATE SODIUM (SENOKOT-S) 8.6-50 MG TABLET    Take 1 tablet by mouth at bedtime as needed for constipation.    SERTRALINE (ZOLOFT) 50 MG TABLET    Take 50 mg by mouth at bedtime.   TERIPARATIDE, RECOMBINANT, (FORTEO) 600 MCG/2.4ML SOLN    Inject 20 mcg into the skin every morning.  Modified Medications   No medications on file  Discontinued Medications   No medications on file      Physical Exam: Filed Vitals:   05/22/14 1553  BP: 164/94  Pulse: 67  Temp: 97.9 F (36.6 C)  Resp: 18  SpO2: 96%    Physical Exam  Constitutional: She appears well-nourished. No distress.  Frail elderly female  HENT:  Head: Normocephalic and atraumatic.  Right Ear: External ear normal.  Nose: Nose normal.  Eyes: EOM are normal. Pupils are equal, round, and reactive to light.  Neck: Normal range of motion. Neck supple. No JVD present. No tracheal deviation present. No thyromegaly present.  Cardiovascular: Normal rate.  An irregular rhythm present. Exam reveals no gallop and no friction rub.   Murmur heard. HxAtrial fibrillation;   Pulmonary/Chest: Effort normal. No respiratory distress. She has wheezes. She has rales.  Wheezing and rales noted to lower lobes  Abdominal: Soft. Bowel sounds are normal. She exhibits no distension and no mass. There is no tenderness.  Musculoskeletal: She exhibits no edema and no tenderness.  Lymphadenopathy:    She has no cervical adenopathy.  Neurological: No cranial nerve deficit. Coordination normal.  Loss of memory  Skin: Skin is warm and dry. She is not diaphoretic.  Psychiatric: She has a normal mood and affect. Her behavior is normal.    Labs reviewed: Basic Metabolic Panel:  Recent Labs  08/14/13 1500 11/03/13 1422  NA 139 138  K 3.6 4.4  CL 106 103  CO2 26 21  GLUCOSE 104* 87  BUN 21 17  CREATININE 0.9 0.70  CALCIUM 9.0 9.1   Liver Function Tests: No results found for this basename: AST, ALT, ALKPHOS, BILITOT, PROT, ALBUMIN,  in the last 8760 hours No results found for this basename: LIPASE, AMYLASE,  in the last 8760 hours No results found for this basename: AMMONIA,  in the last 8760 hours CBC:  Recent Labs  08/14/13 1500 11/03/13 1422 01/30/14  WBC 7.1 8.5 6.0  NEUTROABS 4.3 5.5  --   HGB 13.0 12.4 11.8*  HCT 38.4 36.4 36  MCV 97.3 96.6  --   PLT 201.0 280 242   TSH: No results found for this  basename: TSH,  in the last 8760 hours A1C: Lab Results  Component Value Date   HGBA1C 5.5 12/20/2012   Lipid Panel: No results found for this basename: CHOL, HDL, LDLCALC, TRIG, CHOLHDL, LDLDIRECT,  in the last 8760 hours   Assessment/Plan 1. Chest congestion With cough and possible pneumonia, daughter at bedside, discussed plan with her, we will hold off on any antibiotics until xray is done Will get chest xray to rule out pneumonia duo nebs TID for 7 days, mucinex DM BID for 7 days  2. Upset stomach -one episode of emesis which was likely due to coughing -will start florastore BID for 10 day

## 2014-06-04 ENCOUNTER — Non-Acute Institutional Stay: Payer: Medicare PPO | Admitting: Nurse Practitioner

## 2014-06-04 DIAGNOSIS — J209 Acute bronchitis, unspecified: Secondary | ICD-10-CM

## 2014-06-04 NOTE — Progress Notes (Signed)
Patient ID: Barbara [name], female   DOB: [birth year], 80 y.o.   MRN: [id number] Patient ID: Barbara [name], female   DOB: 1929/03/31, 81 y.o.   MRN: [id number]    Nursing Home Location:  [place] of Service: ALF (13)  PCP: Estill Dooms, MD  Allergies  Allergen Reactions  . Aricept [Donepezil Hcl]     Muscle pain    Chief Complaint  Patient presents with  . Acute Visit    HPI:  This is a 80 y.o. female resident of [place], Assisted Living section. Evaluation is requested today from nursing due to on-going cough and congestion. pt was treated with mucinex and duonebs 2 weeks with some improvement but now with occasionally increase in congestion and cough.  Pt with feelings of fatigue. Has chest xray upon initial evaluation which was negative.  Denies shortness of breath or chest pain, no fevers or chills.    Review of Systems:  Review of Systems  Constitutional: Positive for activity change. Negative for fever, chills, diaphoresis, appetite change and fatigue.  HENT: Positive for hearing loss.        Right facial weakness  Eyes:       Wears prescription lenses  Respiratory: Positive for cough. Negative for chest tightness, shortness of breath, wheezing and stridor.   Cardiovascular: Negative for chest pain, palpitations and leg swelling.        History chronic atrial fibrillation  Gastrointestinal: Negative for abdominal pain, diarrhea, constipation and abdominal distention.  Endocrine: Negative.   Genitourinary:       Urgency and incontinence  Musculoskeletal:       Right-sided weakness.  Allergic/Immunologic: Negative.   Neurological: Positive for dizziness.       History of right facial weakness and right hemiparesis. Dementia.  Psychiatric/Behavioral:       Chronic dementia    Past Medical History  Diagnosis Date  . Atrial fibrillation 2009    Long term anticoagulation. Warfarin changed to Xarelto  06/2012  . Cystocele, midline   . Senile dementia, uncomplicated   . Generalized anxiety disorder   . Unspecified glaucoma   . Macular degeneration (senile) of retina, unspecified   . Osteoarthrosis, unspecified whether generalized or localized, unspecified site   . Senile osteoporosis   . Pneumonia, organism unspecified 03/2012    Tx w/ PO antibx  . Unspecified vitamin D deficiency   . Unspecified venous (peripheral) insufficiency   . Tear film insufficiency, unspecified 08/2012  . Debility, unspecified 04/01/2012  . Long term (current) use of anticoagulants 04/01/2012  . CVA (cerebral infarction) 11/19/2012  . Anemia 11/2012    transfusion 11/2012  . Unspecified hypothyroidism   . Other and unspecified hyperlipidemia     Atorvastatin started 12/2012  . Benign paroxysmal positional vertigo 11/08/2013  . Glaucoma   . Stroke 2014  . Contusion, chest wall 11/27/2013    Left side.   Marland Kitchen History of fall 11/27/2013  . Wound of right leg 11/13/2013    posteriorly    Past Surgical History  Procedure Laterality Date  . Tonsillectomy and adenoidectomy    . Umbilical hernia repair    . Cataract extraction w/ intraocular lens  implant, bilateral    . Spine surgery      RE; Spinal stenosis  . Esophagogastroduodenoscopy (egd) with propofol N/A 11/21/2013    Procedure: ESOPHAGOGASTRODUODENOSCOPY (EGD) WITH PROPOFOL;  Surgeon: Cleotis Nipper, MD;  Location: [facility] ENDOSCOPY;  Service: Endoscopy;  Laterality: N/A;  .  Colonoscopy with propofol N/A 11/21/2013    Procedure: COLONOSCOPY WITH PROPOFOL;  Surgeon: Cleotis Nipper, MD;  Location: [facility] ENDOSCOPY;  Service: Endoscopy;  Laterality: N/A;   Social History:   reports that she quit smoking about 33 years ago. Her smoking use included Cigarettes. She smoked 0.00 packs per day. She has never used smokeless tobacco. She reports that she does not drink alcohol or use illicit drugs.  Family History  Problem Relation Age of Onset  . Heart attack Father      Medications: Patient's Medications  New Prescriptions   No medications on file  Previous Medications   ACETAMINOPHEN (TYLENOL) 325 MG TABLET    Take 650 mg by mouth 2 (two) times daily.   ATORVASTATIN (LIPITOR) 10 MG TABLET    Take one tablet daily   CALCIUM CARBONATE-VITAMIN D (CALTRATE 600+D) 600-400 MG-UNIT PER CHEW TABLET    Chew 1 tablet by mouth daily.   CARBOXYMETHYLCELLULOSE (REFRESH PLUS) 0.5 % SOLN    Place 1 drop into both eyes every 4 (four) hours as needed.    CHOLECALCIFEROL (VITAMIN D) 1000 UNITS TABLET    Take 1,000 Units by mouth daily.   DILTIAZEM CD 240 MG 24 HR CAPSULE    Take one tablet daily   DORZOLAMIDE-TIMOLOL (COSOPT) 22.3-6.8 MG/ML OPHTHALMIC SOLUTION    Place 1 drop into both eyes 2 (two) times daily.   ELIQUIS 2.5 MG TABS TABLET    Take 2.5 mg by mouth 2 (two) times daily.    LEVOTHYROXINE (SYNTHROID, LEVOTHROID) 75 MCG TABLET    Take 75 mcg by mouth daily before breakfast.   MENTHOL, TOPICAL ANALGESIC, (BENGAY COLD THERAPY) 5 % GEL    Apply 1 application topically 4 (four) times daily. Apply to lower back and/or bilateral hips with pain up to 4 times daily.   MULTIPLE VITAMINS-MINERALS (PRESERVISION/LUTEIN) CAPS    Take 2 capsules by mouth daily.   POLYETHYLENE GLYCOL (MIRALAX / GLYCOLAX) PACKET    Take 17 g by mouth daily. Mix with 6oz beverage of choice. Hold for loose stool   SENNOSIDES-DOCUSATE SODIUM (SENOKOT-S) 8.6-50 MG TABLET    Take 1 tablet by mouth at bedtime as needed for constipation.    SERTRALINE (ZOLOFT) 50 MG TABLET    Take 50 mg by mouth at bedtime.   TERIPARATIDE, RECOMBINANT, (FORTEO) 600 MCG/2.4ML SOLN    Inject 20 mcg into the skin every morning.  Modified Medications   No medications on file  Discontinued Medications   No medications on file     Physical Exam: Filed Vitals:   06/04/14 1516  BP: 144/74  Pulse: 77  Temp: 97.6 F (36.4 C)  Resp: 18    Physical Exam  Constitutional: She appears well-nourished. No distress.   Frail elderly female  HENT:  Head: Normocephalic and atraumatic.  Right Ear: External ear normal.  Nose: Nose normal.  Eyes: EOM are normal. Pupils are equal, round, and reactive to light.  Neck: Normal range of motion. Neck supple. No JVD present. No tracheal deviation present. No thyromegaly present.  Cardiovascular: Normal rate.  An irregular rhythm present. Exam reveals no gallop and no friction rub.   Murmur heard. HxAtrial fibrillation;   Pulmonary/Chest: Effort normal. No respiratory distress. She has no wheezes. She has no rales.  Abdominal: Soft. Bowel sounds are normal. She exhibits no distension and no mass. There is no tenderness.  Musculoskeletal: She exhibits no edema and no tenderness.  Lymphadenopathy:    She has no cervical adenopathy.  Neurological: No cranial nerve deficit. Coordination normal.  Loss of memory  Skin: Skin is warm and dry. She is not diaphoretic.  Psychiatric: She has a normal mood and affect. Her behavior is normal.    Labs reviewed: Basic Metabolic Panel:  Recent Labs  08/14/13 1500 11/03/13 1422  NA 139 138  K 3.6 4.4  CL 106 103  CO2 26 21  GLUCOSE 104* 87  BUN 21 17  CREATININE 0.9 0.70  CALCIUM 9.0 9.1   Liver Function Tests: No results found for this basename: AST, ALT, ALKPHOS, BILITOT, PROT, ALBUMIN,  in the last 8760 hours No results found for this basename: LIPASE, AMYLASE,  in the last 8760 hours No results found for this basename: AMMONIA,  in the last 8760 hours CBC:  Recent Labs  08/14/13 1500 11/03/13 1422 01/30/14  WBC 7.1 8.5 6.0  NEUTROABS 4.3 5.5  --   HGB 13.0 12.4 11.8*  HCT 38.4 36.4 36  MCV 97.3 96.6  --   PLT 201.0 280 242   TSH: No results found for this basename: TSH,  in the last 8760 hours A1C: Lab Results  Component Value Date   HGBA1C 5.5 12/20/2012   Lipid Panel: No results found for this basename: CHOL, HDL, LDLCALC, TRIG, CHOLHDL, LDLDIRECT,  in the last 8760  hours   Assessment/Plan Bronchitis Doxycyline 100 mg BID for 7 days mucinex DM bid for 10 days florastore BID for 10 days  Increase hydration

## 2014-06-11 ENCOUNTER — Other Ambulatory Visit: Payer: Self-pay | Admitting: Internal Medicine

## 2014-06-11 DIAGNOSIS — R1310 Dysphagia, unspecified: Secondary | ICD-10-CM

## 2014-06-14 ENCOUNTER — Ambulatory Visit (HOSPITAL_COMMUNITY)
Admission: RE | Admit: 2014-06-14 | Discharge: 2014-06-14 | Disposition: A | Discharge status: Home / Self Care | Payer: Medicare PPO | Source: Ambulatory Visit | Attending: Internal Medicine | Admitting: Internal Medicine

## 2014-06-14 DIAGNOSIS — R1310 Dysphagia, unspecified: Secondary | ICD-10-CM

## 2014-06-14 DIAGNOSIS — K224 Dyskinesia of esophagus: Secondary | ICD-10-CM | POA: Diagnosis not present

## 2014-06-18 ENCOUNTER — Non-Acute Institutional Stay: Payer: Medicare PPO | Admitting: Internal Medicine

## 2014-06-18 ENCOUNTER — Encounter: Payer: Self-pay | Admitting: Internal Medicine

## 2014-06-18 VITALS — BP 118/70 | HR 60 | Temp 97.5°F | Wt 111.0 lb

## 2014-06-18 DIAGNOSIS — R1310 Dysphagia, unspecified: Secondary | ICD-10-CM

## 2014-06-18 DIAGNOSIS — R1314 Dysphagia, pharyngoesophageal phase: Secondary | ICD-10-CM

## 2014-06-18 DIAGNOSIS — R197 Diarrhea, unspecified: Secondary | ICD-10-CM | POA: Insufficient documentation

## 2014-06-18 DIAGNOSIS — R1319 Other dysphagia: Secondary | ICD-10-CM | POA: Insufficient documentation

## 2014-06-18 NOTE — Progress Notes (Signed)
Patient ID: Barbara [name], female   DOB: 05/17/1929, 80 y.o.   MRN: [id number]    [place] Room Number: [id number]  Place of Service: Clinic (12)    Allergies  Allergen Reactions  . Aricept [Donepezil Hcl]     Muscle pain    Chief Complaint  Patient presents with  . Medical Management of Chronic Issues    A-Fib, dementia, with daughter [name]. Yesterday had trouble with controling bowels.  Aid said that several people in [place] is having same problem    HPI:  Esophageal dysphagia: has been having a chronic cough. Barium swallow showed sluggish esophageal dysmotility. Speech therapist is working with her. Patient thinks she is better.  Diarrhea: started acutely yesterday and she thinks she is better today. Other residents on [place] reportedly have a similar issue. No nausea or abd pain. No blood in stool.    Medications: Patient's Medications  New Prescriptions   No medications on file  Previous Medications   ACETAMINOPHEN (TYLENOL) 325 MG TABLET    Take 650 mg by mouth 2 (two) times daily.   ATORVASTATIN (LIPITOR) 10 MG TABLET    Take one tablet daily   CALCIUM CARBONATE-VITAMIN D (CALTRATE 600+D) 600-400 MG-UNIT PER CHEW TABLET    Chew 1 tablet by mouth daily.   CHOLECALCIFEROL (VITAMIN D) 1000 UNITS TABLET    Take 1,000 Units by mouth daily.   DILTIAZEM CD 240 MG 24 HR CAPSULE    Take one tablet daily   DORZOLAMIDE-TIMOLOL (COSOPT) 22.3-6.8 MG/ML OPHTHALMIC SOLUTION    Place 1 drop into both eyes 2 (two) times daily.   ELIQUIS 2.5 MG TABS TABLET    Take 2.5 mg by mouth 2 (two) times daily.    LEVOTHYROXINE (SYNTHROID, LEVOTHROID) 75 MCG TABLET    Take 75 mcg by mouth daily before breakfast.   MULTIPLE VITAMINS-MINERALS (PRESERVISION/LUTEIN) CAPS    Take 2 capsules by mouth daily.   POLYETHYLENE GLYCOL (MIRALAX / GLYCOLAX) PACKET    Take 17 g by mouth daily. Mix with 6oz beverage of choice. Hold for loose stool   SERTRALINE (ZOLOFT) 50 MG  TABLET    Take 50 mg by mouth at bedtime.   TERIPARATIDE, RECOMBINANT, (FORTEO) 600 MCG/2.4ML SOLN    Inject 20 mcg into the skin every morning.   TRAVOPROST, BAK FREE, (TRAVATAN Z) 0.004 % SOLN OPHTHALMIC SOLUTION    1 drop at bedtime.  Modified Medications   No medications on file  Discontinued Medications   CARBOXYMETHYLCELLULOSE (REFRESH PLUS) 0.5 % SOLN    Place 1 drop into both eyes every 4 (four) hours as needed.    MENTHOL, TOPICAL ANALGESIC, (BENGAY COLD THERAPY) 5 % GEL    Apply 1 application topically 4 (four) times daily. Apply to lower back and/or bilateral hips with pain up to 4 times daily.   SENNOSIDES-DOCUSATE SODIUM (SENOKOT-S) 8.6-50 MG TABLET    Take 1 tablet by mouth at bedtime as needed for constipation.      Review of Systems  Constitutional: Positive for activity change. Negative for fever, chills, diaphoresis, appetite change and fatigue.  HENT: Positive for hearing loss.        Right facial weakness  Eyes:       Wears prescription lenses  Respiratory: Positive for cough. Negative for chest tightness, shortness of breath, wheezing and stridor.   Cardiovascular: Negative for chest pain, palpitations and leg swelling.        History chronic atrial fibrillation  Gastrointestinal:  Negative for abdominal pain, diarrhea, constipation and abdominal distention.  Endocrine: Negative.   Genitourinary:       Urgency and incontinence  Musculoskeletal:       Right-sided weakness.  Allergic/Immunologic: Negative.   Neurological: Positive for dizziness.       History of right facial weakness and right hemiparesis. Dementia.  Psychiatric/Behavioral:       Chronic dementia    Filed Vitals:   06/18/14 1605  BP: 118/70  Pulse: 60  Temp: 97.5 F (36.4 C)  TempSrc: Oral  Weight: 111 lb (50.349 kg)   Body mass index is 20.98 kg/(m^2).  Physical Exam  Constitutional: She appears well-nourished. No distress.  Frail elderly female  HENT:  Head: Normocephalic and  atraumatic.  Right Ear: External ear normal.  Nose: Nose normal.  Eyes: EOM are normal. Pupils are equal, round, and reactive to light.  Neck: Normal range of motion. Neck supple. No JVD present. No tracheal deviation present. No thyromegaly present.  Cardiovascular: Normal rate.  An irregular rhythm present. Exam reveals no gallop and no friction rub.   Murmur heard. HxAtrial fibrillation;   Pulmonary/Chest: Effort normal. No respiratory distress. She has no wheezes. She has no rales.  Abdominal: Soft. Bowel sounds are normal. She exhibits no distension and no mass. There is no tenderness.  Musculoskeletal: She exhibits no edema or tenderness.  Lymphadenopathy:    She has no cervical adenopathy.  Neurological: No cranial nerve deficit. Coordination normal.  Loss of memory  Skin: Skin is warm and dry. She is not diaphoretic.  Psychiatric: She has a normal mood and affect. Her behavior is normal.     Labs reviewed: No visits with results within 3 Month(s) from this visit. Latest known visit with results is:  Nursing Home on 02/26/2014  Component Date Value Ref Range Status  . Hemoglobin 01/30/2014 11.8* 12.0 - 16.0 g/dL Final  . HCT 01/30/2014 36  36 - 46 % Final  . Platelets 01/30/2014 242  150 - 399 K/L Final  . WBC 01/30/2014 6.0   Final     Assessment/Plan  Esophageal dysphagia: continue with ST. Diet upgraded to thin liquis, which she appears to tolerate.  Diarrhea: acute illness that seems resolved.

## 2014-06-21 LAB — BASIC METABOLIC PANEL
BUN: 24 mg/dL — AB (ref 4–21)
CREATININE: 0.8 mg/dL (ref 0.5–1.1)
GLUCOSE: 131 mg/dL
POTASSIUM: 3.8 mmol/L (ref 3.4–5.3)
Potassium: 12.6 mmol/L — AB (ref 3.4–5.3)
Sodium: 136 mmol/L — AB (ref 137–147)

## 2014-06-21 LAB — HEPATIC FUNCTION PANEL
ALT: 9 U/L (ref 7–35)
AST: 21 U/L (ref 13–35)
Alkaline Phosphatase: 70 U/L (ref 25–125)
BILIRUBIN, TOTAL: 1.1 mg/dL

## 2014-06-21 LAB — CBC AND DIFFERENTIAL
HEMATOCRIT: 36 % (ref 36–46)
Hemoglobin: 12.6 g/dL (ref 12.0–16.0)
PLATELETS: 212 10*3/uL (ref 150–399)
WBC: 12.8 10^3/mL

## 2014-06-25 ENCOUNTER — Encounter: Payer: Self-pay | Admitting: Adult Health

## 2014-06-25 ENCOUNTER — Encounter: Payer: Self-pay | Admitting: Internal Medicine

## 2014-06-25 ENCOUNTER — Non-Acute Institutional Stay: Payer: Medicare PPO | Admitting: Adult Health

## 2014-06-25 DIAGNOSIS — K219 Gastro-esophageal reflux disease without esophagitis: Secondary | ICD-10-CM

## 2014-06-25 DIAGNOSIS — R1314 Dysphagia, pharyngoesophageal phase: Secondary | ICD-10-CM

## 2014-06-25 DIAGNOSIS — R1319 Other dysphagia: Secondary | ICD-10-CM

## 2014-06-25 DIAGNOSIS — R1310 Dysphagia, unspecified: Secondary | ICD-10-CM

## 2014-06-25 DIAGNOSIS — F0390 Unspecified dementia without behavioral disturbance: Secondary | ICD-10-CM

## 2014-06-25 NOTE — Assessment & Plan Note (Signed)
She clears her throat during our visit and reportedly does this during and after meals as well. Will try Prilosec 20mg  BID for 4 weeks, then QD. Continue working with ST as well.

## 2014-06-25 NOTE — Assessment & Plan Note (Signed)
Her mental state has returned to her baseline dementia according to the staff. She has an allergy to Aricept. Continue to monitor and consider Namenda in the future.

## 2014-06-25 NOTE — Assessment & Plan Note (Addendum)
Return to NTL for a one week trial per ST. Monitor closely for s/s of pneumonia. She has many risk factors for aspiration pneumonia including a hx of CVA and cervical scoliosis.

## 2014-06-25 NOTE — Progress Notes (Signed)
Patient ID: Barbara [name], female   DOB: 1929-04-07, 80 y.o.   MRN: [id number]  Nursing Home Location:  [place] of Service: ALF [phone])  Chief Complaint  Patient presents with  . Acute Visit    dysphagia    HPI:  This is an 80 y.o. Female living in [place] at [facility]. I was asked to see her today to re evaluate her swallowing fxn, as well as her labs. She apparently had a period of confusion last week and was started on Cipro for a possible UTI. Her urine returned no growth. Her CBC revealed a slightly elevated WBC of 12.8. She is no longer confused but back to her baseline memory loss. She has been afebrile with no other outward signs of infection. Her BNP returned last week at 522. She has no previous lab to compare to and has no outward signs of CHF. She denied CP, SOB, edema, PND, etc.    The staff has reported that she continues to have difficulty swallowing during meals and has trouble clearing her throat frequently. She did report a cough and a runny nose but can not tell me how long this has gone on.  The speech therapist would like to try one week of NTL to see if her symptoms improve. A barium swallow was performed on 06/14/14 that showed decreased esophageal motility. Her last xray in Oct of 2015 returned normal.    Review of Systems:  Review of Systems  Constitutional: Negative for fever, chills, diaphoresis, activity change, appetite change, fatigue and unexpected weight change.  HENT: Positive for congestion, postnasal drip, rhinorrhea, sore throat and trouble swallowing. Negative for ear discharge, ear pain, sinus pressure, sneezing, tinnitus and voice change.   Eyes: Negative.   Respiratory: Positive for cough. Negative for choking, chest tightness, shortness of breath, wheezing and stridor.   Cardiovascular: Negative for chest pain, palpitations and leg swelling.  Gastrointestinal: Negative.  Negative for abdominal  pain, diarrhea, constipation and abdominal distention.  Endocrine: Negative.   Genitourinary: Negative for dysuria and difficulty urinating.  Musculoskeletal: Positive for neck pain. Negative for myalgias and gait problem.  Skin: Negative.  Negative for pallor and rash.  Allergic/Immunologic: Positive for environmental allergies. Negative for food allergies.  Neurological: Negative for dizziness, tremors, seizures, syncope, facial asymmetry, speech difficulty, light-headedness, numbness and headaches.  Hematological: Negative.   Psychiatric/Behavioral: Positive for confusion. Negative for agitation.    Medications: Patient's Medications  New Prescriptions   No medications on file  Previous Medications   ACETAMINOPHEN (TYLENOL) 325 MG TABLET    Take 650 mg by mouth 2 (two) times daily.   ATORVASTATIN (LIPITOR) 10 MG TABLET    Take one tablet daily   CALCIUM CARBONATE-VITAMIN D (CALTRATE 600+D) 600-400 MG-UNIT PER CHEW TABLET    Chew 1 tablet by mouth daily.   CHOLECALCIFEROL (VITAMIN D) 1000 UNITS TABLET    Take 1,000 Units by mouth daily.   DILTIAZEM CD 240 MG 24 HR CAPSULE    Take one tablet daily   DORZOLAMIDE-TIMOLOL (COSOPT) 22.3-6.8 MG/ML OPHTHALMIC SOLUTION    Place 1 drop into both eyes 2 (two) times daily.   ELIQUIS 2.5 MG TABS TABLET    Take 2.5 mg by mouth 2 (two) times daily.    LEVOTHYROXINE (SYNTHROID, LEVOTHROID) 75 MCG TABLET    Take 75 mcg by mouth daily before breakfast.   MULTIPLE VITAMINS-MINERALS (PRESERVISION/LUTEIN) CAPS    Take 2 capsules by mouth daily.  POLYETHYLENE GLYCOL (MIRALAX / GLYCOLAX) PACKET    Take 17 g by mouth daily. Mix with 6oz beverage of choice. Hold for loose stool   SERTRALINE (ZOLOFT) 50 MG TABLET    Take 50 mg by mouth at bedtime.   TERIPARATIDE, RECOMBINANT, (FORTEO) 600 MCG/2.4ML SOLN    Inject 20 mcg into the skin every morning.   TRAVOPROST, BAK FREE, (TRAVATAN Z) 0.004 % SOLN OPHTHALMIC SOLUTION    1 drop at bedtime.  Modified  Medications   No medications on file  Discontinued Medications   No medications on file     Physical Exam:  Filed Vitals:   06/25/14 1559  BP: 113/74  Pulse: 72  Temp: 97.6 F (36.4 C)  Resp: 17  SpO2: 97%   Physical Exam  Constitutional: No distress.  Frail, short stature  HENT:  Head: Normocephalic and atraumatic.  Right Ear: External ear normal.  Left Ear: External ear normal.  Mouth/Throat: Oropharyngeal exudate present.  Clear drainage to nares with some erythema noted  Eyes: Conjunctivae and EOM are normal. Pupils are equal, round, and reactive to light.  Neck: No JVD present. No thyromegaly present.  Decreased ROM to neck  Cardiovascular: Normal rate and normal heart sounds.   No murmur heard. Irregular rhythm  Pulmonary/Chest: Effort normal and breath sounds normal. No stridor. No respiratory distress.  Abdominal: Soft. Bowel sounds are normal.  Musculoskeletal: Normal range of motion.  Lymphadenopathy:    She has no cervical adenopathy.  Neurological: She is alert. No cranial nerve deficit. Gait normal.  Oriented to self and place, but forgetful on the details of her care and the date.  Skin: Skin is warm and dry. She is not diaphoretic.  Psychiatric: Affect normal.      Labs reviewed/Significant Diagnostic Results:  Basic Metabolic Panel:  Recent Labs  08/14/13 1500 11/03/13 1422 06/21/14  NA 139 138 136*  K 3.6 4.4 12.6*  CL 106 103  --   CO2 26 21  --   GLUCOSE 104* 87  --   BUN 21 17 24*  CREATININE 0.9 0.70 0.8  CALCIUM 9.0 9.1  --    CBC:  Recent Labs  08/14/13 1500 11/03/13 1422 01/30/14 06/21/14  WBC 7.1 8.5 6.0 12.8  NEUTROABS 4.3 5.5  --   --   HGB 13.0 12.4 11.8* 12.6  HCT 38.4 36.4 36 36  MCV 97.3 96.6  --   --   PLT 201.0 280 242 212   CBG: No results for input(s): GLUCAP in the last 8760 hours. TSH: No results for input(s): TSH in the last 8760 hours. A1C: Lab Results  Component Value Date   HGBA1C 5.5  12/20/2012   06/21/14: BNP 522.6   Assessment/Plan Esophageal dysphagia Return to NTL for a one week trial per ST. Monitor closely for s/s of pneumonia. She has many risk factors for aspiration pneumonia including a hx of CVA and cervical scoliosis.   GERD (gastroesophageal reflux disease) She clears her throat during our visit and reportedly does this during and after meals as well. Will try Prilosec 20mg  BID for 4 weeks, then QD. Continue working with ST as well.   Senile dementia, uncomplicated Her mental state has returned to her baseline dementia according to the staff. She has an allergy to Aricept. Continue to monitor and consider Namenda in the future.    D/C Cipro secondary for to urine culture showing negative growth. Her lungs are clear and she is afebrile, will continue to monitor  for signs of aspiration pneumonia.    Labs/tests ordered  F/U CBC, BNP, TSH  Cindi Carbon, [place] [phone]

## 2014-06-26 LAB — CBC AND DIFFERENTIAL
HCT: 37 % (ref 36–46)
HEMOGLOBIN: 12.3 g/dL (ref 12.0–16.0)
PLATELETS: 242 10*3/uL (ref 150–399)
WBC: 6.4 10*3/mL

## 2014-06-26 LAB — TSH: TSH: 6.26 u[IU]/mL — AB (ref <=5.90)

## 2014-07-24 ENCOUNTER — Encounter: Payer: Self-pay | Admitting: Adult Health

## 2014-07-24 ENCOUNTER — Non-Acute Institutional Stay: Payer: Medicare PPO | Admitting: Adult Health

## 2014-07-24 DIAGNOSIS — M810 Age-related osteoporosis without current pathological fracture: Secondary | ICD-10-CM

## 2014-07-24 DIAGNOSIS — K219 Gastro-esophageal reflux disease without esophagitis: Secondary | ICD-10-CM

## 2014-07-24 DIAGNOSIS — I638 Other cerebral infarction: Secondary | ICD-10-CM

## 2014-07-24 DIAGNOSIS — R1310 Dysphagia, unspecified: Secondary | ICD-10-CM

## 2014-07-24 DIAGNOSIS — J3089 Other allergic rhinitis: Secondary | ICD-10-CM

## 2014-07-24 DIAGNOSIS — I482 Chronic atrial fibrillation, unspecified: Secondary | ICD-10-CM

## 2014-07-24 DIAGNOSIS — R1319 Other dysphagia: Secondary | ICD-10-CM

## 2014-07-24 DIAGNOSIS — F0390 Unspecified dementia without behavioral disturbance: Secondary | ICD-10-CM

## 2014-07-24 DIAGNOSIS — R1314 Dysphagia, pharyngoesophageal phase: Secondary | ICD-10-CM

## 2014-07-24 DIAGNOSIS — J309 Allergic rhinitis, unspecified: Secondary | ICD-10-CM | POA: Insufficient documentation

## 2014-07-24 DIAGNOSIS — I6389 Other cerebral infarction: Secondary | ICD-10-CM

## 2014-07-24 NOTE — Assessment & Plan Note (Signed)
Rate variable 68-100. Asymptomatic. Currently on Eliquis, Hgb stable. Continue current meds and monitor.

## 2014-07-24 NOTE — Assessment & Plan Note (Signed)
Tolerating NTL well. Continue current diet and ST

## 2014-07-24 NOTE — Assessment & Plan Note (Signed)
No new events. On Eliquis for prevent of CVA, also on a statin. Continue to monitor.

## 2014-07-24 NOTE — Assessment & Plan Note (Signed)
No reports of indigestion, sputum production better but still present. Continue current dose and monitor.

## 2014-07-24 NOTE — Assessment & Plan Note (Signed)
Check lipids at next lab day

## 2014-07-24 NOTE — Assessment & Plan Note (Signed)
H/O pelvic fx in June. Currently on Forteo, Calcium, and Vit D. Check Vit D level next lab day.  Continue current forteo dose for at least 1 year. Started in March.

## 2014-07-24 NOTE — Assessment & Plan Note (Signed)
Continue current dose of Singulair, some improvement noted.

## 2014-07-24 NOTE — Assessment & Plan Note (Signed)
MMSE 20/30 on 09/20/13, failed clock. Did not tolerate Aricept in the past. Has functional capability and would probably do well with Namenda. She is adjusting to new meds and a new environment. Will consider at the next visit.

## 2014-07-24 NOTE — Progress Notes (Signed)
Patient ID: Barbara [name], female   DOB: [birth year], 80 y.o.   MRN: [id number]    Patient ID: Barbara [name], female   DOB: Jan 27, 1929, 80 y.o.   MRN: [id number]  Nursing Home Location:  [place] of Service: ALF [phone])  Chief Complaint  Patient presents with  . Acute Visit    f/u starting singulair, move to memory care    HPI:  This is an 80 y.o. Female living in [place] at [facility]. I am here to evaluate her after being started on singulair for possible allergic rhinitis and chronic phlegm. The staff reports that this has improved but during our visit she coughed up white phlegm twice. She has dysphagia and is on a NTL.   A barium swallow was performed on 06/14/14 that showed decreased esophageal motility. Her last xray in Nov of 2015 returned normal. She overall has a poor appetite and her weight is currently 109lbs which has not changed considerably in the past two months.  She recently moved to memory care after residing in [place]. This has been a big change for her. She reports missing her social life and friends. She reports that she is sleeping well and is not anxious or depressed.  The staff reports that she is confused about where her room is and her schedule but is redirected fairly easily. She has poor vision and this certainly affects her orientation.    Review of Systems:  Review of Systems  Constitutional: Negative for fever, chills, diaphoresis, activity change, appetite change, fatigue and unexpected weight change.  HENT: Positive for postnasal drip, rhinorrhea and trouble swallowing. Negative for congestion, ear discharge, ear pain, sinus pressure, sneezing, sore throat, tinnitus and voice change.   Eyes: Negative.   Respiratory: Negative for cough, choking, chest tightness, shortness of breath, wheezing and stridor.   Cardiovascular: Negative for chest pain, palpitations and leg swelling.  Gastrointestinal: Negative.   Negative for abdominal pain, diarrhea, constipation and abdominal distention.  Endocrine: Negative.   Genitourinary: Negative for dysuria and difficulty urinating.       Mostly continent  Musculoskeletal: Negative for myalgias, gait problem and neck pain.       Uses walker  Skin: Negative.  Negative for pallor and rash.  Allergic/Immunologic: Positive for environmental allergies. Negative for food allergies.  Neurological: Negative for dizziness, tremors, seizures, syncope, facial asymmetry, speech difficulty, light-headedness, numbness and headaches.  Hematological: Negative.   Psychiatric/Behavioral: Positive for confusion. Negative for agitation. The patient is nervous/anxious.   09/24/13 MMSE 20/30, failed clock  Medications: Patient's Medications  New Prescriptions   No medications on file  Previous Medications   ACETAMINOPHEN (TYLENOL) 325 MG TABLET    Take 650 mg by mouth 2 (two) times daily.   ALBUTEROL (ACCUNEB) 1.25 MG/3ML NEBULIZER SOLUTION    Take 1 ampule by nebulization every 6 (six) hours as needed for wheezing.   ATORVASTATIN (LIPITOR) 10 MG TABLET    Take one tablet daily   CALCIUM CARBONATE (OS-CAL) 600 MG TABS TABLET    Take 600 mg by mouth daily with breakfast.   CHOLECALCIFEROL (VITAMIN D) 1000 UNITS TABLET    Take 1,000 Units by mouth daily.   DILTIAZEM CD 240 MG 24 HR CAPSULE    Take one tablet daily   DORZOLAMIDE-TIMOLOL (COSOPT) 22.3-6.8 MG/ML OPHTHALMIC SOLUTION    Place 1 drop into both eyes 2 (two) times daily.   DULOXETINE (CYMBALTA) 30 MG CAPSULE    Take 30 mg  by mouth daily.   ELIQUIS 2.5 MG TABS TABLET    Take 2.5 mg by mouth 2 (two) times daily.    LEVOTHYROXINE (SYNTHROID, LEVOTHROID) 100 MCG TABLET    Take 100 mcg by mouth daily before breakfast.   MONTELUKAST (SINGULAIR) 10 MG TABLET    Take 10 mg by mouth at bedtime.   MULTIPLE VITAMINS-MINERALS (PRESERVISION/LUTEIN) CAPS    Take 2 capsules by mouth daily.   OMEPRAZOLE (PRILOSEC) 20 MG CAPSULE    Take  20 mg by mouth 2 (two) times daily before a meal.   POLYETHYLENE GLYCOL (MIRALAX / GLYCOLAX) PACKET    Take 17 g by mouth daily. Mix with 6oz beverage of choice. Hold for loose stool   TERIPARATIDE, RECOMBINANT, (FORTEO) 600 MCG/2.4ML SOLN    Inject 20 mcg into the skin every morning.   TRAVOPROST, BAK FREE, (TRAVATAN Z) 0.004 % SOLN OPHTHALMIC SOLUTION    1 drop at bedtime.  Modified Medications   No medications on file  Discontinued Medications   CALCIUM CARBONATE-VITAMIN D (CALTRATE 600+D) 600-400 MG-UNIT PER CHEW TABLET    Chew 1 tablet by mouth daily.   LEVOTHYROXINE (SYNTHROID, LEVOTHROID) 75 MCG TABLET    Take 75 mcg by mouth daily before breakfast.   SERTRALINE (ZOLOFT) 50 MG TABLET    Take 50 mg by mouth at bedtime.     Physical Exam:  Filed Vitals:   07/24/14 0955  BP: 120/70  Pulse: 100  Temp: 97.1 F (36.2 C)  Resp: 20  Weight: 109 lb (49.442 kg)  SpO2: 95%   Physical Exam  Constitutional: No distress.  Frail, short stature  HENT:  Head: Normocephalic and atraumatic.  Right Ear: External ear normal.  Left Ear: External ear normal.  Mouth/Throat: Oropharyngeal exudate present.  Clear drainage to nares with some erythema noted  Eyes: Conjunctivae and EOM are normal. Pupils are equal, round, and reactive to light.  Neck: No JVD present. No thyromegaly present.  Decreased ROM to neck  Cardiovascular: Normal rate and normal heart sounds.   No murmur heard. Irregular rhythm  Pulmonary/Chest: Effort normal and breath sounds normal. No stridor. No respiratory distress.  Abdominal: Soft. Bowel sounds are normal.  Musculoskeletal: Normal range of motion.  kyphosis  Lymphadenopathy:    She has no cervical adenopathy.  Neurological: She is alert. No cranial nerve deficit. Gait normal.  Oriented to self and place, but forgetful on the details of her care and the date.  Skin: Skin is warm and dry. She is not diaphoretic.  Psychiatric: Affect normal.      Labs  reviewed/Significant Diagnostic Results:  PCXR 06/28/14: cardiomegaly without evidence of CHF, negative for pna  06/14/14:Barium swallow was performed on 06/14/14 that showed decreased esophageal motility.  01/16/14: Capsule endoscopy: normal study, no GI source of anemia or intermittent heme pos stool  Basic Metabolic Panel:  Recent Labs  08/14/13 1500 11/03/13 1422 06/21/14  NA 139 138 136*  K 3.6 4.4 3.8  12.6*  CL 106 103  --   CO2 26 21  --   GLUCOSE 104* 87  --   BUN 21 17 24*  CREATININE 0.9 0.70 0.8  CALCIUM 9.0 9.1  --    CBC:  Recent Labs  08/14/13 1500 11/03/13 1422 01/30/14 06/21/14 06/26/14  WBC 7.1 8.5 6.0 12.8 6.4  NEUTROABS 4.3 5.5  --   --   --   HGB 13.0 12.4 11.8* 12.6 12.3  HCT 38.4 36.4 36 36 37  MCV 97.3  96.6  --   --   --   PLT 201.0 280 242 212 242   CBG: No results for input(s): GLUCAP in the last 8760 hours. TSH:  Recent Labs  06/26/14  TSH 6.26*   A1C: Lab Results  Component Value Date   HGBA1C 5.5 12/20/2012   06/21/14: BNP 522.6 Lab Results  Component Value Date   CHOL 177 12/20/2012   HDL 51 12/20/2012   LDLCALC 104* 12/20/2012   TRIG 111 12/20/2012   CHOLHDL 3.5 12/20/2012    Assessment/Plan  Atrial fibrillation Rate variable 68-100. Asymptomatic. Currently on Eliquis, Hgb stable. Continue current meds and monitor.  GERD (gastroesophageal reflux disease) No reports of indigestion, sputum production better but still present. Continue current dose and monitor.   CVA (cerebral infarction) No new events. On Eliquis for prevent of CVA, also on a statin. Continue to monitor.  Senile osteoporosis H/O pelvic fx in June. Currently on Forteo, Calcium, and Vit D. Check Vit D level next lab day.  Continue current forteo dose for at least 1 year. Started in March.   Senile dementia, uncomplicated MMSE 13/24 on 09/20/13, failed clock. Did not tolerate Aricept in the past. Has functional capability and would probably do well with  Namenda. She is adjusting to new meds and a new environment. Will consider at the next visit.   Hyperlipidemia Check lipids at next lab day  Esophageal dysphagia Tolerating NTL well. Continue current diet and ST     Cindi Carbon, ANP [facility] [phone]

## 2014-07-26 LAB — HEPATIC FUNCTION PANEL
ALK PHOS: 76 U/L (ref 25–125)
ALT: 8 U/L (ref 7–35)
AST: 18 U/L (ref 13–35)
Bilirubin, Direct: 0.1 mg/dL (ref 0.01–0.4)
Bilirubin, Total: 0.6 mg/dL

## 2014-07-26 LAB — LIPID PANEL
Cholesterol: 120 mg/dL (ref 0–200)
HDL: 40 mg/dL (ref 35–70)
LDL CALC: 63 mg/dL
Triglycerides: 85 mg/dL (ref 40–160)

## 2014-08-07 ENCOUNTER — Non-Acute Institutional Stay: Payer: Medicare PPO | Admitting: Adult Health

## 2014-08-07 DIAGNOSIS — F0391 Unspecified dementia with behavioral disturbance: Secondary | ICD-10-CM

## 2014-08-07 DIAGNOSIS — F03918 Unspecified dementia, unspecified severity, with other behavioral disturbance: Secondary | ICD-10-CM | POA: Insufficient documentation

## 2014-08-07 DIAGNOSIS — J3089 Other allergic rhinitis: Secondary | ICD-10-CM

## 2014-08-07 NOTE — Assessment & Plan Note (Signed)
Worsening with periods of agitation and worsening confusion which is ongoing. Increase Cymbalta to 60mg  daily and add Ativan 0.25mg  q6 prn agitation. I spoke with her daughter and she is in agreement with the above plan

## 2014-08-07 NOTE — Assessment & Plan Note (Signed)
Improved per the residents daughter. She has congestion in the morning but it improves as the day goes on, where as before the symptoms lasted all day.

## 2014-08-07 NOTE — Progress Notes (Signed)
Patient ID: Barbara [name], female   DOB: Dec 10, 1928, 80 y.o.   MRN: [id number]    Patient ID: Barbara [name], female   DOB: June 14, 1929, 80 y.o.   MRN: [id number]  Nursing Home Location:  [place]: SNF (31)  Chief Complaint  Patient presents with  . Acute Visit    agitation    HPI:  This is an 80 y.o. Female living in [place] at [facility]. I am here to see her at request of the daughter, [name], because apparently she has been more agitated and confused. This has been an ongoing issue as she has a hx of dementia but there has been more anxiety and agitated over the weekend. She has not had a fever or dysuria.  She is on singulair for possible allergic rhinitis and chronic phlegm. The staff reports that this has improved but during our visit she coughed up white phlegm twice. She has dysphagia and is on a NTL.   A barium swallow was performed on 06/14/14 that showed decreased esophageal motility. Her last xray in Nov of 2015 returned normal. She overall has a poor appetite and her weight is currently 109lbs which has not changed considerably in the past two months.  She recently moved to memory care after residing in [place]. There is concern for depression with this change.   Review of Systems:  Review of Systems  Constitutional: Negative for fever, chills, diaphoresis, activity change, appetite change, fatigue and unexpected weight change.  HENT: Positive for postnasal drip, rhinorrhea and trouble swallowing. Negative for congestion, ear discharge, ear pain, sinus pressure, sneezing, sore throat, tinnitus and voice change.   Eyes: Negative.   Respiratory: Negative for cough, choking, chest tightness, shortness of breath, wheezing and stridor.   Cardiovascular: Negative for chest pain, palpitations and leg swelling.  Gastrointestinal: Negative.  Negative for abdominal pain, diarrhea, constipation and abdominal distention.    Endocrine: Negative.   Genitourinary: Negative for dysuria and difficulty urinating.       Mostly continent  Musculoskeletal: Negative for myalgias, gait problem and neck pain.       Uses walker  Skin: Negative.  Negative for pallor and rash.  Allergic/Immunologic: Positive for environmental allergies. Negative for food allergies.  Neurological: Negative for dizziness, tremors, seizures, syncope, facial asymmetry, speech difficulty, light-headedness, numbness and headaches.  Hematological: Negative.   Psychiatric/Behavioral: Positive for confusion. Negative for agitation. The patient is nervous/anxious.   09/24/13 MMSE 20/30, failed clock  Medications: Patient's Medications  New Prescriptions   No medications on file  Previous Medications   ACETAMINOPHEN (TYLENOL) 325 MG TABLET    Take 650 mg by mouth 2 (two) times daily.   ALBUTEROL (ACCUNEB) 1.25 MG/3ML NEBULIZER SOLUTION    Take 1 ampule by nebulization every 6 (six) hours as needed for wheezing.   ATORVASTATIN (LIPITOR) 10 MG TABLET    Take one tablet daily   CALCIUM CARBONATE (OS-CAL) 600 MG TABS TABLET    Take 600 mg by mouth daily with breakfast.   CHOLECALCIFEROL (VITAMIN D) 1000 UNITS TABLET    Take 1,000 Units by mouth daily.   DILTIAZEM CD 240 MG 24 HR CAPSULE    Take one tablet daily   DORZOLAMIDE-TIMOLOL (COSOPT) 22.3-6.8 MG/ML OPHTHALMIC SOLUTION    Place 1 drop into both eyes 2 (two) times daily.   DULOXETINE (CYMBALTA) 60 MG CAPSULE    Take 60 mg by mouth daily.   ELIQUIS 2.5 MG TABS TABLET  Take 2.5 mg by mouth 2 (two) times daily.    LEVOTHYROXINE (SYNTHROID, LEVOTHROID) 100 MCG TABLET    Take 100 mcg by mouth daily before breakfast.   LORAZEPAM (ATIVAN) 0.5 MG TABLET    Take 0.25 mg by mouth every 6 (six) hours as needed for anxiety.   MONTELUKAST (SINGULAIR) 10 MG TABLET    Take 10 mg by mouth at bedtime.   MULTIPLE VITAMINS-MINERALS (PRESERVISION/LUTEIN) CAPS    Take 2 capsules by mouth daily.   OMEPRAZOLE  (PRILOSEC) 20 MG CAPSULE    Take 20 mg by mouth 2 (two) times daily before a meal.   POLYETHYLENE GLYCOL (MIRALAX / GLYCOLAX) PACKET    Take 17 g by mouth daily. Mix with 6oz beverage of choice. Hold for loose stool   TERIPARATIDE, RECOMBINANT, (FORTEO) 600 MCG/2.4ML SOLN    Inject 20 mcg into the skin every morning.   TRAVOPROST, BAK FREE, (TRAVATAN Z) 0.004 % SOLN OPHTHALMIC SOLUTION    1 drop at bedtime.  Modified Medications   No medications on file  Discontinued Medications   DULOXETINE (CYMBALTA) 30 MG CAPSULE    Take 30 mg by mouth daily.     Physical Exam:  Filed Vitals:   08/07/14 1020  BP: 153/73  Pulse: 84  Temp: 97.7 F (36.5 C)  Resp: 16  Weight: 109 lb (49.442 kg)  SpO2: 96%   Physical Exam  Constitutional: No distress.  Frail, short stature  HENT:  Head: Normocephalic and atraumatic.  Right Ear: External ear normal.  Left Ear: External ear normal.  Mouth/Throat: Oropharyngeal exudate present.  Clear drainage to nares with some erythema noted  Eyes: Conjunctivae and EOM are normal. Pupils are equal, round, and reactive to light.  Neck: No JVD present. No thyromegaly present.  Decreased ROM to neck  Cardiovascular: Normal rate and normal heart sounds.   No murmur heard. Irregular rhythm  Pulmonary/Chest: Effort normal and breath sounds normal. No stridor. No respiratory distress.  Abdominal: Soft. Bowel sounds are normal.  Musculoskeletal: Normal range of motion.  kyphosis  Lymphadenopathy:    She has no cervical adenopathy.  Neurological: She is alert. No cranial nerve deficit. Gait normal.  Oriented to self and place, but forgetful on the details of her care and the date.  Skin: Skin is warm and dry. She is not diaphoretic.  Psychiatric: Affect normal.      Labs reviewed/Significant Diagnostic Results:  PCXR 06/28/14: cardiomegaly without evidence of CHF, negative for pna  06/14/14:Barium swallow was performed on 06/14/14 that showed decreased  esophageal motility.  01/16/14: Capsule endoscopy: normal study, no GI source of anemia or intermittent heme pos stool  Basic Metabolic Panel:  Recent Labs  08/14/13 1500 11/03/13 1422 06/21/14  NA 139 138 136*  K 3.6 4.4 3.8  12.6*  CL 106 103  --   CO2 26 21  --   GLUCOSE 104* 87  --   BUN 21 17 24*  CREATININE 0.9 0.70 0.8  CALCIUM 9.0 9.1  --    CBC:  Recent Labs  08/14/13 1500 11/03/13 1422 01/30/14 06/21/14 06/26/14  WBC 7.1 8.5 6.0 12.8 6.4  NEUTROABS 4.3 5.5  --   --   --   HGB 13.0 12.4 11.8* 12.6 12.3  HCT 38.4 36.4 36 36 37  MCV 97.3 96.6  --   --   --   PLT 201.0 280 242 212 242   CBG: No results for input(s): GLUCAP in the last 8760 hours. TSH:  Recent Labs  06/26/14  TSH 6.26*   A1C: Lab Results  Component Value Date   HGBA1C 5.5 12/20/2012   06/21/14: BNP 522.6 Lab Results  Component Value Date   CHOL 177 12/20/2012   HDL 51 12/20/2012   LDLCALC 104* 12/20/2012   TRIG 111 12/20/2012   CHOLHDL 3.5 12/20/2012    Assessment/Plan  Dementia with behavioral disturbance Worsening with periods of agitation and worsening confusion which is ongoing. Increase Cymbalta to 60mg  daily and add Ativan 0.25mg  q6 prn agitation. I spoke with her daughter and she is in agreement with the above plan  Allergic rhinitis Improved per the residents daughter. She has congestion in the morning but it improves as the day goes on, where as before the symptoms lasted all day.     Cindi Carbon, ANP [facility] [phone]

## 2014-08-21 LAB — TSH: TSH: 0.04 u[IU]/mL — AB (ref <=5.90)

## 2014-09-03 ENCOUNTER — Ambulatory Visit: Payer: Medicare PPO | Admitting: Cardiology

## 2014-09-25 ENCOUNTER — Encounter: Payer: Self-pay | Admitting: Adult Health

## 2014-09-25 ENCOUNTER — Non-Acute Institutional Stay: Payer: Medicare PPO | Admitting: Adult Health

## 2014-09-25 DIAGNOSIS — Z9181 History of falling: Secondary | ICD-10-CM | POA: Diagnosis not present

## 2014-09-25 DIAGNOSIS — I6389 Other cerebral infarction: Secondary | ICD-10-CM

## 2014-09-25 DIAGNOSIS — I482 Chronic atrial fibrillation, unspecified: Secondary | ICD-10-CM

## 2014-09-25 DIAGNOSIS — E785 Hyperlipidemia, unspecified: Secondary | ICD-10-CM | POA: Diagnosis not present

## 2014-09-25 DIAGNOSIS — F0391 Unspecified dementia with behavioral disturbance: Secondary | ICD-10-CM

## 2014-09-25 DIAGNOSIS — I638 Other cerebral infarction: Secondary | ICD-10-CM

## 2014-09-25 DIAGNOSIS — J3089 Other allergic rhinitis: Secondary | ICD-10-CM

## 2014-09-25 DIAGNOSIS — Z7901 Long term (current) use of anticoagulants: Secondary | ICD-10-CM | POA: Diagnosis not present

## 2014-09-25 DIAGNOSIS — F03918 Unspecified dementia, unspecified severity, with other behavioral disturbance: Secondary | ICD-10-CM

## 2014-09-25 NOTE — Progress Notes (Signed)
Patient ID: Barbara [name], female   DOB: 07-Jun-1929, 81 y.o.   MRN: [id number]  Nursing Home Location:  [place] of Service: ALF [phone])  Chief Complaint  Patient presents with  . Acute Visit    fall  . Medical Management of Chronic Issues    HPI:  This is an 81 y.o. Female living in [place] at [facility]. I was asked to see her today due to the fact that she has fallen 3 x in the past 36 hrs. She sustained a skin tear to her right arm but no other major injury. She has dementia with agitation and had received Ativan on 2/14 and 2/15.  The staff reported that there was a loss of balance because she forgets to use her walker and also has visual deficits. There were no reports of syncope or dizziness.  I am also here to review her chronic medical issues. She has lost 5 lbs in the past two months. Her VS have been overall stable. She has a Actuary during the day and tends to get agitated in the afternoon and that is when she tends to fall. She has a hx of CVA and is on Eliquis for afib and stroke risk reduction. This was stopped in the past due to GI bleed but no obvious cause was identified. When the Eliquis was stopped she unfortunately had another CVA.    Review of Systems:  Review of Systems  Constitutional: Negative for fever, chills, diaphoresis, activity change, appetite change, fatigue and unexpected weight change.  HENT: Positive for postnasal drip, rhinorrhea and trouble swallowing. Negative for congestion, ear discharge, ear pain, sinus pressure, sneezing, sore throat, tinnitus and voice change.   Eyes: Negative.   Respiratory: Negative for cough, choking, chest tightness, shortness of breath, wheezing and stridor.   Cardiovascular: Negative for chest pain, palpitations and leg swelling.  Gastrointestinal: Negative.  Negative for abdominal pain, diarrhea, constipation and abdominal distention.  Endocrine: Negative.     Genitourinary: Negative for dysuria and difficulty urinating.       Mostly continent  Musculoskeletal: Negative for myalgias, gait problem and neck pain.       Uses walker  Skin: Negative.  Negative for pallor and rash.  Allergic/Immunologic: Positive for environmental allergies. Negative for food allergies.  Neurological: Negative for dizziness, tremors, seizures, syncope, facial asymmetry, speech difficulty, light-headedness, numbness and headaches.  Hematological: Negative.   Psychiatric/Behavioral: Positive for confusion. Negative for agitation. The patient is nervous/anxious.   09/24/13 MMSE 20/30, failed clock  Medications: Patient's Medications  New Prescriptions   No medications on file  Previous Medications   ACETAMINOPHEN (TYLENOL) 325 MG TABLET    Take 650 mg by mouth 2 (two) times daily.   ALBUTEROL (ACCUNEB) 1.25 MG/3ML NEBULIZER SOLUTION    Take 1 ampule by nebulization every 6 (six) hours as needed for wheezing.   ATORVASTATIN (LIPITOR) 10 MG TABLET    Take one tablet daily   CALCIUM CARBONATE (OS-CAL) 600 MG TABS TABLET    Take 600 mg by mouth daily with breakfast.   CHOLECALCIFEROL (VITAMIN D) 1000 UNITS TABLET    Take 1,000 Units by mouth daily.   DILTIAZEM CD 240 MG 24 HR CAPSULE    Take one tablet daily   DORZOLAMIDE-TIMOLOL (COSOPT) 22.3-6.8 MG/ML OPHTHALMIC SOLUTION    Place 1 drop into both eyes 2 (two) times daily.   DULOXETINE (CYMBALTA) 60 MG CAPSULE    Take 60 mg by mouth  daily.   ELIQUIS 2.5 MG TABS TABLET    Take 2.5 mg by mouth 2 (two) times daily.    LEVOTHYROXINE (SYNTHROID, LEVOTHROID) 100 MCG TABLET    Take 100 mcg by mouth daily before breakfast.   LORAZEPAM (ATIVAN) 0.5 MG TABLET    Take 0.25 mg by mouth every 6 (six) hours as needed for anxiety.   MONTELUKAST (SINGULAIR) 10 MG TABLET    Take 10 mg by mouth at bedtime.   MULTIPLE VITAMINS-MINERALS (PRESERVISION/LUTEIN) CAPS    Take 2 capsules by mouth daily.   OMEPRAZOLE (PRILOSEC) 20 MG CAPSULE     Take 20 mg by mouth 2 (two) times daily before a meal.   POLYETHYLENE GLYCOL (MIRALAX / GLYCOLAX) PACKET    Take 17 g by mouth daily. Mix with 6oz beverage of choice. Hold for loose stool   TERIPARATIDE, RECOMBINANT, (FORTEO) 600 MCG/2.4ML SOLN    Inject 20 mcg into the skin every morning.   TRAVOPROST, BAK FREE, (TRAVATAN Z) 0.004 % SOLN OPHTHALMIC SOLUTION    1 drop at bedtime.  Modified Medications   No medications on file  Discontinued Medications   No medications on file     Physical Exam:  Filed Vitals:   09/25/14 1341  BP: 127/77  Pulse: 80  Temp: 97 F (36.1 C)  Resp: 18  Weight: 104 lb (47.174 kg)  SpO2: 95%   Physical Exam  Constitutional: No distress.  Frail, short stature  HENT:  Head: Normocephalic and atraumatic.  Right Ear: External ear normal.  Left Ear: External ear normal.  Mouth/Throat: No oropharyngeal exudate.  Eyes: Conjunctivae and EOM are normal. Pupils are equal, round, and reactive to light.  Neck: No JVD present. No thyromegaly present.  Decreased ROM to neck  Cardiovascular: Normal rate and normal heart sounds.   No murmur heard. Irregular rhythm  Pulmonary/Chest: Effort normal and breath sounds normal. No stridor. No respiratory distress.  Abdominal: Soft. Bowel sounds are normal.  Musculoskeletal: Normal range of motion.  kyphosis  Lymphadenopathy:    She has no cervical adenopathy.  Neurological: She is alert. No cranial nerve deficit. Gait normal. Coordination normal.  Oriented to self and place, but forgetful on the details of her care and the date. Steady gait with walker  Skin: Skin is warm and dry. She is not diaphoretic.  Psychiatric: Affect normal.     Labs reviewed/Significant Diagnostic Results:  PCXR 06/28/14: cardiomegaly without evidence of CHF, negative for pna  06/14/14:Barium swallow was performed on 06/14/14 that showed decreased esophageal motility.  01/16/14: Capsule endoscopy: normal study, no GI source of anemia or  intermittent heme pos stool  Basic Metabolic Panel:  Recent Labs  11/03/13 1422 06/21/14  NA 138 136*  K 4.4 3.8  12.6*  CL 103  --   CO2 21  --   GLUCOSE 87  --   BUN 17 24*  CREATININE 0.70 0.8  CALCIUM 9.1  --    CBC:  Recent Labs  11/03/13 1422 01/30/14 06/21/14 06/26/14  WBC 8.5 6.0 12.8 6.4  NEUTROABS 5.5  --   --   --   HGB 12.4 11.8* 12.6 12.3  HCT 36.4 36 36 37  MCV 96.6  --   --   --   PLT 280 242 212 242   CBG: No results for input(s): GLUCAP in the last 8760 hours. TSH:  Recent Labs  06/26/14 08/21/14  TSH 6.26* 0.04*   A1C: Lab Results  Component Value Date  HGBA1C 5.5 12/20/2012   06/21/14: BNP 522.6 Lab Results  Component Value Date   CHOL 120 07/26/2014   HDL 40 07/26/2014   LDLCALC 63 07/26/2014   TRIG 85 07/26/2014   CHOLHDL 3.5 12/20/2012    Assessment/Plan  1. History of fall Continue to monitor and use fall prec. Her falls are most likely due to her dementia (forgetting to use her walker), her visual deficit, and poor coordination due to dementia. She also received Ativan, complicating the picture  2. Long term current use of anticoagulant therapy I discussed with her daughter, [name], the risk vs benefit of continuing Eliquis. She is a high fall risk and but also, when the Eliquis was stopped in the past she immediately had another CVA within 2 weeks. We decided to continue the Eliquis in hopes that with more monitoring and less use of the Ativan her falls would improve. If this situation does not improve we will need to reconsider this decision.   3. Dementia with behavioral disturbance Continue current meds, will d/c ativan if falls continue. She only uses it periodically and become very agitated in the afternoon and is not redirected easily. She is on Cymbalta for depression, anxiety, and hopefully to improve appetite. She has continued to lose weight but there is some reported improvement in her mood since the med was started. Would  continue for now and reassess at the next visit.  4. Cerebral infarction due to other mechanism No new events, continue Eliquis, see above  5. Chronic atrial fibrillation Rate controlled, see above  6. Other allergic rhinitis Continue Singulair, reported improvement per resident and daughter  59. Hyperlipidemia Controlled. LDL 63 on lipitor. Continue current meds and monitor lipids/lfts at regular intervals   Cindi Carbon, ANP [facility] [phone]

## 2014-09-26 ENCOUNTER — Encounter: Payer: Self-pay | Admitting: Internal Medicine

## 2014-09-26 ENCOUNTER — Other Ambulatory Visit: Payer: Self-pay | Admitting: Internal Medicine

## 2014-09-26 ENCOUNTER — Non-Acute Institutional Stay (SKILLED_NURSING_FACILITY): Payer: Medicare PPO | Admitting: Internal Medicine

## 2014-09-26 DIAGNOSIS — N6310 Unspecified lump in the right breast, unspecified quadrant: Secondary | ICD-10-CM

## 2014-09-26 DIAGNOSIS — N63 Unspecified lump in breast: Secondary | ICD-10-CM

## 2014-09-26 LAB — BASIC METABOLIC PANEL
BUN: 16 mg/dL (ref 4–21)
CREATININE: 0.7 mg/dL (ref 0.5–1.1)
Glucose: 97 mg/dL
Potassium: 3.7 mmol/L (ref 3.4–5.3)
Sodium: 142 mmol/L (ref 137–147)

## 2014-09-26 NOTE — Progress Notes (Signed)
Patient ID: Barbara [name], female   DOB: August 09, 1929, 81 y.o.   MRN: [id number]  Location:  [facility] Provider:  Anokhi Shannon L. Mariea Clonts, D.O., C.M.D.  Code Status:  DNR Advanced Directive information Does patient have an advance directive?: Yes, Type of Advance Directive: [place];Out of facility DNR (pink MOST or yellow form), Pre-existing out of facility DNR order (yellow form or pink MOST form): Yellow form placed in chart (order not valid for inpatient use), Does patient want to make changes to advanced directive?: No - Patient declined  Chief Complaint  Patient presents with  . Acute Visit    golf ball sized nodule above right breast/axillary region    HPI:  81 yo white female long term care resident in assisted living memory care unit of [facility] was seen for acute visit due to newly discovered golf ball sized palpable mass of her right axillary region, upper outer quadrant of breast.  It is tender to deep palpation.  No other areas have been noted.  She has lost 7 lbs since November, but has historically weighed about the same as she does now earlier last year.  Review of Systems:  Review of Systems  Constitutional: Positive for weight loss. Negative for malaise/fatigue.  Skin: Negative for itching and rash.  Neurological: Negative for weakness.  Endo/Heme/Allergies:       Right breast mass  Psychiatric/Behavioral: Positive for memory loss.    Medications: Patient's Medications  New Prescriptions   No medications on file  Previous Medications   ACETAMINOPHEN (TYLENOL) 325 MG TABLET    Take 650 mg by mouth 2 (two) times daily.   ALBUTEROL (ACCUNEB) 1.25 MG/3ML NEBULIZER SOLUTION    Take 1 ampule by nebulization every 6 (six) hours as needed for wheezing.   ATORVASTATIN (LIPITOR) 10 MG TABLET    Take one tablet daily   CALCIUM CARBONATE (OS-CAL) 600 MG TABS TABLET    Take 600 mg by mouth daily with breakfast.   CHOLECALCIFEROL (VITAMIN D)  1000 UNITS TABLET    Take 1,000 Units by mouth daily.   DILTIAZEM CD 240 MG 24 HR CAPSULE    Take one tablet daily   DORZOLAMIDE-TIMOLOL (COSOPT) 22.3-6.8 MG/ML OPHTHALMIC SOLUTION    Place 1 drop into both eyes 2 (two) times daily.   DULOXETINE (CYMBALTA) 60 MG CAPSULE    Take 60 mg by mouth daily.   ELIQUIS 2.5 MG TABS TABLET    Take 2.5 mg by mouth 2 (two) times daily.    LEVOTHYROXINE (SYNTHROID, LEVOTHROID) 100 MCG TABLET    Take 100 mcg by mouth daily before breakfast.   LORAZEPAM (ATIVAN) 0.5 MG TABLET    Take 0.25 mg by mouth every 6 (six) hours as needed for anxiety.   MONTELUKAST (SINGULAIR) 10 MG TABLET    Take 10 mg by mouth at bedtime.   MULTIPLE VITAMINS-MINERALS (PRESERVISION/LUTEIN) CAPS    Take 2 capsules by mouth daily.   OMEPRAZOLE (PRILOSEC) 20 MG CAPSULE    Take 20 mg by mouth 2 (two) times daily before a meal.   POLYETHYLENE GLYCOL (MIRALAX / GLYCOLAX) PACKET    Take 17 g by mouth daily. Mix with 6oz beverage of choice. Hold for loose stool   TERIPARATIDE, RECOMBINANT, (FORTEO) 600 MCG/2.4ML SOLN    Inject 20 mcg into the skin every morning.   TRAVOPROST, BAK FREE, (TRAVATAN Z) 0.004 % SOLN OPHTHALMIC SOLUTION    1 drop at bedtime.  Modified Medications  No medications on file  Discontinued Medications   No medications on file    Physical Exam: Filed Vitals:   09/26/14 1516  BP: 140/85  Pulse: 52  Temp: 97.8 F (36.6 C)  Resp: 16  Height: 4\' 11"  (1.499 m)  Weight: 104 lb 9.6 oz (47.446 kg)  SpO2: 96%  Physical Exam  Pulmonary/Chest: Right breast exhibits mass and tenderness. Right breast exhibits no inverted nipple, no nipple discharge and no skin change. Left breast exhibits no inverted nipple, no mass, no nipple discharge, no skin change and no tenderness.    Mild tenderness of right breast and over palpable mass--feels fatty with hard area centrally; no palpable axillary, epitrochlear lymphadenopathy bilaterally    Labs reviewed: Basic Metabolic  Panel:  Recent Labs  11/03/13 1422 06/21/14  NA 138 136*  K 4.4 3.8  12.6*  CL 103  --   CO2 21  --   GLUCOSE 87  --   BUN 17 24*  CREATININE 0.70 0.8  CALCIUM 9.1  --     Liver Function Tests:  Recent Labs  06/21/14 07/26/14  AST 21 18  ALT 9 8  ALKPHOS 70 76    CBC:  Recent Labs  11/03/13 1422 01/30/14 06/21/14 06/26/14  WBC 8.5 6.0 12.8 6.4  NEUTROABS 5.5  --   --   --   HGB 12.4 11.8* 12.6 12.3  HCT 36.4 36 36 37  MCV 96.6  --   --   --   PLT 280 242 212 242    Assessment/Plan 1. Breast mass, right -upper outer quadrant near axilla -suspect this is lipoma, but hard area centrally is concerning -no one has ever made mention of this previously -has had weight loss -discussed with her daughter, Shanna Cisco hcpoa who agrees with obtaining an ultrasound to assess the area and take further steps if necessary which will be discussed along the way--ultrasound order was written  Family/ staff Communication: discussed with her daughter, Shanna Cisco hcpoa and nursing staff  Goals of care: long term care--currently AL resident with caregiver; DNR code status, has HCPOA  Labs/tests ordered:  Has TSH 2/23, bmp was done this am

## 2014-09-27 ENCOUNTER — Ambulatory Visit: Payer: Medicare PPO | Admitting: Cardiology

## 2014-09-28 ENCOUNTER — Other Ambulatory Visit: Payer: Self-pay | Admitting: Internal Medicine

## 2014-09-28 ENCOUNTER — Ambulatory Visit
Admission: RE | Admit: 2014-09-28 | Discharge: 2014-09-28 | Disposition: A | Discharge status: Home / Self Care | Payer: Medicare PPO | Source: Ambulatory Visit

## 2014-09-28 ENCOUNTER — Ambulatory Visit
Admission: RE | Admit: 2014-09-28 | Discharge: 2014-09-28 | Disposition: A | Discharge status: Home / Self Care | Payer: Medicare PPO | Source: Ambulatory Visit | Attending: Internal Medicine | Admitting: Internal Medicine

## 2014-09-28 DIAGNOSIS — N6310 Unspecified lump in the right breast, unspecified quadrant: Secondary | ICD-10-CM

## 2014-10-06 LAB — CBC AND DIFFERENTIAL
HCT: 37 % (ref 36–46)
Hemoglobin: 13 g/dL (ref 12.0–16.0)
PLATELETS: 264 10*3/uL (ref 150–399)
WBC: 7.9 10*3/mL

## 2014-10-06 LAB — TSH: TSH: 0.24 u[IU]/mL — AB (ref 0.41–5.90)

## 2014-10-08 ENCOUNTER — Encounter: Payer: Self-pay | Admitting: Adult Health

## 2014-10-08 ENCOUNTER — Non-Acute Institutional Stay: Payer: Medicare PPO | Admitting: Adult Health

## 2014-10-08 DIAGNOSIS — Z7901 Long term (current) use of anticoagulants: Secondary | ICD-10-CM | POA: Diagnosis not present

## 2014-10-08 DIAGNOSIS — T148 Other injury of unspecified body region: Secondary | ICD-10-CM

## 2014-10-08 DIAGNOSIS — R296 Repeated falls: Secondary | ICD-10-CM | POA: Diagnosis not present

## 2014-10-08 DIAGNOSIS — T148XXA Other injury of unspecified body region, initial encounter: Secondary | ICD-10-CM

## 2014-10-08 NOTE — Progress Notes (Signed)
Patient ID: Barbara [name], female   DOB: May 29, 1929, 82 y.o.   MRN: [id number]     Nursing Home Location:    [facility]  Place of Service:  Memory care [place] Complaint  Patient presents with  . Acute Visit    increasing bruising, falls, ?eliquis    HPI:  This is an 81 y.o. Female living in [place] at [facility]. I was asked to see her today by the staff in f/u from some bruising noted to her right hand and a hematoma to her right knee and right hip. She has been falling frequently due to her dementia, poor coordination, and visual deficit. She has a hx of CVA and is on Eliquis for afib and stroke risk reduction. This was stopped in the past due to GI bleed but no obvious cause was identified. When the Eliquis was stopped she unfortunately had another CVA. She was seen by ortho on 10/03/14 due to swelling to the right hip and right knee. Xrays were obtained showing no fx but a hematoma. She has sitters during the day and fall precautions are in place. She has worked with physical therapy to prevent falls. She frequently forgets her walker and bumps into things due to her poor vision. Today she denies any pain or headaches. She has not hit hurt head during these falls but there is expressed concern for the continued administration of Eliquis. Overall she has functionally declined over time. She is recently moved from AL to memory care and is requiring more assistance and monitoring.    Review of Systems:  Review of Systems  Constitutional: Negative for fever, chills, diaphoresis, activity change, appetite change, fatigue and unexpected weight change.  HENT: Positive for postnasal drip, rhinorrhea and trouble swallowing. Negative for congestion, ear discharge, ear pain, sinus pressure, sneezing, sore throat, tinnitus and voice change.   Eyes: Negative.   Respiratory: Negative for cough, choking, chest tightness, shortness of breath, wheezing and  stridor.   Cardiovascular: Negative for chest pain, palpitations and leg swelling.  Gastrointestinal: Negative.  Negative for abdominal pain, diarrhea, constipation and abdominal distention.  Endocrine: Negative.   Genitourinary: Negative for dysuria and difficulty urinating.       Mostly continent  Musculoskeletal: Positive for joint swelling. Negative for myalgias, gait problem and neck pain.       Uses walker  Skin: Positive for color change. Negative for pallor and rash.  Allergic/Immunologic: Positive for environmental allergies. Negative for food allergies.  Neurological: Negative for dizziness, tremors, seizures, syncope, facial asymmetry, speech difficulty, light-headedness, numbness and headaches.  Hematological: Bruises/bleeds easily.  Psychiatric/Behavioral: Positive for confusion. Negative for agitation. The patient is nervous/anxious.   09/24/13 MMSE 20/30, failed clock  Medications: Patient's Medications  New Prescriptions   No medications on file  Previous Medications   ACETAMINOPHEN (TYLENOL) 325 MG TABLET    Take 650 mg by mouth 2 (two) times daily.   ALBUTEROL (ACCUNEB) 1.25 MG/3ML NEBULIZER SOLUTION    Take 1 ampule by nebulization every 6 (six) hours as needed for wheezing.   ATORVASTATIN (LIPITOR) 10 MG TABLET    Take one tablet daily   CALCIUM CARBONATE (OS-CAL) 600 MG TABS TABLET    Take 600 mg by mouth daily with breakfast.   CHOLECALCIFEROL (VITAMIN D) 1000 UNITS TABLET    Take 1,000 Units by mouth daily.   DILTIAZEM CD 240 MG 24 HR CAPSULE    Take one tablet daily   DORZOLAMIDE-TIMOLOL (COSOPT) 22.3-6.8 MG/ML  OPHTHALMIC SOLUTION    Place 1 drop into both eyes 2 (two) times daily.   DULOXETINE (CYMBALTA) 60 MG CAPSULE    Take 60 mg by mouth daily.   ELIQUIS 2.5 MG TABS TABLET    Take 2.5 mg by mouth 2 (two) times daily.    LEVOTHYROXINE (SYNTHROID, LEVOTHROID) 100 MCG TABLET    Take 100 mcg by mouth daily before breakfast.   LORAZEPAM (ATIVAN) 0.5 MG TABLET     Take 0.25 mg by mouth every 6 (six) hours as needed for anxiety.   MONTELUKAST (SINGULAIR) 10 MG TABLET    Take 10 mg by mouth at bedtime.   MULTIPLE VITAMINS-MINERALS (PRESERVISION/LUTEIN) CAPS    Take 2 capsules by mouth daily.   OMEPRAZOLE (PRILOSEC) 20 MG CAPSULE    Take 20 mg by mouth 2 (two) times daily before a meal.   POLYETHYLENE GLYCOL (MIRALAX / GLYCOLAX) PACKET    Take 17 g by mouth daily. Mix with 6oz beverage of choice. Hold for loose stool   TERIPARATIDE, RECOMBINANT, (FORTEO) 600 MCG/2.4ML SOLN    Inject 20 mcg into the skin every morning.   TRAVOPROST, BAK FREE, (TRAVATAN Z) 0.004 % SOLN OPHTHALMIC SOLUTION    1 drop at bedtime.  Modified Medications   No medications on file  Discontinued Medications   No medications on file     Physical Exam:  Filed Vitals:   10/08/14 1202  BP: 121/81  Pulse: 88  Temp: 97.4 F (36.3 C)  Resp: 16  Weight: 104 lb (47.174 kg)  SpO2: 95%   Physical Exam  Constitutional: No distress.  Frail, short stature  HENT:  Head: Normocephalic and atraumatic.  Right Ear: External ear normal.  Left Ear: External ear normal.  Mouth/Throat: No oropharyngeal exudate.  Eyes: Conjunctivae and EOM are normal. Pupils are equal, round, and reactive to light.  Neck: No JVD present. No thyromegaly present.  Decreased ROM to neck  Cardiovascular: Normal rate and normal heart sounds.   No murmur heard. Irregular rhythm  Pulmonary/Chest: Effort normal and breath sounds normal. No stridor. No respiratory distress.  Abdominal: Soft. Bowel sounds are normal.  Musculoskeletal:  Kyphosis. Hematoma noted to right hip with swelling, as well as right knee with dependent bruising to shin. Pain with ROM or palpation  Lymphadenopathy:    She has no cervical adenopathy.  Neurological: She is alert. No cranial nerve deficit. Gait normal. Coordination normal.  Oriented to self and place, but forgetful on the details of her care and the date. Steady gait with  walker  Skin: Skin is warm and dry. She is not diaphoretic.  Bruising to right hand and skin tear to RUA  Psychiatric: Affect normal.     Labs reviewed/Significant Diagnostic Results:  PCXR 06/28/14: cardiomegaly without evidence of CHF, negative for pna  06/14/14:Barium swallow was performed on 06/14/14 that showed decreased esophageal motility.  01/16/14: Capsule endoscopy: normal study, no GI source of anemia or intermittent heme pos stool  Basic Metabolic Panel:  Recent Labs  11/03/13 1422 06/21/14 09/26/14  NA 138 136* 142  K 4.4 3.8  12.6* 3.7  CL 103  --   --   CO2 21  --   --   GLUCOSE 87  --   --   BUN 17 24* 16  CREATININE 0.70 0.8 0.7  CALCIUM 9.1  --   --    CBC:  Recent Labs  11/03/13 1422 01/30/14 06/21/14 06/26/14  WBC 8.5 6.0 12.8 6.4  NEUTROABS  5.5  --   --   --   HGB 12.4 11.8* 12.6 12.3  HCT 36.4 36 36 37  MCV 96.6  --   --   --   PLT 280 242 212 242   CBG: No results for input(s): GLUCAP in the last 8760 hours. TSH:  Recent Labs  06/26/14 08/21/14  TSH 6.26* 0.04*   A1C: Lab Results  Component Value Date   HGBA1C 5.5 12/20/2012   06/21/14: BNP 522.6 Lab Results  Component Value Date   CHOL 120 07/26/2014   HDL 40 07/26/2014   LDLCALC 63 07/26/2014   TRIG 85 07/26/2014   CHOLHDL 3.5 12/20/2012    Assessment/Plan  1. Hematoma Noted to right hip and right knee after a fall. Seen by ortho. Continue current activity status and use Tylenol prn for pain.   2. Falls frequently Fall prec in place. Using less ativan. Has visual deficits, as well as cognitive deficits making her at high risk for falls. She has sitters during the day but not at night, which is when the falls tend to occur. Due to her dementia she is not easily redirected. She also forgets to use her walker.   3. Long term current use of anticoagulant therapy Continue Eliquis for now but due to her risk of bleeding from falls it may be reasonable to discontinue it and start  a full strength aspirin instead. She is going to see cardiology on 10/17/14 and look forward to their input. I spoke with Shanna Cisco, the resident's daughter, and she is aware of this issue and will be going on the apt with the resident.   Cindi Carbon, ANP [facility] [phone]

## 2014-10-15 ENCOUNTER — Encounter: Payer: Self-pay | Admitting: Internal Medicine

## 2014-10-16 ENCOUNTER — Encounter: Payer: Medicare PPO | Admitting: Internal Medicine

## 2014-10-17 ENCOUNTER — Ambulatory Visit (INDEPENDENT_AMBULATORY_CARE_PROVIDER_SITE_OTHER): Payer: Medicare PPO | Admitting: Cardiology

## 2014-10-17 ENCOUNTER — Encounter: Payer: Self-pay | Admitting: Cardiology

## 2014-10-17 VITALS — BP 104/70 | HR 88 | Ht 59.0 in | Wt 103.0 lb

## 2014-10-17 DIAGNOSIS — I4819 Other persistent atrial fibrillation: Secondary | ICD-10-CM

## 2014-10-17 DIAGNOSIS — I481 Persistent atrial fibrillation: Secondary | ICD-10-CM

## 2014-10-17 DIAGNOSIS — Z7901 Long term (current) use of anticoagulants: Secondary | ICD-10-CM

## 2014-10-17 DIAGNOSIS — F0390 Unspecified dementia without behavioral disturbance: Secondary | ICD-10-CM

## 2014-10-17 MED ORDER — ASPIRIN EC 81 MG PO TBEC: 81.0000 mg | DELAYED_RELEASE_TABLET | Freq: Every day | ORAL | Status: DC

## 2014-10-17 NOTE — Progress Notes (Signed)
Cardiology Office Note   Date:  10/17/2014   ID:  Barbara [name], DOB [birth year], MRN [id number]  PCP:  Hollace Kinnier, DO  Cardiologist:   Candee Furbish, MD   Atrial fibrillation follow-up She currently resides at [facility], memory care unit.    History of Present Illness: Barbara [name] is a 81 y.o. female who presents for atrial fibrillation follow-up. Persistent atrial fibrillation. First episode detected was in July 2009, stress test previous to that in 2008 was low risk with ejection fraction of 70%, stroke in May 2014 with new onset anticoagulation here for follow-up.   In 2014 she developed anemia, Dr. [name] with gastroenterology helped her through this situation. When her anticoagulation was stopped, she unfortunately had another stroke. Thought is that she was stable to start anticoagulation. Capsule endoscopy 01/16/14 was normal.  She has had falls, bruising, right hand hematoma, right knee, right hip and has been increasingly unsteady secondary to her dementia, poor coordination and visual deficit. Orthopedics examined, only hematoma. No breaks.  Shanna Cisco, her daughter, has discussed this/is aware of recent falls. Cindi Carbon, ANP note reviewed with [facility].    Past Medical History  Diagnosis Date  . Atrial fibrillation 2009    Long term anticoagulation. Warfarin changed to Xarelto 06/2012  . Cystocele, midline   . Senile dementia, uncomplicated   . Generalized anxiety disorder   . Unspecified glaucoma   . Macular degeneration (senile) of retina, unspecified   . Osteoarthrosis, unspecified whether generalized or localized, unspecified site   . Senile osteoporosis   . Pneumonia, organism unspecified 03/2012    Tx w/ PO antibx  . Unspecified vitamin D deficiency   . Unspecified venous (peripheral) insufficiency   . Tear film insufficiency, unspecified 08/2012  . Debility, unspecified 04/01/2012  . Long term (current)  use of anticoagulants 04/01/2012  . CVA (cerebral infarction) 11/19/2012  . Anemia 11/2012    transfusion 11/2012  . Unspecified hypothyroidism   . Other and unspecified hyperlipidemia     Atorvastatin started 12/2012  . Benign paroxysmal positional vertigo 11/08/2013  . Glaucoma   . Stroke 2014  . Contusion, chest wall 11/27/2013    Left side.   Marland Kitchen History of fall 11/27/2013  . Wound of right leg 11/13/2013    posteriorly     Past Surgical History  Procedure Laterality Date  . Tonsillectomy and adenoidectomy    . Umbilical hernia repair    . Cataract extraction w/ intraocular lens  implant, bilateral    . Spine surgery      RE; Spinal stenosis  . Esophagogastroduodenoscopy (egd) with propofol N/A 11/21/2013    Procedure: ESOPHAGOGASTRODUODENOSCOPY (EGD) WITH PROPOFOL;  Surgeon: Cleotis Nipper, MD;  Location: [facility] ENDOSCOPY;  Service: Endoscopy;  Laterality: N/A;  . Colonoscopy with propofol N/A 11/21/2013    Procedure: COLONOSCOPY WITH PROPOFOL;  Surgeon: Cleotis Nipper, MD;  Location: [facility] ENDOSCOPY;  Service: Endoscopy;  Laterality: N/A;     Current Outpatient Prescriptions  Medication Sig Dispense Refill  . acetaminophen (TYLENOL) 325 MG tablet Take 650 mg by mouth 2 (two) times daily.    Marland Kitchen albuterol (ACCUNEB) 1.25 MG/3ML nebulizer solution Take 1 ampule by nebulization every 6 (six) hours as needed for wheezing.    Marland Kitchen atorvastatin (LIPITOR) 10 MG tablet Take one tablet daily    . calcium carbonate (OS-CAL) 600 MG TABS tablet Take 600 mg by mouth daily with breakfast.    . cholecalciferol (VITAMIN D) 1000  UNITS tablet Take 1,000 Units by mouth daily.    Marland Kitchen DILTIAZEM CD 240 MG 24 hr capsule Take one tablet daily    . dorzolamide-timolol (COSOPT) 22.3-6.8 MG/ML ophthalmic solution Place 1 drop into both eyes 2 (two) times daily.    . DULoxetine (CYMBALTA) 30 MG capsule Take 30 mg by mouth 2 (two) times daily.    Marland Kitchen levothyroxine (SYNTHROID, LEVOTHROID) 50 MCG tablet Take 50 mcg by mouth  every morning. Before breakfast    . LORazepam (ATIVAN) 0.5 MG tablet Take 0.25 mg by mouth every 6 (six) hours as needed for anxiety.    . montelukast (SINGULAIR) 10 MG tablet Take 10 mg by mouth at bedtime.    . Multiple Vitamins-Minerals (PRESERVISION/LUTEIN) CAPS Take 2 capsules by mouth daily.    Marland Kitchen omeprazole (PRILOSEC) 20 MG capsule Take 20 mg by mouth 2 (two) times daily before a meal.    . polyethylene glycol (MIRALAX / GLYCOLAX) packet Take 17 g by mouth daily. Mix with 6oz beverage of choice. Hold for loose stool    . Teriparatide, Recombinant, (FORTEO) 600 MCG/2.4ML SOLN Inject 20 mcg into the skin every morning.    . Travoprost, BAK Free, (TRAVATAN Z) 0.004 % SOLN ophthalmic solution 1 drop at bedtime.    Marland Kitchen aspirin EC 81 MG tablet Take 1 tablet (81 mg total) by mouth daily.     No current facility-administered medications for this visit.    Allergies:   Aricept    Social History:  The patient  reports that she quit smoking about 33 years ago. Her smoking use included Cigarettes. She has never used smokeless tobacco. She reports that she does not drink alcohol or use illicit drugs.   Family History:  The patient's family history includes Heart attack in her father.    ROS:  Please see the history of present illness.  Denies any bleeding, bruising, orthopnea, PND, melena, chest pain. In the past, she has had multiple falls. Otherwise, review of systems are positive for none.   All other systems are reviewed and negative.    PHYSICAL EXAM: VS:  BP 104/70 mmHg  Pulse 88  Ht 4\' 11"  (1.499 m)  Wt 103 lb (46.72 kg)  BMI 20.79 kg/m2 , BMI Body mass index is 20.79 kg/(m^2). GEN: Elderly, in no acute distress HEENT: normal Neck: no JVD, carotid bruits, or masses Cardiac: Irreg irreg; no murmurs, rubs, or gallops,no edema  Respiratory:  clear to auscultation bilaterally, normal work of breathing GI: soft, nontender, nondistended, + BS MS: no deformity or atrophy Skin: warm and  dry, ecchymoses noted Neuro:  Strength and sensation are intact, uses a walker Psych: euthymic mood, memory impairment noted   EKG:  EKG is ordered today. The ekg ordered today demonstrates on 10/17/14-atrial fibrillation heart rate 88, old septal infarct pattern. No change from prior   Recent Labs: 06/26/2014: Hemoglobin 12.3; Platelets 242 07/26/2014: ALT 8 08/21/2014: TSH 0.04* 09/26/2014: BUN 16; Creatinine 0.7; Potassium 3.7; Sodium 142    Lipid Panel    Component Value Date/Time   CHOL 120 07/26/2014   TRIG 85 07/26/2014   HDL 40 07/26/2014   CHOLHDL 3.5 12/20/2012 0555   VLDL 22 12/20/2012 0555   LDLCALC 63 07/26/2014      Wt Readings from Last 3 Encounters:  10/17/14 103 lb (46.72 kg)  10/08/14 104 lb (47.174 kg)  09/26/14 104 lb 9.6 oz (47.446 kg)      Other studies Reviewed: Additional studies/ records that were reviewed  today include:  Nuclear stress test 2008-low risk, no ischemia. Normal ejection fraction.  Echocardiogram 12/20/12 - Left ventricle: The cavity size was normal. There was mild concentric hypertrophy. Systolic function was normal. The estimated ejection fraction was in the range of 60% to 65%. Wall motion was normal; there were no regional wall motion abnormalities. The study is not technically sufficient to allow evaluation of LV diastolic function. - Mitral valve: Moderately to severely calcified annulus. Mildly thickened leaflets . Mild to moderate regurgitation.    ASSESSMENT AND PLAN:  1.  Persistent atrial fibrillation-CHADS-VASc 5. Eliquis 2.5 g twice a day for anticoagulation, age and weight adjusted. She has a history of GI bleed. Worried about falls, quite substantial hematoma development. Worsening memory issues. I agree that after long discussion with daughter, [name], we have mutually decided to discontinue her Eliquis. Worried about recurrent stroke risk. This certainly is a possibility and we discussed this. We will  place her on aspirin 81 mg. She understands that this does not fully decrease risk of stroke. She is currently maintaining rate control with diltiazem.   2. Chronic anticoagulation-stopping Eliquis. She has had multiple falls, risks now of bleeding outweigh benefits.  3. History of stroke  4. Hyperlipidemia-low-dose atorvastatin, post stroke.   Current medicines are reviewed at length with the patient today.  The patient has concerns regarding medicines.  The following changes have been made:  Discontinuation of Eliquis  Labs/ tests ordered today include:   Orders Placed This Encounter  Procedures  . EKG 12-Lead     Disposition:   FU with 1year   Signed, Candee Furbish, MD  10/17/2014 [facility] [place], [place], [place]  [address] Phone: [phone]; Fax: [phone]

## 2014-10-17 NOTE — Patient Instructions (Addendum)
Your physician has recommended you make the following change in your medication:  STOP Eliquis  START Aspirin 81 mg once daily  Your physician wants you to follow-up in: 1 year with Dr. Marlou Porch.  You will receive a reminder letter in the mail two months in advance. If you don't receive a letter, please call our office to schedule the follow-up appointment.

## 2014-10-29 ENCOUNTER — Encounter: Payer: Self-pay | Admitting: Adult Health

## 2014-10-29 ENCOUNTER — Non-Acute Institutional Stay: Payer: Medicare PPO | Admitting: Adult Health

## 2014-10-29 DIAGNOSIS — F02818 Dementia in other diseases classified elsewhere, unspecified severity, with other behavioral disturbance: Secondary | ICD-10-CM

## 2014-10-29 DIAGNOSIS — F0281 Dementia in other diseases classified elsewhere with behavioral disturbance: Secondary | ICD-10-CM

## 2014-10-29 DIAGNOSIS — I482 Chronic atrial fibrillation, unspecified: Secondary | ICD-10-CM

## 2014-10-29 DIAGNOSIS — G308 Other Alzheimer's disease: Secondary | ICD-10-CM

## 2014-10-29 DIAGNOSIS — R296 Repeated falls: Secondary | ICD-10-CM | POA: Diagnosis not present

## 2014-10-29 DIAGNOSIS — G309 Alzheimer's disease, unspecified: Secondary | ICD-10-CM

## 2014-10-29 NOTE — Progress Notes (Signed)
Patient ID: Barbara [name], female   DOB: 11-Apr-1929, 81 y.o.   MRN: [id number]     Nursing Home Location:  [place] of Service: ALF [phone])  Chief Complaint  Patient presents with  . Acute Visit    f/u fall    HPI:  This is an 81 y.o. Female living in [place] at [facility]. I was asked to see her today due to multiple falls and concerns for a CVA. She did not have any changes in LOC, speech, or other focal deficit. She fell several times over the past week but did not sustain any major injuries. Xrays were obtained of the left knee and left hip, showing no acute fracture. She was started on Wellbutrin for depression/anxiety on 10/16/14. The staff is questioning if this is contributing to her falls, however, she has a hx of frequent falls in general. She did not sleep well over the weekend and her daughter is concerned that it may be affecting her sleep wake cycle.  Overall she has functionally declined over time. She is recently moved from AL to memory care and is requiring more assistance and monitoring. She was taken off Eliquis due to falls.    Review of Systems:  Review of Systems  Constitutional: Negative for fever, chills, diaphoresis, activity change, appetite change, fatigue and unexpected weight change.  HENT: Positive for postnasal drip, rhinorrhea and trouble swallowing. Negative for congestion, ear discharge, ear pain, sinus pressure, sneezing, sore throat, tinnitus and voice change.   Eyes: Negative.   Respiratory: Negative for cough, choking, chest tightness, shortness of breath, wheezing and stridor.   Cardiovascular: Negative for chest pain, palpitations and leg swelling.  Gastrointestinal: Negative.  Negative for abdominal pain, diarrhea, constipation and abdominal distention.  Endocrine: Negative.   Genitourinary: Negative for dysuria and difficulty urinating.       Mostly continent  Musculoskeletal: Negative for myalgias,  joint swelling, gait problem and neck pain.       Uses walker  Skin: Negative for color change, pallor and rash.  Allergic/Immunologic: Positive for environmental allergies. Negative for food allergies.  Neurological: Negative for dizziness, tremors, seizures, syncope, facial asymmetry, speech difficulty, light-headedness, numbness and headaches.  Hematological: Bruises/bleeds easily.  Psychiatric/Behavioral: Positive for confusion. Negative for agitation. The patient is nervous/anxious.   09/24/13 MMSE 20/30, failed clock  Medications: Patient's Medications  New Prescriptions   No medications on file  Previous Medications   ACETAMINOPHEN (TYLENOL) 325 MG TABLET    Take 650 mg by mouth 2 (two) times daily.   ALBUTEROL (ACCUNEB) 1.25 MG/3ML NEBULIZER SOLUTION    Take 1 ampule by nebulization every 6 (six) hours as needed for wheezing.   ASPIRIN EC 81 MG TABLET    Take 1 tablet (81 mg total) by mouth daily.   ATORVASTATIN (LIPITOR) 10 MG TABLET    Take one tablet daily   BUPROPION (WELLBUTRIN XL) 150 MG 24 HR TABLET    Take 150 mg by mouth daily.   CALCIUM CARBONATE (OS-CAL) 600 MG TABS TABLET    Take 600 mg by mouth daily with breakfast.   CHOLECALCIFEROL (VITAMIN D) 1000 UNITS TABLET    Take 1,000 Units by mouth daily.   DILTIAZEM CD 240 MG 24 HR CAPSULE    Take one tablet daily   DORZOLAMIDE-TIMOLOL (COSOPT) 22.3-6.8 MG/ML OPHTHALMIC SOLUTION    Place 1 drop into both eyes 2 (two) times daily.   DULOXETINE (CYMBALTA) 30 MG CAPSULE    Take  30 mg by mouth 2 (two) times daily.   LEVOTHYROXINE (SYNTHROID, LEVOTHROID) 50 MCG TABLET    Take 50 mcg by mouth every morning. Before breakfast   LORAZEPAM (ATIVAN) 0.5 MG TABLET    Take 0.25 mg by mouth every 6 (six) hours as needed for anxiety.   MONTELUKAST (SINGULAIR) 10 MG TABLET    Take 10 mg by mouth at bedtime.   MULTIPLE VITAMINS-MINERALS (PRESERVISION/LUTEIN) CAPS    Take 2 capsules by mouth daily.   OMEPRAZOLE (PRILOSEC) 20 MG CAPSULE     Take 20 mg by mouth daily.    POLYETHYLENE GLYCOL (MIRALAX / GLYCOLAX) PACKET    Take 17 g by mouth daily. Mix with 6oz beverage of choice. Hold for loose stool   TERIPARATIDE, RECOMBINANT, (FORTEO) 600 MCG/2.4ML SOLN    Inject 20 mcg into the skin every morning.   TRAVOPROST, BAK FREE, (TRAVATAN Z) 0.004 % SOLN OPHTHALMIC SOLUTION    1 drop at bedtime.  Modified Medications   No medications on file  Discontinued Medications   No medications on file     Physical Exam:  Filed Vitals:   10/29/14 1634  BP: 97/67  Pulse: 80  Temp: 97.2 F (36.2 C)  Resp: 20  Weight: 103 lb (46.72 kg)  SpO2: 96%   Physical Exam  Constitutional: No distress.  Frail, short stature  HENT:  Head: Normocephalic and atraumatic.  Right Ear: External ear normal.  Left Ear: External ear normal.  Mouth/Throat: No oropharyngeal exudate.  Eyes: Conjunctivae and EOM are normal. Pupils are equal, round, and reactive to light.  Neck: No JVD present. No thyromegaly present.  Decreased ROM to neck  Cardiovascular: Normal rate and normal heart sounds.   No murmur heard. Irregular rhythm  Pulmonary/Chest: Effort normal and breath sounds normal. No stridor. No respiratory distress.  Abdominal: Soft. Bowel sounds are normal.  Musculoskeletal: Normal range of motion. She exhibits no edema or tenderness.  Kyphosis. Ambulates with a walker  Lymphadenopathy:    She has no cervical adenopathy.  Neurological: She is alert. No cranial nerve deficit. Gait normal. Coordination normal.  Oriented to self and place, but forgetful on the details of her care and the date. Steady gait with walker  Skin: Skin is warm and dry. She is not diaphoretic.  Psychiatric: Affect normal.     Labs reviewed/Significant Diagnostic Results:  PCXR 06/28/14: cardiomegaly without evidence of CHF, negative for pna  06/14/14:Barium swallow was performed on 06/14/14 that showed decreased esophageal motility.  01/16/14: Capsule endoscopy:  normal study, no GI source of anemia or intermittent heme pos stool  Basic Metabolic Panel:  Recent Labs  11/03/13 1422 06/21/14 09/26/14  NA 138 136* 142  K 4.4 3.8  12.6* 3.7  CL 103  --   --   CO2 21  --   --   GLUCOSE 87  --   --   BUN 17 24* 16  CREATININE 0.70 0.8 0.7  CALCIUM 9.1  --   --    CBC:  Recent Labs  11/03/13 1422  06/21/14 06/26/14 10/06/14  WBC 8.5  < > 12.8 6.4 7.9  NEUTROABS 5.5  --   --   --   --   HGB 12.4  < > 12.6 12.3 13.0  HCT 36.4  < > 36 37 37  MCV 96.6  --   --   --   --   PLT 280  < > 212 242 264  < > = values in  this interval not displayed. CBG: No results for input(s): GLUCAP in the last 8760 hours. TSH:  Recent Labs  06/26/14 08/21/14 10/06/14  TSH 6.26* 0.04* 0.24*   A1C: Lab Results  Component Value Date   HGBA1C 5.5 12/20/2012   06/21/14: BNP 522.6 Lab Results  Component Value Date   CHOL 120 07/26/2014   HDL 40 07/26/2014   LDLCALC 63 07/26/2014   TRIG 85 07/26/2014   CHOLHDL 3.5 12/20/2012    Assessment/Plan  1. Frequent falls Due to poor vision, dementia, and debility. Continue fall precautions. Use walker with  ambulation.   2. Dementia with behavioral disturbance Continue Wellbutrin. Monitor sleep/wake pattern and record in the notes. Notify [facility] if sleeplessness continues. She apparently did sleep well last night. I do not think that her falls are contributed to this med at this point, as she was falling prior to the medication. She will need to be on this medication to evaluate for therapeutic effect.   3. Afib Continue ASA and Cardizem for rate control, no sign of CVA    Cindi Carbon, ANP [facility] [phone]

## 2014-12-03 ENCOUNTER — Non-Acute Institutional Stay: Payer: Medicare PPO | Admitting: Adult Health

## 2014-12-03 DIAGNOSIS — B349 Viral infection, unspecified: Secondary | ICD-10-CM

## 2014-12-03 DIAGNOSIS — M25552 Pain in left hip: Secondary | ICD-10-CM

## 2014-12-06 ENCOUNTER — Encounter: Payer: Self-pay | Admitting: Adult Health

## 2014-12-06 NOTE — Progress Notes (Signed)
Patient ID: Barbara [name], female   DOB: November 02, 1928, 81 y.o.   MRN: [id number]     Nursing Home Location:  [place]: ALF (13)  No chief complaint on file.   HPI:  This is an 81 y.o. Female living in [place] at [facility], memory section. She has a hx of afib, CVA, dementia, GERD, constipation, dysphagia, and OP. I was asked to see her today due to reports of left hip pain. She woke up this morning and did not want to get out of bed or eat breakfast. She reported that her left hip was hurting. She has a h/o frequent falls and there was a bruise noted to this area. She has not had a fever, runny nose, congestion, dysuria, diarrhea, etc. She is ambulatory with a walker and is able to walk without difficulty.   Review of Systems:  Review of Systems  Constitutional: Negative for fever, chills, diaphoresis, activity change, appetite change, fatigue and unexpected weight change.  HENT: Positive for postnasal drip, rhinorrhea and trouble swallowing. Negative for congestion, ear discharge, ear pain, sinus pressure, sneezing, sore throat, tinnitus and voice change.   Eyes: Negative.   Respiratory: Negative for cough, choking, chest tightness, shortness of breath, wheezing and stridor.   Cardiovascular: Negative for chest pain, palpitations and leg swelling.  Gastrointestinal: Negative.  Negative for abdominal pain, diarrhea, constipation and abdominal distention.  Endocrine: Negative.   Genitourinary: Negative for dysuria and difficulty urinating.       Mostly continent  Musculoskeletal: Positive for arthralgias. Negative for myalgias, joint swelling, gait problem and neck pain.       Uses walker  Skin: Negative for color change, pallor and rash.  Allergic/Immunologic: Positive for environmental allergies. Negative for food allergies.  Neurological: Negative for dizziness, tremors, seizures, syncope, facial asymmetry, speech  difficulty, light-headedness, numbness and headaches.  Hematological: Bruises/bleeds easily.  Psychiatric/Behavioral: Positive for confusion. Negative for agitation. The patient is nervous/anxious.   09/24/13 MMSE 20/30, failed clock  Medications: Patient's Medications  New Prescriptions   No medications on file  Previous Medications   ACETAMINOPHEN (TYLENOL) 325 MG TABLET    Take 650 mg by mouth 2 (two) times daily.   ALBUTEROL (ACCUNEB) 1.25 MG/3ML NEBULIZER SOLUTION    Take 1 ampule by nebulization every 6 (six) hours as needed for wheezing.   ASPIRIN EC 81 MG TABLET    Take 1 tablet (81 mg total) by mouth daily.   ATORVASTATIN (LIPITOR) 10 MG TABLET    Take one tablet daily   BUPROPION (WELLBUTRIN XL) 150 MG 24 HR TABLET    Take 150 mg by mouth daily.   CALCIUM CARBONATE (OS-CAL) 600 MG TABS TABLET    Take 600 mg by mouth daily with breakfast.   CHOLECALCIFEROL (VITAMIN D) 1000 UNITS TABLET    Take 1,000 Units by mouth daily.   DILTIAZEM CD 240 MG 24 HR CAPSULE    Take one tablet daily   DORZOLAMIDE-TIMOLOL (COSOPT) 22.3-6.8 MG/ML OPHTHALMIC SOLUTION    Place 1 drop into both eyes 2 (two) times daily.   DULOXETINE (CYMBALTA) 30 MG CAPSULE    Take 30 mg by mouth 2 (two) times daily.   LEVOTHYROXINE (SYNTHROID, LEVOTHROID) 50 MCG TABLET    Take 50 mcg by mouth every morning. Before breakfast   LORAZEPAM (ATIVAN) 0.5 MG TABLET    Take 0.25 mg by mouth every 6 (six) hours as needed for anxiety.   MONTELUKAST (SINGULAIR) 10 MG  TABLET    Take 10 mg by mouth at bedtime.   MULTIPLE VITAMINS-MINERALS (PRESERVISION/LUTEIN) CAPS    Take 2 capsules by mouth daily.   OMEPRAZOLE (PRILOSEC) 20 MG CAPSULE    Take 20 mg by mouth daily.    POLYETHYLENE GLYCOL (MIRALAX / GLYCOLAX) PACKET    Take 17 g by mouth daily. Mix with 6oz beverage of choice. Hold for loose stool   TERIPARATIDE, RECOMBINANT, (FORTEO) 600 MCG/2.4ML SOLN    Inject 20 mcg into the skin every morning.   TRAVOPROST, BAK FREE, (TRAVATAN  Z) 0.004 % SOLN OPHTHALMIC SOLUTION    1 drop at bedtime.  Modified Medications   No medications on file  Discontinued Medications   No medications on file     Physical Exam:  Filed Vitals:   12/06/14 1610  BP: 120/80  Pulse: 76  Temp: 97.6 F (36.4 C)  Resp: 18  SpO2: 96%   Physical Exam  Constitutional: No distress.  Frail, short stature  HENT:  Head: Normocephalic and atraumatic.  Right Ear: External ear normal.  Left Ear: External ear normal.  Mouth/Throat: No oropharyngeal exudate.  Eyes: Conjunctivae and EOM are normal. Pupils are equal, round, and reactive to light.  Neck: No JVD present. No thyromegaly present.  Decreased ROM to neck  Cardiovascular: Normal rate and normal heart sounds.   No murmur heard. Irregular rhythm  Pulmonary/Chest: Effort normal and breath sounds normal. No stridor. No respiratory distress.  Abdominal: Soft. Bowel sounds are normal.  Musculoskeletal: Normal range of motion. She exhibits no edema or tenderness.  Kyphosis. Ambulates with a walker  Lymphadenopathy:    She has no cervical adenopathy.  Neurological: She is alert. No cranial nerve deficit. Gait normal. Coordination normal.  Oriented to self and place, but forgetful on the details of her care and the date. Steady gait with walker  Skin: Skin is warm and dry. She is not diaphoretic.  Ecchymoses to left hip  Psychiatric: Affect normal.     Labs reviewed/Significant Diagnostic Results:  PCXR 06/28/14: cardiomegaly without evidence of CHF, negative for pna  06/14/14:Barium swallow was performed on 06/14/14 that showed decreased esophageal motility.  01/16/14: Capsule endoscopy: normal study, no GI source of anemia or intermittent heme pos stool  Basic Metabolic Panel:  Recent Labs  06/21/14 09/26/14  NA 136* 142  K 3.8  12.6* 3.7  BUN 24* 16  CREATININE 0.8 0.7   CBC:  Recent Labs  06/21/14 06/26/14 10/06/14  WBC 12.8 6.4 7.9  HGB 12.6 12.3 13.0  HCT 36 37 37    PLT 212 242 264   CBG: No results for input(s): GLUCAP in the last 8760 hours. TSH:  Recent Labs  06/26/14 08/21/14 10/06/14  TSH 6.26* 0.04* 0.24*   A1C: Lab Results  Component Value Date   HGBA1C 5.5 12/20/2012   06/21/14: BNP 522.6 Lab Results  Component Value Date   CHOL 120 07/26/2014   HDL 40 07/26/2014   LDLCALC 63 07/26/2014   TRIG 85 07/26/2014   CHOLHDL 3.5 12/20/2012    Assessment/Plan  1. Left hip pain Check left hip xray to rule out fx  2) Viral illness Monitor VS and encourage fluid.     Cindi Carbon, ANP [facility] [phone]

## 2014-12-17 ENCOUNTER — Encounter: Payer: Self-pay | Admitting: Internal Medicine

## 2015-01-03 ENCOUNTER — Encounter (HOSPITAL_COMMUNITY): Payer: Self-pay | Admitting: Emergency Medicine

## 2015-01-03 ENCOUNTER — Emergency Department (HOSPITAL_COMMUNITY)
Admission: EM | Admit: 2015-01-03 | Discharge: 2015-01-04 | Disposition: A | Discharge status: Home / Self Care | Payer: Self-pay | Attending: Emergency Medicine | Admitting: Emergency Medicine

## 2015-01-03 DIAGNOSIS — Z8673 Personal history of transient ischemic attack (TIA), and cerebral infarction without residual deficits: Secondary | ICD-10-CM | POA: Insufficient documentation

## 2015-01-03 DIAGNOSIS — I10 Essential (primary) hypertension: Secondary | ICD-10-CM | POA: Insufficient documentation

## 2015-01-03 DIAGNOSIS — Z862 Personal history of diseases of the blood and blood-forming organs and certain disorders involving the immune mechanism: Secondary | ICD-10-CM | POA: Insufficient documentation

## 2015-01-03 DIAGNOSIS — Z8739 Personal history of other diseases of the musculoskeletal system and connective tissue: Secondary | ICD-10-CM | POA: Insufficient documentation

## 2015-01-03 DIAGNOSIS — R112 Nausea with vomiting, unspecified: Secondary | ICD-10-CM | POA: Insufficient documentation

## 2015-01-03 DIAGNOSIS — R197 Diarrhea, unspecified: Secondary | ICD-10-CM | POA: Insufficient documentation

## 2015-01-03 DIAGNOSIS — M542 Cervicalgia: Secondary | ICD-10-CM | POA: Insufficient documentation

## 2015-01-03 DIAGNOSIS — F0390 Unspecified dementia without behavioral disturbance: Secondary | ICD-10-CM | POA: Insufficient documentation

## 2015-01-03 DIAGNOSIS — Z8719 Personal history of other diseases of the digestive system: Secondary | ICD-10-CM | POA: Insufficient documentation

## 2015-01-03 DIAGNOSIS — Z8669 Personal history of other diseases of the nervous system and sense organs: Secondary | ICD-10-CM | POA: Insufficient documentation

## 2015-01-03 DIAGNOSIS — Z8639 Personal history of other endocrine, nutritional and metabolic disease: Secondary | ICD-10-CM | POA: Insufficient documentation

## 2015-01-03 HISTORY — DX: Cerebral infarction due to embolism of unspecified precerebral artery: I63.10

## 2015-01-03 HISTORY — DX: Unspecified dementia, unspecified severity, without behavioral disturbance, psychotic disturbance, mood disturbance, and anxiety: F03.90

## 2015-01-03 HISTORY — DX: Hypothyroidism, unspecified: E03.9

## 2015-01-03 HISTORY — DX: Mixed hyperlipidemia: E78.2

## 2015-01-03 HISTORY — DX: Iron deficiency anemia secondary to blood loss (chronic): D50.0

## 2015-01-03 HISTORY — DX: Diarrhea, unspecified: R19.7

## 2015-01-03 HISTORY — DX: Unspecified abnormalities of gait and mobility: R26.9

## 2015-01-03 HISTORY — DX: Long term (current) use of anticoagulants: Z79.01

## 2015-01-03 HISTORY — DX: Hemorrhage of anus and rectum: K62.5

## 2015-01-03 HISTORY — DX: Dysphagia, unspecified: R13.10

## 2015-01-03 HISTORY — DX: Low back pain: M54.5

## 2015-01-03 HISTORY — DX: Chronic atrial fibrillation, unspecified: I48.20

## 2015-01-03 HISTORY — DX: Essential (primary) hypertension: I10

## 2015-01-03 HISTORY — DX: Age-related osteoporosis without current pathological fracture: M81.0

## 2015-01-03 HISTORY — DX: Cervicalgia: M54.2

## 2015-01-03 HISTORY — DX: Anxiety disorder, unspecified: F41.9

## 2015-01-03 HISTORY — DX: Unspecified macular degeneration: H35.30

## 2015-01-03 HISTORY — DX: Low back pain, unspecified: M54.50

## 2015-01-03 HISTORY — DX: Unspecified open-angle glaucoma, stage unspecified: H40.10X0

## 2015-01-03 LAB — URINE MICROSCOPIC-ADD ON

## 2015-01-03 LAB — URINALYSIS, ROUTINE W REFLEX MICROSCOPIC
BILIRUBIN URINE: NEGATIVE
Glucose, UA: NEGATIVE mg/dL
HGB URINE DIPSTICK: NEGATIVE
KETONES UR: NEGATIVE mg/dL
LEUKOCYTES UA: NEGATIVE
Nitrite: NEGATIVE
PROTEIN: 30 mg/dL — AB
Specific Gravity, Urine: 1.037 — ABNORMAL HIGH (ref 1.005–1.030)
Urobilinogen, UA: 0.2 mg/dL (ref 0.0–1.0)
pH: 5 (ref 5.0–8.0)

## 2015-01-03 LAB — CBC WITH DIFFERENTIAL/PLATELET
Basophils Absolute: 0 10*3/uL (ref 0.0–0.1)
Basophils Relative: 0 % (ref 0–1)
EOS ABS: 0.1 10*3/uL (ref 0.0–0.7)
Eosinophils Relative: 1 % (ref 0–5)
HCT: 42.4 % (ref 36.0–46.0)
HEMOGLOBIN: 13.6 g/dL (ref 12.0–15.0)
LYMPHS ABS: 1.3 10*3/uL (ref 0.7–4.0)
Lymphocytes Relative: 9 % — ABNORMAL LOW (ref 12–46)
MCH: 32.9 pg (ref 26.0–34.0)
MCHC: 32.1 g/dL (ref 30.0–36.0)
MCV: 102.4 fL — AB (ref 78.0–100.0)
MONOS PCT: 6 % (ref 3–12)
Monocytes Absolute: 0.9 10*3/uL (ref 0.1–1.0)
NEUTROS ABS: 12.6 10*3/uL — AB (ref 1.7–7.7)
Neutrophils Relative %: 84 % — ABNORMAL HIGH (ref 43–77)
Platelets: 293 10*3/uL (ref 150–400)
RBC: 4.14 MIL/uL (ref 3.87–5.11)
RDW: 13.8 % (ref 11.5–15.5)
WBC: 14.9 10*3/uL — ABNORMAL HIGH (ref 4.0–10.5)

## 2015-01-03 LAB — COMPREHENSIVE METABOLIC PANEL
ALBUMIN: 4.1 g/dL (ref 3.5–5.0)
ALT: 12 U/L — ABNORMAL LOW (ref 14–54)
AST: 37 U/L (ref 15–41)
Alkaline Phosphatase: 118 U/L (ref 38–126)
Anion gap: 13 (ref 5–15)
BUN: 25 mg/dL — ABNORMAL HIGH (ref 6–20)
CO2: 22 mmol/L (ref 22–32)
CREATININE: 0.83 mg/dL (ref 0.44–1.00)
Calcium: 9.2 mg/dL (ref 8.9–10.3)
Chloride: 106 mmol/L (ref 101–111)
GFR calc non Af Amer: >60 mL/min (ref >60)
GLUCOSE: 118 mg/dL — AB (ref 65–99)
Potassium: 3.9 mmol/L (ref 3.5–5.1)
SODIUM: 141 mmol/L (ref 135–145)
Total Bilirubin: 0.8 mg/dL (ref 0.3–1.2)
Total Protein: 7.8 g/dL (ref 6.5–8.1)

## 2015-01-03 LAB — I-STAT TROPONIN, ED: Troponin i, poc: 0.02 ng/mL (ref 0.00–0.08)

## 2015-01-03 LAB — LIPASE, BLOOD: Lipase: 39 U/L (ref 22–51)

## 2015-01-03 LAB — I-STAT CG4 LACTIC ACID, ED
Lactic Acid, Venous: 1.18 mmol/L (ref 0.5–2.0)
Lactic Acid, Venous: 2.77 mmol/L (ref 0.5–2.0)

## 2015-01-03 MED ORDER — SODIUM CHLORIDE 0.9 % IV SOLN
INTRAVENOUS | Status: DC
  Administered 2015-01-03: 21:00:00 via INTRAVENOUS

## 2015-01-03 MED ORDER — ONDANSETRON 4 MG PO TBDP: 4.0000 mg | ORAL_TABLET | Freq: Three times a day (TID) | ORAL | Status: AC | PRN

## 2015-01-03 MED ORDER — ONDANSETRON HCL 4 MG/2ML IJ SOLN
4.0000 mg | Freq: Once | INTRAMUSCULAR | Status: DC
  Filled 2015-01-03: qty 2

## 2015-01-03 MED ORDER — SODIUM CHLORIDE 0.9 % IV BOLUS (SEPSIS)
1000.0000 mL | Freq: Once | INTRAVENOUS | Status: AC
  Administered 2015-01-03: 1000 mL via INTRAVENOUS

## 2015-01-03 NOTE — ED Notes (Signed)
Pt comes from [place], hx of dementia, staff states that patient is not acting herself and started vomiting this evening.

## 2015-01-03 NOTE — ED Provider Notes (Signed)
TIME SEEN: 8:15 PM  CHIEF COMPLAINT: Vomiting  HPI: Pt is a 81 y.o. female with history of chronic atrial fibrillation, hypertension, hyperlipidemia, hypothyroidism, dementia who lives at [facility] who presents to the emergency department with episodes of nonbloody, nonbilious vomiting that occurred prior to arrival. She is also had loose stool in the emergency department. Denies any abdominal pain. No chest pain or shortness of breath. No known fever. Denies dysuria. History is limited given patient's dementia. Most of the history is obtained from patient's family at bedside. They deny that she's ever had any abdominal surgeries. No known sick contacts. No recent travel.  ROS: Level V caveat for dementia  PAST MEDICAL HISTORY/PAST SURGICAL HISTORY:  Past Medical History  Diagnosis Date  . Dysphagia     pharyngoesophageal phase  . Diarrhea   . Chronic atrial fibrillation   . Cervicalgia   . Low back pain   . Hemorrhage of anus and rectum   . Iron deficiency anemia due to chronic blood loss   . Anemia   . Cerebral infarction due to embolism of precerebral artery   . Gait abnormality   . Glaucoma, open angle   . Hypertension   . Long term current use of anticoagulant   . Anxiety disorder   . Mixed hyperlipidemia   . Hypothyroidism   . Dementia   . Macular degeneration   . Age related osteoporosis     MEDICATIONS:  Prior to Admission medications   Not on File    ALLERGIES:  No Known Allergies  SOCIAL HISTORY:  History  Substance Use Topics  . Smoking status: Not on file  . Smokeless tobacco: Not on file  . Alcohol Use: Not on file    FAMILY HISTORY: No family history on file.  EXAM: BP 140/96 mmHg  Pulse 63  Temp(Src) 97.6 F (36.4 C) (Oral)  Resp 20  SpO2 98% CONSTITUTIONAL: Alert and oriented to person and place but not time and responds appropriately to questions. Well-appearing; well-nourished, elderly, in no distress, nontoxic, smiling, pleasant HEAD:  Normocephalic EYES: Conjunctivae clear, PERRL ENT: normal nose; no rhinorrhea; moist mucous membranes; pharynx without lesions noted NECK: Supple, no meningismus, no LAD  CARD: Irregularly irregular; S1 and S2 appreciated; no murmurs, no clicks, no rubs, no gallops RESP: Normal chest excursion without splinting or tachypnea; breath sounds clear and equal bilaterally; no wheezes, no rhonchi, no rales, no hypoxia or respiratory distress, speaking full sentences ABD/GI: Normal bowel sounds; non-distended; soft, non-tender, no rebound, no guarding, no peritoneal signs, negative Murphy sign, no tenderness at McBurney's point BACK:  The back appears normal and is non-tender to palpation, there is no CVA tenderness EXT: Normal ROM in all joints; non-tender to palpation; no edema; normal capillary refill; no cyanosis, no calf tenderness or swelling    SKIN: Normal color for age and race; warm NEURO: Moves all extremities equally, sensation to light touch intact diffusely, cranial nerves II through XII intact PSYCH: The patient's mood and manner are appropriate. Grooming and personal hygiene are appropriate.  MEDICAL DECISION MAKING: Patient here with vomiting that occurred prior to arrival. Also has had loose stools in the emergency department without blood or melanotic. Abdominal exam is benign. Hemodynamically stable, afebrile. EKG shows atrial fibrillation with nonspecific T-wave changes with no old for comparison. Will obtain labs, urine. At this time I do not feel she needs CT imaging.  ED PROGRESS: Patient's labs show lactate of 2.77, leukocytosis of 14.9 with left shift. Electrolytes within normal limits.  Troponin negative. LFTs and lipase normal. She saw has a completely benign abdominal exam and has been able to drink a full cup of water without difficulty, vomiting.  Repeat abdominal exam is still completely benign. We'll continue IV hydration and repeat lactate. Urine pending.    11:45 PM  Pt's  abdominal exam still benign. She is still hemodynamically stable. Repeat lactate has improved. No further vomiting or diarrhea in the ED. Possible viral illness. Discussed strict return precautions. Family and patient are comfortable with plan to be discharged home. We'll discharge with prescription for Zofran.     EKG Interpretation  Date/Time:  Thursday Jan 03 2015 20:49:06 EDT Ventricular Rate:  87 PR Interval:    QRS Duration: 90 QT Interval:  329 QTC Calculation: 396 R Axis:   17 Text Interpretation:  Atrial fibrillation Borderline T abnormalities, diffuse leads No old tracing to compare Confirmed by WARD,  DO, KRISTEN [phone]) on 01/03/2015 8:50:54 PM        [place], DO 01/03/15 2345

## 2015-01-03 NOTE — Progress Notes (Signed)
Patient noted to be from [facility].  Patient's pcp listed at the nursing facility is Dr. Hollace Kinnier.  System updated.

## 2015-01-03 NOTE — ED Notes (Signed)
Bed: GN00 Expected date:  Expected time:  Means of arrival:  Comments: EMS/elderly/n/v

## 2015-01-03 NOTE — Discharge Instructions (Signed)

## 2015-01-03 NOTE — ED Notes (Signed)
edp Ward and nurse notified of critical lab results

## 2015-01-03 NOTE — ED Notes (Signed)
EKG given to EDP, Ward,MD., for review. 

## 2015-01-03 NOTE — Progress Notes (Signed)
CSW met with pt at bedside. However, she was not effectively communicative. Per note, the pt has a history of dementia.  Per note, staff states that pt is not acting herself and started vomiting this evening.   CSW consulted with nurse who confirms that the pt is from [facility]. Nurse called registration to reach out to family. However, registration informed nurse that the family is not responding to call.   Willette Brace [phone] ED CSW 01/03/2015 10:51 PM

## 2015-01-12 ENCOUNTER — Emergency Department (HOSPITAL_COMMUNITY)
Admission: EM | Admit: 2015-01-12 | Discharge: 2015-01-12 | Disposition: A | Discharge status: Home / Self Care | Payer: Medicare PPO | Attending: Emergency Medicine | Admitting: Emergency Medicine

## 2015-01-12 ENCOUNTER — Emergency Department (HOSPITAL_COMMUNITY): Payer: Medicare PPO

## 2015-01-12 ENCOUNTER — Encounter (HOSPITAL_COMMUNITY): Payer: Self-pay | Admitting: Family Medicine

## 2015-01-12 DIAGNOSIS — E039 Hypothyroidism, unspecified: Secondary | ICD-10-CM | POA: Diagnosis not present

## 2015-01-12 DIAGNOSIS — Z9181 History of falling: Secondary | ICD-10-CM | POA: Insufficient documentation

## 2015-01-12 DIAGNOSIS — Z862 Personal history of diseases of the blood and blood-forming organs and certain disorders involving the immune mechanism: Secondary | ICD-10-CM | POA: Insufficient documentation

## 2015-01-12 DIAGNOSIS — Y998 Other external cause status: Secondary | ICD-10-CM | POA: Diagnosis not present

## 2015-01-12 DIAGNOSIS — Z79899 Other long term (current) drug therapy: Secondary | ICD-10-CM | POA: Insufficient documentation

## 2015-01-12 DIAGNOSIS — S79911A Unspecified injury of right hip, initial encounter: Secondary | ICD-10-CM | POA: Diagnosis present

## 2015-01-12 DIAGNOSIS — M1990 Unspecified osteoarthritis, unspecified site: Secondary | ICD-10-CM | POA: Diagnosis not present

## 2015-01-12 DIAGNOSIS — E559 Vitamin D deficiency, unspecified: Secondary | ICD-10-CM | POA: Insufficient documentation

## 2015-01-12 DIAGNOSIS — Y9389 Activity, other specified: Secondary | ICD-10-CM | POA: Insufficient documentation

## 2015-01-12 DIAGNOSIS — Z87891 Personal history of nicotine dependence: Secondary | ICD-10-CM | POA: Insufficient documentation

## 2015-01-12 DIAGNOSIS — Z8701 Personal history of pneumonia (recurrent): Secondary | ICD-10-CM | POA: Diagnosis not present

## 2015-01-12 DIAGNOSIS — I4891 Unspecified atrial fibrillation: Secondary | ICD-10-CM | POA: Diagnosis not present

## 2015-01-12 DIAGNOSIS — Y9289 Other specified places as the place of occurrence of the external cause: Secondary | ICD-10-CM | POA: Diagnosis not present

## 2015-01-12 DIAGNOSIS — Z8673 Personal history of transient ischemic attack (TIA), and cerebral infarction without residual deficits: Secondary | ICD-10-CM | POA: Insufficient documentation

## 2015-01-12 DIAGNOSIS — W1839XA Other fall on same level, initial encounter: Secondary | ICD-10-CM | POA: Insufficient documentation

## 2015-01-12 DIAGNOSIS — Z8669 Personal history of other diseases of the nervous system and sense organs: Secondary | ICD-10-CM | POA: Diagnosis not present

## 2015-01-12 DIAGNOSIS — F411 Generalized anxiety disorder: Secondary | ICD-10-CM | POA: Diagnosis not present

## 2015-01-12 DIAGNOSIS — Z7982 Long term (current) use of aspirin: Secondary | ICD-10-CM | POA: Insufficient documentation

## 2015-01-12 DIAGNOSIS — F0390 Unspecified dementia without behavioral disturbance: Secondary | ICD-10-CM | POA: Insufficient documentation

## 2015-01-12 DIAGNOSIS — Z7901 Long term (current) use of anticoagulants: Secondary | ICD-10-CM | POA: Insufficient documentation

## 2015-01-12 DIAGNOSIS — R52 Pain, unspecified: Secondary | ICD-10-CM

## 2015-01-12 DIAGNOSIS — W19XXXA Unspecified fall, initial encounter: Secondary | ICD-10-CM

## 2015-01-12 MED ORDER — ACETAMINOPHEN 325 MG PO TABS
650.0000 mg | ORAL_TABLET | Freq: Once | ORAL | Status: AC
  Administered 2015-01-12: 650 mg via ORAL
  Filled 2015-01-12 (×2): qty 2

## 2015-01-12 NOTE — ED Notes (Signed)
Dr. J at bedside 

## 2015-01-12 NOTE — ED Notes (Signed)
Pt says that the 650mg  of tylenol will not "touch my pain." Dr. Lenna Sciara informed.

## 2015-01-12 NOTE — Discharge Instructions (Signed)
CT scan of the pelvis showed a bruise around the right hip but nothing broken. X-ray of the left ankle showed a tiny break. Does not require further treatment. Ms. Barbara [name] can take Tylenol 650 mg every 4 hours as needed for pain. Follow-up with Dr. Mariea Clonts if having significant pain by next week

## 2015-01-12 NOTE — ED Notes (Signed)
Pt reports her right foot feels like it is on fire. Dr. Lenna Sciara informed.

## 2015-01-12 NOTE — ED Notes (Signed)
Pt has changed her mind and would like the tylenol for pain.

## 2015-01-12 NOTE — ED Provider Notes (Signed)
CSN: [id number]     Arrival date & time 01/12/15  1253 History   First MD Initiated Contact with Patient 01/12/15 1304     Chief Complaint  Patient presents with  . Fall  . Hip Pain    Level V caveat dementia history is obtained from paperwork accompanying patient and from her daughter and son-in-law who accompany her and from paramedics (Consider location/radiation/quality/duration/timing/severity/associated sxs/prior Treatment) HPI Patient reportedly fell yesterday at approximate 7:30 PM. She walks with walker, is prone to frequent falls. She reportedly complained of pain at right hip and lower back after the fall. She presently denies pain anywhere. She was brought by EMS. Treated by EMS with heart cervical collar and KED device. Past Medical History  Diagnosis Date  . Atrial fibrillation 2009    Long term anticoagulation. Warfarin changed to Xarelto 06/2012  . Cystocele, midline   . Senile dementia, uncomplicated   . Generalized anxiety disorder   . Unspecified glaucoma   . Macular degeneration (senile) of retina, unspecified   . Osteoarthrosis, unspecified whether generalized or localized, unspecified site   . Senile osteoporosis   . Pneumonia, organism unspecified 03/2012    Tx w/ PO antibx  . Unspecified vitamin D deficiency   . Unspecified venous (peripheral) insufficiency   . Tear film insufficiency, unspecified 08/2012  . Debility, unspecified 04/01/2012  . Long term (current) use of anticoagulants 04/01/2012  . CVA (cerebral infarction) 11/19/2012  . Anemia 11/2012    transfusion 11/2012  . Unspecified hypothyroidism   . Other and unspecified hyperlipidemia     Atorvastatin started 12/2012  . Benign paroxysmal positional vertigo 11/08/2013  . Glaucoma   . Stroke 2014  . Contusion, chest wall 11/27/2013    Left side.   Marland Kitchen History of fall 11/27/2013  . Wound of right leg 11/13/2013    posteriorly    Past Surgical History  Procedure Laterality Date  . Tonsillectomy and  adenoidectomy    . Umbilical hernia repair    . Cataract extraction w/ intraocular lens  implant, bilateral    . Spine surgery      RE; Spinal stenosis  . Esophagogastroduodenoscopy (egd) with propofol N/A 11/21/2013    Procedure: ESOPHAGOGASTRODUODENOSCOPY (EGD) WITH PROPOFOL;  Surgeon: Cleotis Nipper, MD;  Location[facility] ENDOSCOPY;  Service: Endoscopy;  Laterality: N/A;  . Colonoscopy with propofol N/A 11/21/2013    Procedure: COLONOSCOPY WITH PROPOFOL;  Surgeon: Cleotis Nipper, MD;  Location: [facility] ENDOSCOPY;  Service: Endoscopy;  Laterality: N/A;   Family History  Problem Relation Age of Onset  . Heart attack Father    History  Substance Use Topics  . Smoking status: Former Smoker    Types: Cigarettes    Quit date: 03/08/1981  . Smokeless tobacco: Never Used  . Alcohol Use: No   DO NOT RESUSCITATE CODE STATUS OB History    No data available     Review of Systems  Unable to perform ROS: Dementia      Allergies  Aricept  Home Medications   Prior to Admission medications   Medication Sig Start Date End Date Taking? Authorizing Provider  acetaminophen (TYLENOL) 325 MG tablet Take 650 mg by mouth 2 (two) times daily. 12/21/12   Historical Provider, MD  albuterol (ACCUNEB) 1.25 MG/3ML nebulizer solution Take 1 ampule by nebulization every 6 (six) hours as needed for wheezing.    Historical Provider, MD  aspirin EC 81 MG tablet Take 1 tablet (81 mg total) by mouth daily. 10/17/14  Jerline Pain, MD  atorvastatin (LIPITOR) 10 MG tablet Take one tablet daily 01/20/14   Historical Provider, MD  buPROPion (WELLBUTRIN XL) 150 MG 24 hr tablet Take 150 mg by mouth daily.    Historical Provider, MD  calcium carbonate (OS-CAL) 600 MG TABS tablet Take 600 mg by mouth daily with breakfast.    Historical Provider, MD  cholecalciferol (VITAMIN D) 1000 UNITS tablet Take 1,000 Units by mouth daily.    Historical Provider, MD  DILTIAZEM CD 240 MG 24 hr capsule Take one tablet daily 12/27/13    Historical Provider, MD  dorzolamide-timolol (COSOPT) 22.3-6.8 MG/ML ophthalmic solution Place 1 drop into both eyes 2 (two) times daily.    Historical Provider, MD  DULoxetine (CYMBALTA) 30 MG capsule Take 30 mg by mouth 2 (two) times daily. 10/15/14   Historical Provider, MD  levothyroxine (SYNTHROID, LEVOTHROID) 50 MCG tablet Take 50 mcg by mouth every morning. Before breakfast 10/03/14   Historical Provider, MD  LORazepam (ATIVAN) 0.5 MG tablet Take 0.25 mg by mouth every 6 (six) hours as needed for anxiety.    Historical Provider, MD  montelukast (SINGULAIR) 10 MG tablet Take 10 mg by mouth at bedtime.    Historical Provider, MD  Multiple Vitamins-Minerals (PRESERVISION/LUTEIN) CAPS Take 2 capsules by mouth daily.    Historical Provider, MD  omeprazole (PRILOSEC) 20 MG capsule Take 20 mg by mouth daily.     Historical Provider, MD  polyethylene glycol (MIRALAX / GLYCOLAX) packet Take 17 g by mouth daily. Mix with 6oz beverage of choice. Hold for loose stool    Historical Provider, MD  Teriparatide, Recombinant, (FORTEO) 600 MCG/2.4ML SOLN Inject 20 mcg into the skin every morning.    Historical Provider, MD  Travoprost, BAK Free, (TRAVATAN Z) 0.004 % SOLN ophthalmic solution 1 drop at bedtime.    Historical Provider, MD   BP 112/62 mmHg  Pulse 75  Temp(Src) 97.8 F (36.6 C) (Oral)  Resp 15  SpO2 96% Physical Exam  Constitutional: No distress.  HENT:  Head: Normocephalic and atraumatic.  Eyes: Conjunctivae are normal. Pupils are equal, round, and reactive to light.  Neck: Neck supple. No tracheal deviation present. No thyromegaly present.  Cardiovascular: Normal rate.   No murmur heard. Irregularly irregular  Pulmonary/Chest: Effort normal and breath sounds normal.  Abdominal: Soft. Bowel sounds are normal. She exhibits no distension. There is no tenderness.  Musculoskeletal: Normal range of motion. She exhibits no edema or tenderness.  Entire spine nontender. Pelvis stable nontender.  Bilateral hips nontender. She has no pain on internal or external rotation of either thigh. Left lower extremity ecchymotic and swollen over lateral malleolus mildly tender. Proximal medial foot is ecchymotic,, nontender. DP pulse 2+. All other extremities without contusion abrasion or tenderness neurovascularly intact. She flexes both thighs off of the bed easily and without pain  Neurological: She is alert. Coordination normal.  Moves all extremities cranial nerves II through XII intact motor strength 5 over 5 overall . Follow simple commands  Skin: Skin is warm and dry. No rash noted.  Psychiatric: She has a normal mood and affect.  Nursing note and vitals reviewed.   ED Course  Procedures (including critical care time) Labs Review Labs Reviewed - No data to display  Imaging Review No results found.   EKG Interpretation None     1540 5 PM patient complained of right foot pain. On reexamination. Foot is not red warm or tender. Tylenol ordered. X-rays viewed by me Results for orders  placed or performed in visit on 10/29/14  CBC and differential  Result Value Ref Range   Hemoglobin 13.0 12.0 - 16.0 g/dL   HCT 37 36 - 46 %   Platelets 264 150 - 399 K/L   WBC 7.9 10^3/mL  TSH  Result Value Ref Range   TSH 0.24 (A) 0.41 - 5.90 uIU/mL   Dg Lumbar Spine Complete  01/12/2015   CLINICAL DATA:  Fall yesterday.  Low back and left hip pain.  EXAM: LUMBAR SPINE - COMPLETE 4+ VIEW  COMPARISON:  CT pelvis from the same day.  FINDINGS: Five non rib-bearing lumbar type vertebral bodies are present. No acute fracture or traumatic subluxation is present. Grade 1 anterolisthesis at L4-5 is secondary to advanced facet arthropathy. Vertebral body heights and alignment are otherwise maintained. There is a vacuum disc at L5-S1 with chronic loss of disc height. Atherosclerotic calcifications are noted within the aorta and branch vessels without aneurysm. The bowel gas pattern is normal.  IMPRESSION: 1.  Degenerative changes of the lumbar spine with grade 1 anterolisthesis at L4-5. 2. Chronic loss of disc height and facet hypertrophy at L5-S1. 3. No acute abnormality.   Electronically Signed   By: San Morelle M.D.   On: 01/12/2015 14:52   Dg Ankle Complete Left  01/12/2015   CLINICAL DATA:  [place] yesterday, frequent falls, lateral malleolar swelling and bruising extending to arch, sensitive to touch  EXAM: LEFT ANKLE COMPLETE - 3+ VIEW  COMPARISON:  None  FINDINGS: Diffuse osseous demineralization.  Minimally displaced fracture at distal aspect of lateral malleolus.  Ankle mortise appears normally aligned.  Overlying lateral soft tissue swelling.  No additional fracture, dislocation, or bone destruction.  Moderate-sized plantar calcaneal spur.  IMPRESSION: Minimally displaced lateral malleolar fracture with overlying soft tissue swelling.   Electronically Signed   By: Lavonia Dana M.D.   On: 01/12/2015 14:54   Ct Pelvis Wo Contrast  01/12/2015   CLINICAL DATA:  Right lower extremity and hip pain since an unwitnessed fall yesterday.  EXAM: CT PELVIS WITHOUT CONTRAST  TECHNIQUE: Multidetector CT imaging of the pelvis was performed following the standard protocol without intravenous contrast.  COMPARISON:  Radiographs dated 01/12/2015  FINDINGS: There is no acute fracture or dislocation. Old healed fractures of that inferior and superior pubic rami and left pubic body as well as of the posterior superior aspect of the left ilium.  The fatty atrophy of portions of both gluteus medius and minimus muscles with calcific tendinopathy of the distal right gluteus medius tendon.  There is a 2 cm focal hematoma in the subcutaneous fat just lateral to the right greater trochanter with slight edema or bruising in the subcutaneous fat adjacent to the small hematoma.  There is fairly severe degenerative facet arthritis in the lower lumbar spine with grade 1 spondylolisthesis at L4-5 with.  IMPRESSION: No acute osseous  abnormality. Soft tissue contusion lateral to the right greater trochanter with a 2 cm subcutaneous hematoma.   Electronically Signed   By: Lorriane Shire M.D.   On: 01/12/2015 15:32   Dg Foot Complete Left  01/12/2015   CLINICAL DATA:  Frequent falls. Fall yesterday. Left foot and ankle pain.  EXAM: LEFT FOOT - COMPLETE 3+ VIEW  COMPARISON:  Left ankle radiographs of the same day.  FINDINGS: A minimally displaced fracture at the lateral malleolus is better seen on the ankle films. Ossification medial to the navicular bone likely represents a secondary center of ossification. Advanced degenerative changes are  noted at the first MTP joint. This may be related to a remote trauma. Mild osteopenia is present. A calcaneal plantar spur is noted. No other acute fracture is present in the foot.  IMPRESSION: 1. Acute minimally displaced fracture of the lateral malleolus. 2. Osteopenia. 3. Advanced degenerative changes at the first MTP joint.   Electronically Signed   By: San Morelle M.D.   On: 01/12/2015 14:54    MDM  Daughter states that ankle fracture left side is old. She had prior x-rays done portably at [facility]. Fracture of lateral malleolus a small does not require further care. I suspect that pain and right foot is neuropathic. Suggest tylenol Tylenol for pain. Diagnosis #1 fall #2 closed fracture of left ankle #3 multiple contusions #4 right foot pain Final diagnoses:  Pain        Orlie Dakin, MD 01/12/15 [phone]

## 2015-01-12 NOTE — ED Notes (Signed)
Pt presents from [facility] via [facility]. Patient had unwitnessed fall yesterday, today she began complaining of RLE pain, right hip pain, and is "dragging her right foot"  -- all symptoms occur on ambulation only.  Pt with history of Dementia and is oriented to self only. Per EMS Patient is at her neuro baseline.

## 2015-02-22 ENCOUNTER — Encounter (HOSPITAL_COMMUNITY): Payer: Self-pay | Admitting: Emergency Medicine

## 2015-02-22 ENCOUNTER — Emergency Department (HOSPITAL_COMMUNITY)
Admission: EM | Admit: 2015-02-22 | Discharge: 2015-02-23 | Disposition: A | Discharge status: Home / Self Care | Payer: Medicare PPO | Attending: Emergency Medicine | Admitting: Emergency Medicine

## 2015-02-22 ENCOUNTER — Emergency Department (HOSPITAL_COMMUNITY): Payer: Medicare PPO

## 2015-02-22 DIAGNOSIS — W01198A Fall on same level from slipping, tripping and stumbling with subsequent striking against other object, initial encounter: Secondary | ICD-10-CM | POA: Insufficient documentation

## 2015-02-22 DIAGNOSIS — Z862 Personal history of diseases of the blood and blood-forming organs and certain disorders involving the immune mechanism: Secondary | ICD-10-CM | POA: Diagnosis not present

## 2015-02-22 DIAGNOSIS — F0390 Unspecified dementia without behavioral disturbance: Secondary | ICD-10-CM | POA: Insufficient documentation

## 2015-02-22 DIAGNOSIS — Z8673 Personal history of transient ischemic attack (TIA), and cerebral infarction without residual deficits: Secondary | ICD-10-CM | POA: Diagnosis not present

## 2015-02-22 DIAGNOSIS — S0990XA Unspecified injury of head, initial encounter: Secondary | ICD-10-CM

## 2015-02-22 DIAGNOSIS — Z79899 Other long term (current) drug therapy: Secondary | ICD-10-CM | POA: Diagnosis not present

## 2015-02-22 DIAGNOSIS — Z87891 Personal history of nicotine dependence: Secondary | ICD-10-CM | POA: Diagnosis not present

## 2015-02-22 DIAGNOSIS — Y999 Unspecified external cause status: Secondary | ICD-10-CM | POA: Insufficient documentation

## 2015-02-22 DIAGNOSIS — H409 Unspecified glaucoma: Secondary | ICD-10-CM | POA: Insufficient documentation

## 2015-02-22 DIAGNOSIS — E559 Vitamin D deficiency, unspecified: Secondary | ICD-10-CM | POA: Diagnosis not present

## 2015-02-22 DIAGNOSIS — Y939 Activity, unspecified: Secondary | ICD-10-CM | POA: Insufficient documentation

## 2015-02-22 DIAGNOSIS — Y92128 Other place in nursing home as the place of occurrence of the external cause: Secondary | ICD-10-CM | POA: Diagnosis not present

## 2015-02-22 DIAGNOSIS — S81811A Laceration without foreign body, right lower leg, initial encounter: Secondary | ICD-10-CM | POA: Diagnosis not present

## 2015-02-22 DIAGNOSIS — M810 Age-related osteoporosis without current pathological fracture: Secondary | ICD-10-CM | POA: Diagnosis not present

## 2015-02-22 DIAGNOSIS — Z7982 Long term (current) use of aspirin: Secondary | ICD-10-CM | POA: Diagnosis not present

## 2015-02-22 DIAGNOSIS — I4891 Unspecified atrial fibrillation: Secondary | ICD-10-CM | POA: Insufficient documentation

## 2015-02-22 DIAGNOSIS — S0993XA Unspecified injury of face, initial encounter: Secondary | ICD-10-CM | POA: Diagnosis present

## 2015-02-22 DIAGNOSIS — W19XXXA Unspecified fall, initial encounter: Secondary | ICD-10-CM

## 2015-02-22 NOTE — ED Provider Notes (Signed)
CSN: [id number]     Arrival date & time 02/22/15  2127 History   First MD Initiated Contact with Patient 02/22/15 2323     Chief Complaint  Patient presents with  . Fall  . Facial Injury     (Consider location/radiation/quality/duration/timing/severity/associated sxs/prior Treatment) Patient is a 81 y.o. female presenting with fall and facial injury. The history is provided by the patient. No language interpreter was used.  Fall  Facial Injury Level V caveat. Barbara [name] is an 81 y.o female with a history of dementia, A. fib, CVA, stroke who presents by EMS from [facility] after fall and facial injury while using her walker down the hall. She hit her face on the side molding on the wall and sustained a hematoma to her nose without bleeding. She denies any loss of consciousness. She had a small right lower leg laceration. The wound was bandaged prior to coming to the ED. She denies being in [name].   Past Medical History  Diagnosis Date  . Atrial fibrillation 2009    Long term anticoagulation. Warfarin changed to Xarelto 06/2012  . Cystocele, midline   . Senile dementia, uncomplicated   . Generalized anxiety disorder   . Unspecified glaucoma   . Macular degeneration (senile) of retina, unspecified   . Osteoarthrosis, unspecified whether generalized or localized, unspecified site   . Senile osteoporosis   . Pneumonia, organism unspecified 03/2012    Tx w/ PO antibx  . Unspecified vitamin D deficiency   . Unspecified venous (peripheral) insufficiency   . Tear film insufficiency, unspecified 08/2012  . Debility, unspecified 04/01/2012  . Long term (current) use of anticoagulants 04/01/2012  . CVA (cerebral infarction) 11/19/2012  . Anemia 11/2012    transfusion 11/2012  . Unspecified hypothyroidism   . Other and unspecified hyperlipidemia     Atorvastatin started 12/2012  . Benign paroxysmal positional vertigo 11/08/2013  . Glaucoma   . Stroke 2014  . Contusion, chest  wall 11/27/2013    Left side.   Marland Kitchen History of fall 11/27/2013  . Wound of right leg 11/13/2013    posteriorly    Past Surgical History  Procedure Laterality Date  . Tonsillectomy and adenoidectomy    . Umbilical hernia repair    . Cataract extraction w/ intraocular lens  implant, bilateral    . Spine surgery      RE; Spinal stenosis  . Esophagogastroduodenoscopy (egd) with propofol N/A 11/21/2013    Procedure: ESOPHAGOGASTRODUODENOSCOPY (EGD) WITH PROPOFOL;  Surgeon: Cleotis Nipper, MD;  Location: [facility] ENDOSCOPY;  Service: Endoscopy;  Laterality: N/A;  . Colonoscopy with propofol N/A 11/21/2013    Procedure: COLONOSCOPY WITH PROPOFOL;  Surgeon: Cleotis Nipper, MD;  Location: [facility] ENDOSCOPY;  Service: Endoscopy;  Laterality: N/A;   Family History  Problem Relation Age of Onset  . Heart attack Father    History  Substance Use Topics  . Smoking status: Former Smoker    Types: Cigarettes    Quit date: 03/08/1981  . Smokeless tobacco: Never Used  . Alcohol Use: No   OB History    No data available     Review of Systems  Unable to perform ROS: Dementia      Allergies  Aricept  Home Medications   Prior to Admission medications   Medication Sig Start Date End Date Taking? Authorizing Provider  acetaminophen (TYLENOL) 325 MG tablet Take 650 mg by mouth 3 (three) times daily.  12/21/12  Yes Historical Provider, MD  aspirin  EC 81 MG tablet Take 1 tablet (81 mg total) by mouth daily. 10/17/14  Yes Barbara Pain, MD  aspirin EC 81 MG tablet Take 81 mg by mouth daily.   Yes Historical Provider, MD  atorvastatin (LIPITOR) 10 MG tablet Take 10 mg by mouth daily at 6 PM. Take one tablet daily 01/20/14  Yes Historical Provider, MD  cholecalciferol (VITAMIN D) 1000 UNITS tablet Take 1,000 Units by mouth daily.   Yes Historical Provider, MD  dorzolamide-timolol (COSOPT) 22.3-6.8 MG/ML ophthalmic solution Place 1 drop into both eyes 2 (two) times daily.   Yes Historical Provider, MD  DULoxetine  (CYMBALTA) 30 MG capsule Take 30 mg by mouth 2 (two) times daily. 10/15/14  Yes Historical Provider, MD  levothyroxine (SYNTHROID, LEVOTHROID) 75 MCG tablet Take 75 mcg by mouth daily before breakfast.   Yes Historical Provider, MD  memantine (NAMENDA XR) 28 MG CP24 24 hr capsule Take 28 mg by mouth daily.   Yes Historical Provider, MD  montelukast (SINGULAIR) 10 MG tablet Take 10 mg by mouth at bedtime.   Yes Historical Provider, MD  Multiple Vitamins-Minerals (PRESERVISION/LUTEIN) CAPS Take 2 capsules by mouth daily.   Yes Historical Provider, MD  omeprazole (PRILOSEC) 20 MG capsule Take 20 mg by mouth daily.    Yes Historical Provider, MD  ondansetron (ZOFRAN-ODT) 4 MG disintegrating tablet Take 4 mg by mouth every 8 (eight) hours as needed for nausea or vomiting.   Yes Historical Provider, MD  polyethylene glycol (MIRALAX / GLYCOLAX) packet Take 17 g by mouth daily. Mix with 6oz beverage of choice. Hold for loose stool   Yes Historical Provider, MD  Teriparatide, Recombinant, (FORTEO) 600 MCG/2.4ML SOLN Inject 20 mcg into the skin every morning.   Yes Historical Provider, MD  Travoprost, BAK Free, (TRAVATAN Z) 0.004 % SOLN ophthalmic solution Place 1 drop into both eyes daily.    Yes Historical Provider, MD  albuterol (ACCUNEB) 1.25 MG/3ML nebulizer solution Take 1 ampule by nebulization every 6 (six) hours as needed for wheezing.    Historical Provider, MD  buPROPion (WELLBUTRIN XL) 150 MG 24 hr tablet Take 150 mg by mouth daily.    Historical Provider, MD  calcium carbonate (OS-CAL) 600 MG TABS tablet Take 600 mg by mouth daily with breakfast.    Historical Provider, MD  DILTIAZEM CD 240 MG 24 hr capsule Take 240 mg by mouth daily. Take one tablet daily 12/27/13   Historical Provider, MD  levothyroxine (SYNTHROID, LEVOTHROID) 50 MCG tablet Take 50 mcg by mouth every morning. Before breakfast 10/03/14   Historical Provider, MD  LORazepam (ATIVAN) 0.5 MG tablet Take 0.25 mg by mouth every 6 (six)  hours as needed for anxiety.    Historical Provider, MD   BP 114/70 mmHg  Pulse 92  Temp(Src) 98 F (36.7 C) (Oral)  Resp 18  SpO2 96% Physical Exam  Constitutional: She is oriented to person, place, and time. She appears well-developed and well-nourished.  HENT:  Head: Normocephalic. Head is without raccoon's eyes, without Battle's sign, without abrasion and without laceration. Hair is normal.  Contusion and ecchymosis of the nose.  Eyes: Conjunctivae and EOM are normal. Pupils are equal, round, and reactive to light.  Neck: Normal range of motion. Neck supple.  Cardiovascular: Normal rate, regular rhythm and normal heart sounds.   Pulmonary/Chest: Effort normal and breath sounds normal. No respiratory distress. She has no wheezes. She has no rales. She exhibits no tenderness.  Abdominal: Soft. She exhibits no distension. There  is no tenderness. There is no rebound and no guarding.  Musculoskeletal: Normal range of motion. She exhibits no edema or tenderness.  Neurological: She is alert and oriented to person, place, and time.  Skin: Skin is warm and dry. Laceration noted.  2 cm right lower leg superficial linear skin laceration that was covered with Steri-Strips prior to my evaluation. No active bleeding.    Psychiatric: She has a normal mood and affect. Her behavior is normal.    ED Course  Procedures (including critical care time) Labs Review Labs Reviewed - No data to display  Imaging Review Ct Head Wo Contrast  02/23/2015   CLINICAL DATA:  81 year old female with head injury  EXAM: CT HEAD WITHOUT CONTRAST  TECHNIQUE: Contiguous axial images were obtained from the base of the skull through the vertex without intravenous contrast.  COMPARISON:  CT dated 11/03/2013  FINDINGS: There is dilatation of the ventricles out of proportion with the sulci which may represent central volume loss versus normal pressure hydrocephalus. Clinical correlation is recommended. Periventricular and  deep white matter hypodensities represent chronic microvascular ischemic changes. There is no intracranial hemorrhage. No mass effect or midline shift identified.  The visualized paranasal sinuses and mastoid air cells are well aerated. The calvarium is intact.  IMPRESSION: No acute intracranial pathology.  Age-related atrophy and chronic microvascular ischemic disease.   Electronically Signed   By: Anner Crete M.D.   On: 02/23/2015 00:10     EKG Interpretation None      MDM   Final diagnoses:  Fall, initial encounter  Head injury, initial encounter   Vitals stable. She is well-appearing and in no acute distress. Patient is not on anticoagulation medication. She refused [name] medication. CT head is negative for any intracranial hemorrhage, mass or midline shift. Ambulatory with steady gait. I feel comfortable sending her back to the nursing facility.    Barbara Glazier, PA-C 02/23/15 [place], MD 02/23/15 [phone]

## 2015-02-22 NOTE — ED Notes (Signed)
Bed: FH54 Expected date:  Expected time:  Means of arrival:  Comments: Hold for patient from [facility]

## 2015-02-22 NOTE — ED Notes (Signed)
Brought in by EMS from [facility]) with c/o facial injury after her fall tonight.  Staff at the facility reported that pt was walking down the hall with her walker when she tripped and fell, hitting face to a "molding" on a wall---- sustained hematoma/bruising to nose without bleeding.  No loss of consciousness.  Also sustained skin injury to right lower leg, pressure dressing dry and intact on EMS' arrival.  No s/s deformity to all extremities noted.  Not on any blood thinners or anticoagulants.

## 2015-02-22 NOTE — ED Notes (Signed)
Bed: [facility] Expected date:  Expected time:  Means of arrival:  Comments: EMS 81 yo female/fall-lac to right leg

## 2015-02-22 NOTE — ED Notes (Signed)
Bed: [facility] Expected date:  Expected time:  Means of arrival:  Comments: Hold for [employer]

## 2015-02-22 NOTE — ED Notes (Signed)
Pt's contact: Joycelyn Schmid (daughter) ---- tel# [phone]                       Hedy Camara (son) ------- tel# [phone]

## 2015-02-23 NOTE — Discharge Instructions (Signed)

## 2015-02-23 NOTE — ED Notes (Signed)
[facility] here to transport pt back to [facility].

## 2015-02-23 NOTE — ED Notes (Signed)
[facility] was called for pt's transportation back to [facility].

## 2015-02-23 NOTE — ED Notes (Signed)
Staff at [facility] was called, report on pt was provided.

## 2015-02-23 NOTE — ED Notes (Signed)
Son was called that pt is being discharged.

## 2015-02-25 LAB — CBC AND DIFFERENTIAL
HCT: 36 % (ref 36–46)
HEMOGLOBIN: 11.7 g/dL — AB (ref 12.0–16.0)
Platelets: 304 10*3/uL (ref 150–399)
WBC: 7.7 10*3/mL

## 2015-02-25 LAB — BASIC METABOLIC PANEL
BUN: 23 mg/dL — AB (ref 4–21)
CREATININE: 1 mg/dL (ref 0.5–1.1)
Glucose: 117 mg/dL
Potassium: 4.1 mmol/L (ref 3.4–5.3)
Sodium: 141 mmol/L (ref 137–147)

## 2015-02-25 LAB — HEPATIC FUNCTION PANEL
ALT: 15 U/L (ref 7–35)
AST: 26 U/L (ref 13–35)
Alkaline Phosphatase: 120 U/L (ref 25–125)
BILIRUBIN, TOTAL: 0.9 mg/dL

## 2015-02-26 ENCOUNTER — Non-Acute Institutional Stay: Payer: Medicare PPO | Admitting: Internal Medicine

## 2015-02-26 DIAGNOSIS — R930 Abnormal findings on diagnostic imaging of skull and head, not elsewhere classified: Secondary | ICD-10-CM

## 2015-02-26 DIAGNOSIS — I480 Paroxysmal atrial fibrillation: Secondary | ICD-10-CM

## 2015-02-26 DIAGNOSIS — G308 Other Alzheimer's disease: Secondary | ICD-10-CM | POA: Diagnosis not present

## 2015-02-26 DIAGNOSIS — R000 Tachycardia, unspecified: Secondary | ICD-10-CM | POA: Diagnosis not present

## 2015-02-26 DIAGNOSIS — F0281 Dementia in other diseases classified elsewhere with behavioral disturbance: Secondary | ICD-10-CM

## 2015-02-26 DIAGNOSIS — R296 Repeated falls: Secondary | ICD-10-CM

## 2015-02-26 DIAGNOSIS — R9089 Other abnormal findings on diagnostic imaging of central nervous system: Secondary | ICD-10-CM

## 2015-02-26 DIAGNOSIS — G309 Alzheimer's disease, unspecified: Secondary | ICD-10-CM

## 2015-02-26 MED ORDER — MIRTAZAPINE 30 MG PO TABS: 30.0000 mg | ORAL_TABLET | Freq: Every day | ORAL | Status: AC

## 2015-02-26 NOTE — Progress Notes (Signed)
Patient ID: Barbara [name], female   DOB: [birth year], 81 y.o.   MRN: [id number]  Location:  [facility] Provider:  Sutton Hirsch L. Mariea Clonts, D.O., C.M.D.  Code Status:  DNR Goals of care: Advanced Directive information Does patient have an advance directive?: Yes, Type of Advance Directive: [place];Living will;Out of facility DNR (pink MOST or yellow form), Pre-existing out of facility DNR order (yellow form or pink MOST form): Pink MOST form placed in chart (order not valid for inpatient use);Yellow form placed in chart (order not valid for inpatient use), Does patient want to make changes to advanced directive?: No - Patient declined  Chief Complaint  Patient presents with  . Acute Visit    increased confusion, falls, wandering, packing up her things to elope b/c she has delusion that facility is closing; tachycardia/afib with RVR  . Medical Management of Chronic Issues    HPI:  81 yo white female with Alzheimer's disease in advanced stages, frequent falls, wandering and attempts to elope, macular degeneration and glaucoma, afib now only on aspirin after a large hip hematoma, hyperlipidemia, anemia, prior stroke seen for an acute visit as well as medical mgt of her chronic diseases.   She's been more confused, falling, wandering, and packing up her things to try to elope.  She thinks she's going to see her mother and also that someone has gotten her clothes backed up in a truck and the truck broke down on the side of the road.  She wants to go find them.  Her caregiver is trying to keep her calm by reading a book to her which she enjoys.  When I saw her, she kept reverting back to the story about the truck.    I was also asked to look at her b/c of her pulse being elevated, but recently, it's been 80s to 90s.  She is very upset and anxious about her delusion of moving.      She has some urinary incontinence, but does often make it to the restroom.  She initially  had a positive UA, but it grew out mixed organisms and repeat straight cath was negative.   Review of Systems: history from caregiver and nursing in memory care Review of Systems  Constitutional: Negative for fever, chills and malaise/fatigue.  Eyes: Positive for blurred vision.       Glasses  Respiratory: Negative for shortness of breath.   Cardiovascular: Negative for chest pain.  Gastrointestinal: Negative for abdominal pain and constipation.  Genitourinary: Positive for urgency.  Musculoskeletal: Positive for falls. Negative for myalgias, back pain and joint pain.  Neurological: Negative for dizziness and weakness.  Psychiatric/Behavioral: Positive for depression and memory loss. Negative for hallucinations. The patient is nervous/anxious and has insomnia.        Delusional    Past Medical History  Diagnosis Date  . Atrial fibrillation 2009    Long term anticoagulation. Warfarin changed to Xarelto 06/2012  . Cystocele, midline   . Senile dementia, uncomplicated   . Generalized anxiety disorder   . Unspecified glaucoma   . Macular degeneration (senile) of retina, unspecified   . Osteoarthrosis, unspecified whether generalized or localized, unspecified site   . Senile osteoporosis   . Pneumonia, organism unspecified 03/2012    Tx w/ PO antibx  . Unspecified vitamin D deficiency   . Unspecified venous (peripheral) insufficiency   . Tear film insufficiency, unspecified 08/2012  . Debility, unspecified 04/01/2012  . Long term (current) use  of anticoagulants 04/01/2012  . CVA (cerebral infarction) 11/19/2012  . Anemia 11/2012    transfusion 11/2012  . Unspecified hypothyroidism   . Other and unspecified hyperlipidemia     Atorvastatin started 12/2012  . Benign paroxysmal positional vertigo 11/08/2013  . Glaucoma   . Stroke 2014  . Contusion, chest wall 11/27/2013    Left side.   Marland Kitchen History of fall 11/27/2013  . Wound of right leg 11/13/2013    posteriorly     Patient Active Problem  List   Diagnosis Date Noted  . Hyperlipidemia 09/25/2014  . Dementia with behavioral disturbance 08/07/2014  . Allergic rhinitis 07/24/2014  . GERD (gastroesophageal reflux disease) 06/25/2014  . Esophageal dysphagia 06/18/2014  . Neck pain 01/24/2014  . Dizziness and giddiness 12/27/2013  . History of fall 11/27/2013  . Unspecified venous (peripheral) insufficiency 11/17/2013  . Benign paroxysmal positional vertigo 11/08/2013  . Glaucoma 05/29/2013  . Senile osteoporosis 03/08/2013  . Hypothyroidism   . Long term current use of anticoagulant therapy 12/19/2012  . CVA (cerebral infarction) 12/19/2012  . Back pain 11/10/2012  . Unspecified constipation 11/10/2012  . Atrial fibrillation   . Generalized anxiety disorder   . Osteoarthrosis, unspecified whether generalized or localized, unspecified site   . Anemia 11/08/2012    Allergies  Allergen Reactions  . Aricept [Donepezil Hcl] Other (See Comments)    Muscle pain    Medications: Patient's Medications  New Prescriptions   DILTIAZEM (CARDIZEM CD) 240 MG 24 HR CAPSULE    Take 1 capsule (240 mg total) by mouth daily.   MIRTAZAPINE (REMERON) 30 MG TABLET    Take 1 tablet (30 mg total) by mouth at bedtime.  Previous Medications   ACETAMINOPHEN (TYLENOL) 325 MG TABLET    Take 650 mg by mouth 3 (three) times daily.    ALBUTEROL (ACCUNEB) 1.25 MG/3ML NEBULIZER SOLUTION    Take 1 ampule by nebulization every 6 (six) hours as needed for wheezing.   ASPIRIN EC 81 MG TABLET    Take 81 mg by mouth daily.   ATORVASTATIN (LIPITOR) 10 MG TABLET    Take 10 mg by mouth daily at 6 PM. Take one tablet daily   CALCIUM CARBONATE (OS-CAL) 600 MG TABS TABLET    Take 600 mg by mouth daily with breakfast.   CHOLECALCIFEROL (VITAMIN D) 1000 UNITS TABLET    Take 2,000 Units by mouth daily.   DIVALPROEX (DEPAKOTE SPRINKLE) 125 MG CAPSULE    Take 250 mg by mouth 2 (two) times daily.   DORZOLAMIDE-TIMOLOL (COSOPT) 22.3-6.8 MG/ML OPHTHALMIC SOLUTION     Place 1 drop into both eyes 2 (two) times daily.   DULOXETINE (CYMBALTA) 30 MG CAPSULE    Take 30 mg by mouth 2 (two) times daily.   LEVOTHYROXINE (SYNTHROID, LEVOTHROID) 75 MCG TABLET    Take 75 mcg by mouth daily before breakfast.   MEMANTINE (NAMENDA XR) 28 MG CP24 24 HR CAPSULE    Take 28 mg by mouth daily.   METOPROLOL TARTRATE (LOPRESSOR) 25 MG TABLET    Take 25 mg by mouth 3 (three) times daily.    MONTELUKAST (SINGULAIR) 10 MG TABLET    Take 10 mg by mouth at bedtime.   MULTIPLE VITAMINS-MINERALS (PRESERVISION/LUTEIN) CAPS    Take 2 capsules by mouth daily.   OMEPRAZOLE (PRILOSEC) 20 MG CAPSULE    Take 20 mg by mouth daily.    ONDANSETRON (ZOFRAN-ODT) 4 MG DISINTEGRATING TABLET    Take 4 mg by mouth every 8 (  eight) hours as needed for nausea or vomiting.   POLYETHYLENE GLYCOL (MIRALAX / GLYCOLAX) PACKET    Take 17 g by mouth daily. Mix with 6oz beverage of choice. Hold for loose stool   TERIPARATIDE, RECOMBINANT, (FORTEO) 600 MCG/2.4ML SOLN    Inject 20 mcg into the skin every morning.   TRAVOPROST, BAK FREE, (TRAVATAN Z) 0.004 % SOLN OPHTHALMIC SOLUTION    Place 1 drop into both eyes daily.   Modified Medications   No medications on file  Discontinued Medications   ASPIRIN EC 81 MG TABLET    Take 1 tablet (81 mg total) by mouth daily.   BUPROPION (WELLBUTRIN XL) 150 MG 24 HR TABLET    Take 150 mg by mouth daily.   DILTIAZEM CD 240 MG 24 HR CAPSULE    Take 360 mg by mouth daily.   LEVOTHYROXINE (SYNTHROID, LEVOTHROID) 50 MCG TABLET    Take 50 mcg by mouth every morning. Before breakfast   LORAZEPAM (ATIVAN) 0.5 MG TABLET    Take 0.25 mg by mouth every 6 (six) hours as needed for anxiety.    Physical Exam: Filed Vitals:   02/26/15 1215  BP: 124/70  Pulse: 94  Temp: 97.9 F (36.6 C)  Resp: 18  Height: 4\' 11"  (1.499 m)  Weight: 102 lb (46.267 kg)  SpO2: 96%   Body mass index is 20.59 kg/(m^2).  Physical Exam  Constitutional: She appears well-developed and well-nourished. No  distress.  Cardiovascular: Regular rhythm, normal heart sounds and intact distal pulses.   Slight tachy  Pulmonary/Chest: Effort normal and breath sounds normal. No respiratory distress.  Abdominal: Soft. Bowel sounds are normal. She exhibits no distension. There is no tenderness.  Genitourinary:  Cystocele present  Musculoskeletal: Normal range of motion.  Walks with caregiver  Neurological: She is alert.  oriented to person only--thinks she's in a closing facility  Psychiatric:  Delusional (see hpi), anxious    Labs reviewed: Basic Metabolic Panel:  Recent Labs  06/21/14 09/26/14 02/25/15  NA 136* 142 141  K 3.8  12.6* 3.7 4.1  BUN 24* 16 23*  CREATININE 0.8 0.7 1.0    Liver Function Tests:  Recent Labs  06/21/14 07/26/14 02/25/15  AST 21 18 26   ALT 9 8 15   ALKPHOS 70 76 120    CBC:  Recent Labs  06/26/14 10/06/14 02/25/15  WBC 6.4 7.9 7.7  HGB 12.3 13.0 11.7*  HCT 37 37 36  PLT 242 264 304    Lab Results  Component Value Date   TSH 1.47 02/28/2015   Lab Results  Component Value Date   HGBA1C 5.5 12/20/2012   Lab Results  Component Value Date   CHOL 120 07/26/2014   HDL 40 07/26/2014   LDLCALC 63 07/26/2014   TRIG 85 07/26/2014   CHOLHDL 3.5 12/20/2012    Significant Diagnostic Results since last visit:  ED imaging reviewed: 01/12/15:   -Left ankle xrays:  Minimally displaced lateral malleolar fracture with overlying soft tissue swelling -Left foot complete xrays:  1. Acute minimally displaced fracture of the lateral malleolus. 2. Osteopenia. 3. Advanced degenerative changes at the first MTP joint -Lumbar xrays:   1. Degenerative changes of the lumbar spine with grade 1 anterolisthesis at L4-5. 2. Chronic loss of disc height and facet hypertrophy at L5-S1. 3. No acute abnormality. CT pelvis w/o contrast: No acute osseous abnormality. Soft tissue contusion lateral to the right greater trochanter with a 2 cm subcutaneous  hematoma.  02/23/15:   CT  head w/o contrast: No acute intracranial pathology.  dilatation of the ventricles out of proportion with the sulci which may represent central volume loss versus normal pressure hydrocephalus.  Age-related atrophy and chronic microvascular ischemic disease.  Patient Care Team: Gayland Curry, DO as PCP - General (Geriatric Medicine) [place], NP as Nurse Practitioner (Nurse Practitioner)  Assessment/Plan 1. Alzheimer's dementia with behavioral disturbance -progressing with more difficulty with delusions, agitation -redirection has been effective at times, but not always -will start her on depakote sprinkles low dose 125mg  po bid--can be opened and put on food if it helps administration -does not seem to respond to ativan -if severely agitation and danger to herself or others, haldol 0.5mg  may be given once and if still having same agitation, repeated in 30 mins--try to avoid using this ongoing due to potential CV risks/blackbox warning  2. Paroxysmal atrial fibrillation -cont diltiazem, asa 81mg  (falls too much for anything else) and monitor -does not seem she's in RVR, but sinus tach related to her agitation and delusions  3. Falls frequently -due to progression of her dementia and continued attempts to wander and walk -cont 24 hr caregiver and close supervision  4. Abnormal CT of brain -doubt she truly has NPH -even if she does, her dementia is too progressed for intervention to be effective (normally treated when seen on initial CT and she's had dementia for many years) plus it's quite invasive (requiring lumbar puncture/spinal tap to extract spinal fluid to lower brain pressure)--suspect the appearance is due to extensive atrophy -typically NPH has wide-based gait, prominent severe urinary incontinence and dementia  5.  Tachycardia -cont diltiazem and monitor -if she's in rapid afib on ekg, would try to increase  dose or give single dose lopressor, but her bp runs on the low side so I suspect she'll become hypotensive and her fall risk will increase even more  Family/ staff Communication: discussed with caregiver, memory care nursing, nurse manager  Azalyn Sliwa L. Zissel Biederman, D.O. [facility] [address], [place] [address] Cell Phone (Mon-Fri 8am-5pm):  [phone] On Call:  [phone] & follow prompts after 5pm & weekends Office Phone:  [phone] Office Fax:  [phone]

## 2015-02-28 LAB — TSH: TSH: 1.47 u[IU]/mL (ref <=5.90)

## 2015-03-04 ENCOUNTER — Non-Acute Institutional Stay: Payer: Medicare PPO | Admitting: Adult Health

## 2015-03-04 ENCOUNTER — Telehealth: Payer: Self-pay | Admitting: Cardiology

## 2015-03-04 DIAGNOSIS — F0391 Unspecified dementia with behavioral disturbance: Secondary | ICD-10-CM

## 2015-03-04 DIAGNOSIS — I482 Chronic atrial fibrillation, unspecified: Secondary | ICD-10-CM

## 2015-03-04 DIAGNOSIS — R296 Repeated falls: Secondary | ICD-10-CM | POA: Diagnosis not present

## 2015-03-04 DIAGNOSIS — F03918 Unspecified dementia, unspecified severity, with other behavioral disturbance: Secondary | ICD-10-CM

## 2015-03-04 MED ORDER — DILTIAZEM HCL ER COATED BEADS 360 MG PO CP24: 360.0000 mg | ORAL_CAPSULE | Freq: Every day | ORAL | Status: DC

## 2015-03-04 NOTE — Telephone Encounter (Signed)
New Message       Office calling stating that pt is having a HR of 140 at times. Wanting to get pt sooner appt and there is nothing available. Please call back and advise.

## 2015-03-04 NOTE — Progress Notes (Signed)
Cardiology Office Note   Date:  03/05/2015   ID:  Barbara [name], DOB Jun 12, 1929, MRN [id number]  PCP:  Hollace Kinnier, DO  Cardiologist:  Dr. Candee Furbish     Chief Complaint  Patient presents with  . Atrial Fibrillation     History of Present Illness: Barbara [name] is a 81 y.o. female with a hx of chronic AFib, prior CVA (off anticoagulation), hyperlipidemia, dementia, anemia.  She has a history of frequent falls. Last seen by Dr. Marlou Porch 10/17/14. Given high risk of falls and high risk of bleeding, it was decided to discontinue her anticoagulation (Eliquis).  She stays at [facility] in the memory unit.  Patient called in recently with complaints of elevated heart rates into the 140s. Case was discussed with Dr. Rayann Heman, DOD. Diltiazem was increased to 360 mg daily. She presents for follow-up.  She is here today with her caretaker and daughter. Several days ago, she became more agitated. A urinalysis was abnormal. Follow-up cultures were negative. CBC, BMET, LFTs were unremarkable. She did have some increasing LE edema. Heart rates were noted to be in the 150s at one point. LE edema is now improved. Patient denies chest pain, shortness of breath, syncope, orthopnea, PND. She denies coughing or wheezing. She denies melena or hematochezia. She denies vomiting, diarrhea, fevers.   Studies/Reports Reviewed Today:  Echo 12/20/12 - Mildconcentric hypertrophy. EF 60% to65%. Wall motion was normal;  - Mitral valve: Moderately to severely calcified annulus. Mildly thickened leaflets . Mild to moderateregurgitation. Impressions:  - No cardiac source of emboli was indentified.  Carotid US 12/20/12 No significant extracranial carotid artery stenosis demonstrated.    Past Medical History  Diagnosis Date  . Atrial fibrillation 2009    Long term anticoagulation. Warfarin changed to Xarelto 06/2012  . Cystocele, midline   . Senile dementia, uncomplicated   . Generalized anxiety  disorder   . Unspecified glaucoma   . Macular degeneration (senile) of retina, unspecified   . Osteoarthrosis, unspecified whether generalized or localized, unspecified site   . Senile osteoporosis   . Pneumonia, organism unspecified 03/2012    Tx w/ PO antibx  . Unspecified vitamin D deficiency   . Unspecified venous (peripheral) insufficiency   . Tear film insufficiency, unspecified 08/2012  . Debility, unspecified 04/01/2012  . Long term (current) use of anticoagulants 04/01/2012  . CVA (cerebral infarction) 11/19/2012  . Anemia 11/2012    transfusion 11/2012  . Unspecified hypothyroidism   . Other and unspecified hyperlipidemia     Atorvastatin started 12/2012  . Benign paroxysmal positional vertigo 11/08/2013  . Glaucoma   . Stroke 2014  . Contusion, chest wall 11/27/2013    Left side.   Marland Kitchen History of fall 11/27/2013  . Wound of right leg 11/13/2013    posteriorly     Past Surgical History  Procedure Laterality Date  . Tonsillectomy and adenoidectomy    . Umbilical hernia repair    . Cataract extraction w/ intraocular lens  implant, bilateral    . Spine surgery      RE; Spinal stenosis  . Esophagogastroduodenoscopy (egd) with propofol N/A 11/21/2013    Procedure: ESOPHAGOGASTRODUODENOSCOPY (EGD) WITH PROPOFOL;  Surgeon: Cleotis Nipper, MD;  Location: [facility] ENDOSCOPY;  Service: Endoscopy;  Laterality: N/A;  . Colonoscopy with propofol N/A 11/21/2013    Procedure: COLONOSCOPY WITH PROPOFOL;  Surgeon: Cleotis Nipper, MD;  Location: [facility] ENDOSCOPY;  Service: Endoscopy;  Laterality: N/A;     Current Outpatient Prescriptions  Medication  Sig Dispense Refill  . acetaminophen (TYLENOL) 325 MG tablet Take 650 mg by mouth 3 (three) times daily.     Marland Kitchen albuterol (ACCUNEB) 1.25 MG/3ML nebulizer solution Take 1 ampule by nebulization every 6 (six) hours as needed for wheezing.    Marland Kitchen aspirin EC 81 MG tablet Take 81 mg by mouth daily.    Marland Kitchen atorvastatin (LIPITOR) 10 MG tablet Take 10 mg by mouth daily  at 6 PM. Take one tablet daily    . calcium carbonate (OS-CAL) 600 MG TABS tablet Take 600 mg by mouth daily with breakfast.    . cholecalciferol (VITAMIN D) 1000 UNITS tablet Take 2,000 Units by mouth daily.    . dorzolamide-timolol (COSOPT) 22.3-6.8 MG/ML ophthalmic solution Place 1 drop into both eyes 2 (two) times daily.    . DULoxetine (CYMBALTA) 30 MG capsule Take 30 mg by mouth 2 (two) times daily.    Marland Kitchen levothyroxine (SYNTHROID, LEVOTHROID) 75 MCG tablet Take 75 mcg by mouth daily before breakfast.    . memantine (NAMENDA XR) 28 MG CP24 24 hr capsule Take 28 mg by mouth daily.    . metoprolol tartrate (LOPRESSOR) 25 MG tablet Take 25 mg by mouth 3 (three) times daily.     . mirtazapine (REMERON) 30 MG tablet Take 1 tablet (30 mg total) by mouth at bedtime. 30 tablet 3  . montelukast (SINGULAIR) 10 MG tablet Take 10 mg by mouth at bedtime.    . Multiple Vitamins-Minerals (PRESERVISION/LUTEIN) CAPS Take 2 capsules by mouth daily.    Marland Kitchen omeprazole (PRILOSEC) 20 MG capsule Take 20 mg by mouth daily.     . ondansetron (ZOFRAN-ODT) 4 MG disintegrating tablet Take 4 mg by mouth every 8 (eight) hours as needed for nausea or vomiting.    . polyethylene glycol (MIRALAX / GLYCOLAX) packet Take 17 g by mouth daily. Mix with 6oz beverage of choice. Hold for loose stool    . Teriparatide, Recombinant, (FORTEO) 600 MCG/2.4ML SOLN Inject 20 mcg into the skin every morning.    . Travoprost, BAK Free, (TRAVATAN Z) 0.004 % SOLN ophthalmic solution Place 1 drop into both eyes daily.     Marland Kitchen diltiazem (CARDIZEM CD) 240 MG 24 hr capsule Take 1 capsule (240 mg total) by mouth daily. 30 capsule 6   No current facility-administered medications for this visit.    Allergies:   Aricept    Social History:  The patient  reports that she quit smoking about 34 years ago. Her smoking use included Cigarettes. She has never used smokeless tobacco. She reports that she does not drink alcohol or use illicit drugs.   Family  History:  The patient's family history includes Heart attack in her father.    ROS:   Please see the history of present illness.   Review of Systems  Constitution: Positive for diaphoresis.  Cardiovascular: Positive for leg swelling.  Psychiatric/Behavioral: The patient is nervous/anxious.       PHYSICAL EXAM: VS:  BP 94/56 mmHg  Pulse 71  Ht 4\' 11"  (1.499 m)  Wt 106 lb 3.2 oz (48.172 kg)  BMI 21.44 kg/m2    Wt Readings from Last 3 Encounters:  03/05/15 106 lb 3.2 oz (48.172 kg)  10/29/14 103 lb (46.72 kg)  10/17/14 103 lb (46.72 kg)     GEN: Well nourished, well developed, in no acute distress HEENT: normal Neck: no JVD,  no masses Cardiac:  Normal S1/S2, irregularly irregular rhythm; no murmur ,  no rubs or gallops, trace-1+  bilateral ankle/LE edema with some bullous formation on the RLE Respiratory:  clear to auscultation bilaterally, no wheezing, rhonchi or rales. GI: soft, nontender, somewhat distended, + BS MS: no deformity or atrophy Skin: warm and dry  Neuro:  CNs II-XII intact, Strength and sensation are intact Psych: Normal affect   EKG:  EKG is ordered today.  It demonstrates:   AFib, HR 71, no change from prior tracing ECG from 03/01/15: Atrial fibrillation, HR 118  Recent Labs: 02/25/2015: ALT 15; BUN 23*; Creatinine 1.0; Hemoglobin 11.7*; Platelets 304; Potassium 4.1; Sodium 141 02/28/2015: TSH 1.47    Lipid Panel    Component Value Date/Time   CHOL 120 07/26/2014   TRIG 85 07/26/2014   HDL 40 07/26/2014   CHOLHDL 3.5 12/20/2012 0555   VLDL 22 12/20/2012 0555   LDLCALC 63 07/26/2014      ASSESSMENT AND PLAN:  Chronic atrial fibrillation:  Patient recently had significant increase her heart rate. I suspect that she developed HFpEF in the setting of uncontrolled heart rate. LE edema is now improved in the setting of improved heart rate. She did have some agitation that preceded all this. Question if she had some type of viral illness that may have  precipitated her increased heart rate. She now has a low blood pressure. It is not clear that she is symptomatic with this. She's had a history of significant falls. I do not want to increase her fall risk with having a low BP. I will adjust her medications further to hopefully keep her blood pressure from dropping so low. I will change her Cardizem back to Cardizem CD 240 mg daily. I will increase her metoprolol tartrate to 25 mg 3 times a day. Hopefully Metoprolol will not have as much of an effect on her BP as the Diltiazem.  She still has some evidence of volume excess on exam. I will give her 1 dose of Lasix 20 mg by mouth 1. I will obtain an EKG and have it faxed here in 2 days. She has follow-up next month and she should keep this appointment.  Hyperlipidemia:  Continue statin.  Dementia with behavioral disturbance: She is managed in the memory unit.   Cerebral infarction due to other mechanism:  She is high risk for recurrent CVA off anticoagulation. However, bleeding risk is too great. She remains on aspirin only.    Medication Changes: Current medicines are reviewed at length with the patient today.  Concerns regarding medicines are as outlined above.  The following changes have been made:   Discontinued Medications   DILTIAZEM (CARDIZEM CD) 360 MG 24 HR CAPSULE    Take 1 capsule (360 mg total) by mouth daily.   Modified Medications   No medications on file  Increase Metoprolol Tartrate to 25 mg Three times a day   New Prescriptions   DILTIAZEM (CARDIZEM CD) 240 MG 24 HR CAPSULE    Take 1 capsule (240 mg total) by mouth daily.    Labs/ tests ordered today include:   Orders Placed This Encounter  Procedures  . EKG 12-Lead     Disposition:   FU with Ermalinda Barrios, PA-C next month as planned.    Signed, Versie Starks, MHS 03/05/2015 12:48 PM    [facility], [place],   [address] Phone: [phone]; Fax: [phone]

## 2015-03-04 NOTE — Telephone Encounter (Signed)
Andee Poles states that the pts heart has been as high as 140 bpm while in their office today. She states that he pt denies SOB or dizziness and is asymptomatic. Andee Poles states that she called this office last week with the same complaints about the pt and was given an appointment to be seen on 8/22.   Andee Poles is requesting a sooner appointment for the pt.

## 2015-03-04 NOTE — Telephone Encounter (Signed)
Per Dr Rayann Heman, DOD, Barbara [name] is advised to increase the pts Diltiazem to 360 mg daily. A sooner appt has been scheduled for the pt to see Richardson Dopp, PA-c tomorrow (7/26) at 10 am. Barbara [name] repeated instructions back to me and verbalized understanding. Barbara [name] states that she will handle to pts Diltiazem RX. She thanked me for my help.

## 2015-03-05 ENCOUNTER — Ambulatory Visit (INDEPENDENT_AMBULATORY_CARE_PROVIDER_SITE_OTHER): Payer: Medicare PPO | Admitting: Physician Assistant

## 2015-03-05 ENCOUNTER — Encounter: Payer: Self-pay | Admitting: Physician Assistant

## 2015-03-05 VITALS — BP 94/56 | HR 71 | Ht 59.0 in | Wt 106.2 lb

## 2015-03-05 DIAGNOSIS — I638 Other cerebral infarction: Secondary | ICD-10-CM

## 2015-03-05 DIAGNOSIS — I6389 Other cerebral infarction: Secondary | ICD-10-CM

## 2015-03-05 DIAGNOSIS — F03918 Unspecified dementia, unspecified severity, with other behavioral disturbance: Secondary | ICD-10-CM

## 2015-03-05 DIAGNOSIS — E785 Hyperlipidemia, unspecified: Secondary | ICD-10-CM | POA: Diagnosis not present

## 2015-03-05 DIAGNOSIS — F0391 Unspecified dementia with behavioral disturbance: Secondary | ICD-10-CM | POA: Diagnosis not present

## 2015-03-05 DIAGNOSIS — I482 Chronic atrial fibrillation, unspecified: Secondary | ICD-10-CM

## 2015-03-05 MED ORDER — DILTIAZEM HCL ER COATED BEADS 240 MG PO CP24: 240.0000 mg | ORAL_CAPSULE | Freq: Every day | ORAL | Status: DC

## 2015-03-05 NOTE — Patient Instructions (Signed)
Medication Instructions:  Discontinue Cardizem CD ( 360 mg ) daily Start Cardizem CD ( 240 mg ) daily Increase metoprolol tartrate to ( 25 mg ) three times a day Lasix ( 20 mg ) po x 1 dose only ( today or tomorrow) please   Labwork: -None  Testing/Procedures: -None  Follow-Up: Your physician recommends that you keep scheduled  follow-up appointment with Ermalinda Barrios, PA on August 22 @ 10:15 am   Any Other Special Instructions Will Be Listed Below (If Applicable). Check EKG Thursday and fax results over @ [phone] to Attention: Richardson Dopp, PA

## 2015-03-06 ENCOUNTER — Encounter: Payer: Self-pay | Admitting: Adult Health

## 2015-03-06 NOTE — Progress Notes (Signed)
Patient ID: Karista Aispuro, female   DOB: 05-22-1929, 81 y.o.   MRN: [id number]     Nursing Home Location:  [place]    Patient Care Team: Gayland Curry, DO as PCP - General (Geriatric Medicine) Well Pateros, NP as Nurse Practitioner (Nurse Practitioner)  N Place of Service: ALF [phone])  Chief Complaint  Patient presents with  . Acute Visit    f/u agitation, afib, edema    HPI:  81 y.o. female residing at [facility], memory care unit. I am here to f/u regarding afib, edema, and agitation.  She has a hx of dementia with behavior disturbance (vascular vs. AD). She has become increasingly agitated and trying to elope. She has caregivers with her during the day and tends to fall in the evening.  She is having delusions that her car has been stolen and that she is being discharged from the memory care unit.  She was started on depakote last week with minimal improvement in symptoms. A UA, CBC, electrolyte panel were performed prior to starting depakote which were unrevealing.   She has a hx of afib which was previously rate controlled on cardizem 240 mg. She is not on anticoagulation due to falls. Her rate was elevated on 7/22 up to 140 and she was started on lopressor 25 mg BID with improvement. An EKG noted afib without acute ischemia. She did have a 3 lb weight gain with some pedal edema but no SOB, DOE, or CP.  She continues to have frequent falls due to her dementia, gait disorder, and decreased vision. She uses a walker and has caregivers. She most recently hit her head and has bruising to the bridge of her nose. She has not had any neurologic signs.     Review of Systems:  Review of Systems  Constitutional: Negative for fever, chills, activity change and appetite change.  Respiratory: Negative for cough and shortness of breath.   Cardiovascular: Positive for leg swelling. Negative for chest pain and  palpitations.  Gastrointestinal: Negative for abdominal pain and abdominal distention.  Musculoskeletal: Positive for gait problem.  Neurological: Negative for dizziness, facial asymmetry, speech difficulty and weakness.  Psychiatric/Behavioral: Positive for behavioral problems, confusion and agitation.    Medications: Patient's Medications  New Prescriptions   DILTIAZEM (CARDIZEM CD) 240 MG 24 HR CAPSULE    Take 1 capsule (240 mg total) by mouth daily.  Previous Medications   ACETAMINOPHEN (TYLENOL) 325 MG TABLET    Take 650 mg by mouth 3 (three) times daily.    ALBUTEROL (ACCUNEB) 1.25 MG/3ML NEBULIZER SOLUTION    Take 1 ampule by nebulization every 6 (six) hours as needed for wheezing.   ASPIRIN EC 81 MG TABLET    Take 81 mg by mouth daily.   ATORVASTATIN (LIPITOR) 10 MG TABLET    Take 10 mg by mouth daily at 6 PM. Take one tablet daily   CALCIUM CARBONATE (OS-CAL) 600 MG TABS TABLET    Take 600 mg by mouth daily with breakfast.   CHOLECALCIFEROL (VITAMIN D) 1000 UNITS TABLET    Take 2,000 Units by mouth daily.   DIVALPROEX (DEPAKOTE SPRINKLE) 125 MG CAPSULE    Take 250 mg by mouth 2 (two) times daily.   DORZOLAMIDE-TIMOLOL (COSOPT) 22.3-6.8 MG/ML OPHTHALMIC SOLUTION    Place 1 drop into both eyes 2 (two) times daily.   DULOXETINE (CYMBALTA) 30 MG CAPSULE    Take 30 mg by mouth 2 (two) times  daily.   LEVOTHYROXINE (SYNTHROID, LEVOTHROID) 75 MCG TABLET    Take 75 mcg by mouth daily before breakfast.   MEMANTINE (NAMENDA XR) 28 MG CP24 24 HR CAPSULE    Take 28 mg by mouth daily.   METOPROLOL TARTRATE (LOPRESSOR) 25 MG TABLET    Take 25 mg by mouth 3 (three) times daily.    MIRTAZAPINE (REMERON) 30 MG TABLET    Take 1 tablet (30 mg total) by mouth at bedtime.   MONTELUKAST (SINGULAIR) 10 MG TABLET    Take 10 mg by mouth at bedtime.   MULTIPLE VITAMINS-MINERALS (PRESERVISION/LUTEIN) CAPS    Take 2 capsules by mouth daily.   OMEPRAZOLE (PRILOSEC) 20 MG CAPSULE    Take 20 mg by mouth daily.      ONDANSETRON (ZOFRAN-ODT) 4 MG DISINTEGRATING TABLET    Take 4 mg by mouth every 8 (eight) hours as needed for nausea or vomiting.   POLYETHYLENE GLYCOL (MIRALAX / GLYCOLAX) PACKET    Take 17 g by mouth daily. Mix with 6oz beverage of choice. Hold for loose stool   TERIPARATIDE, RECOMBINANT, (FORTEO) 600 MCG/2.4ML SOLN    Inject 20 mcg into the skin every morning.   TRAVOPROST, BAK FREE, (TRAVATAN Z) 0.004 % SOLN OPHTHALMIC SOLUTION    Place 1 drop into both eyes daily.   Modified Medications   No medications on file  Discontinued Medications   DILTIAZEM (CARDIZEM CD) 360 MG 24 HR CAPSULE    Take 1 capsule (360 mg total) by mouth daily.     Physical Exam:  Filed Vitals:   03/06/15 1159  BP: 146/84  Pulse: 100    Physical Exam  Constitutional: No distress.  frail  Cardiovascular: Exam reveals no gallop and no friction rub.   No murmur heard. irreg irreg, +1 BLE edema  Pulmonary/Chest: Breath sounds normal. No respiratory distress. She has no wheezes.  Abdominal: Soft. Bowel sounds are normal. She exhibits no distension. There is no tenderness.  Neurological: She is alert.  No focal deficit, MAE, able to f/c. Oriented to self only  Skin: She is not diaphoretic.  Psychiatric:  Agitated for exam but able to f/c    Labs reviewed/Significant Diagnostic Results:  Basic Metabolic Panel:  Recent Labs  06/21/14 09/26/14 02/25/15  NA 136* 142 141  K 3.8  12.6* 3.7 4.1  BUN 24* 16 23*  CREATININE 0.8 0.7 1.0   Liver Function Tests:  Recent Labs  06/21/14 07/26/14 02/25/15  AST 21 18 26   ALT 9 8 15   ALKPHOS 70 76 120   No results for input(s): LIPASE, AMYLASE in the last 8760 hours. No results for input(s): AMMONIA in the last 8760 hours. CBC:  Recent Labs  06/26/14 10/06/14 02/25/15  WBC 6.4 7.9 7.7  HGB 12.3 13.0 11.7*  HCT 37 37 36  PLT 242 264 304   CBG: No results for input(s): GLUCAP in the last 8760 hours. TSH:  Recent Labs  08/21/14 10/06/14 02/28/15   TSH 0.04* 0.24* 1.47   A1C: Lab Results  Component Value Date   HGBA1C 5.5 12/20/2012   Lipid Panel:  Recent Labs  07/26/14  CHOL 120  HDL 40  LDLCALC 63  TRIG 85       Assessment/Plan  1. Chronic atrial fibrillation -improved with the addition of lopressor 25 mg BID -not on anticoagulation due to falls -f/u with cards tomorrow -appears to have had mild HF due to uncontrolled weight with mild weight gain and edema in her  feet -hold off on lasix til seen by cards as she is not dyspneic and has clear lungs  2. Dementia with behavioral disturbance -uncontrolled with agitation in the evening and falls, as well as attempts to elope -has delusions that her car has been stolen and she is very upset about this -increase Depakote 250 mg BID and consider addition of seroquel if no improvement as she did have significant improvement with haldol  3. Frequent falls -on going concern with gait disorder, decreased vision,  and intermittent soft BP -continue fall prec, caregivers, and walker  I discussed her plan of care with her daughter. [name] is concerned that her mom is agitated and that she responded to Haldol. She is wondering if we can schedule a dose. I let her know that is a possibility but we would prefer not to given the s/e.  She is in agreement to continue to titrate up the depakote and consider additional agents if no improvement. I made her aware that all of the drugs that control mood have s/e and can contribute to falls. She is aware of this and we will proceed with caution to manage her behavior  Cindi Carbon, [place] [phone]

## 2015-03-08 ENCOUNTER — Telehealth: Payer: Self-pay | Admitting: Physician Assistant

## 2015-03-08 NOTE — Telephone Encounter (Signed)
I s/w Cathy, RN Nursing Supervisor in regards to orders per [employer]. PA for change in Metoprolol dosing. D/C metoprolol 25 mg TID; Increase to 50 mg BID; if SBP < 100 then continue 25 mg TID with RN to call MD for further advice.

## 2015-03-08 NOTE — Telephone Encounter (Signed)
New message       Calling to get clarification on ekg and metoprolol orders.

## 2015-03-09 ENCOUNTER — Encounter: Payer: Self-pay | Admitting: Internal Medicine

## 2015-03-12 NOTE — Addendum Note (Signed)
Addended by: Gayland Curry on: 03/12/2015 09:56 AM   Modules accepted: Level of Service

## 2015-03-15 ENCOUNTER — Telehealth: Payer: Self-pay | Admitting: Cardiology

## 2015-03-15 NOTE — Telephone Encounter (Signed)
New message      Pt c/o medication issue:  1. Name of Medication: diltiazem  2. How are you currently taking this medication (dosage and times per day)? 360mg   3. Are you having a reaction (difficulty breathing--STAT)? no  4. What is your medication issue? Ins will no longer cover medications.  Will Dr Marlou Porch want to change her to something else or go thru prior appoval?

## 2015-03-15 NOTE — Telephone Encounter (Signed)
Spoke with daughter about letter she received from [facility] about Diltiazem CD 360 mg not being covered on her formulary and they would allow a short term supply but pt should ask the MD about changing her med to something that is on the formulary.  Cost would go from $11 to over $200.  Reviewed the formulary online for [facility] Choice PPO [id number] which does show Diltiazem ER and XR being covered as a Tier 2.  In speaking with the daughter and reviewing the chart pt's med was changed 7/25 to 360 but on 7/26 was decreased back to 240 mg a day.  Spoke with Ronalee Belts at [facility] [phone]) who reports they did fill the 240 mg a day and they have a paid claim so he doesn't think there will be a problem with coverage.  Advised daughter of conversation with Ronalee Belts.  She is going to call the insurance company to further clarify.  She will call back if any questions or concerns.

## 2015-03-18 ENCOUNTER — Non-Acute Institutional Stay: Payer: Medicare PPO | Admitting: Adult Health

## 2015-03-18 ENCOUNTER — Encounter: Payer: Self-pay | Admitting: Adult Health

## 2015-03-18 DIAGNOSIS — I5031 Acute diastolic (congestive) heart failure: Secondary | ICD-10-CM

## 2015-03-18 DIAGNOSIS — S81819A Laceration without foreign body, unspecified lower leg, initial encounter: Secondary | ICD-10-CM

## 2015-03-18 DIAGNOSIS — I480 Paroxysmal atrial fibrillation: Secondary | ICD-10-CM | POA: Diagnosis not present

## 2015-03-18 DIAGNOSIS — I509 Heart failure, unspecified: Secondary | ICD-10-CM | POA: Insufficient documentation

## 2015-03-18 NOTE — Progress Notes (Addendum)
Patient ID: Barbara [name], female   DOB: 1929/03/13, 81 y.o.   MRN: [id number]     Nursing Home Location:  [place]    Patient Care Team: Gayland Curry, DO as PCP - General (Geriatric Medicine) Well Fourche, NP as Nurse Practitioner (Nurse Practitioner)  Place of Service: ALF [phone])  Chief Complaint  Patient presents with  . Acute Visit    increased edema, decreased 02 sat    HPI:  81 y.o. female residing at [facility], memory care unit. I was asked to see her today for increased edema to her legs and to eval a wound to her right lower leg.  She has a hx of afib with RVR and was seen by cardiology on 03/05/15.  She is currently on Cardiazem 240 daily and  lopressor 50 mg BID.  Her rate is controlled at 74-83 and her BP has ranged 95-144/88-90.  Her weight has remained unchanged at 108 lbs. She was given one dose of lasix 20 mg for edema and CHF due to her afib on 7/26.  The staff is reporting that her legs have worsening edema. Her sats dropped into the mid 80's on 8/6 and 02 was applied. She was able to return to [facility] the following day.   She fell two weeks ago and sustained a skin tear to the right lower leg. The area has yellow tissue and is not healing. She was removing other dressings and will only allow a transparent dressing to be applied. There is some clear and yellow drainage but no warmth or pain. She is afebrile.   Review of Systems:  Review of Systems  Constitutional: Negative for fever, chills, activity change and appetite change.  Respiratory: Negative for cough and shortness of breath.   Cardiovascular: Positive for leg swelling. Negative for chest pain and palpitations.  Gastrointestinal: Negative for abdominal pain and abdominal distention.  Musculoskeletal: Positive for gait problem.  Skin: Positive for wound. Negative for pallor and rash.  Neurological: Negative for dizziness, facial  asymmetry, speech difficulty and weakness.  Psychiatric/Behavioral: Positive for behavioral problems, confusion and agitation.    Medications: Patient's Medications  New Prescriptions   No medications on file  Previous Medications   ACETAMINOPHEN (TYLENOL) 325 MG TABLET    Take 650 mg by mouth 3 (three) times daily.    ALBUTEROL (ACCUNEB) 1.25 MG/3ML NEBULIZER SOLUTION    Take 1 ampule by nebulization every 6 (six) hours as needed for wheezing.   ASPIRIN EC 81 MG TABLET    Take 81 mg by mouth daily.   ATORVASTATIN (LIPITOR) 10 MG TABLET    Take 10 mg by mouth daily at 6 PM. Take one tablet daily   CALCIUM CARBONATE (OS-CAL) 600 MG TABS TABLET    Take 600 mg by mouth daily with breakfast.   CHOLECALCIFEROL (VITAMIN D) 1000 UNITS TABLET    Take 2,000 Units by mouth daily.   DILTIAZEM (CARDIZEM CD) 240 MG 24 HR CAPSULE    Take 1 capsule (240 mg total) by mouth daily.   DIVALPROEX (DEPAKOTE SPRINKLE) 125 MG CAPSULE    Take 250 mg by mouth 2 (two) times daily.   DORZOLAMIDE-TIMOLOL (COSOPT) 22.3-6.8 MG/ML OPHTHALMIC SOLUTION    Place 1 drop into both eyes 2 (two) times daily.   DULOXETINE (CYMBALTA) 30 MG CAPSULE    Take 30 mg by mouth 2 (two) times daily.   LEVOTHYROXINE (SYNTHROID, LEVOTHROID) 75 MCG TABLET    Take  75 mcg by mouth daily before breakfast.   MEMANTINE (NAMENDA XR) 28 MG CP24 24 HR CAPSULE    Take 28 mg by mouth daily.   METOPROLOL TARTRATE (LOPRESSOR) 25 MG TABLET    Take 25 mg by mouth 3 (three) times daily.    MIRTAZAPINE (REMERON) 30 MG TABLET    Take 1 tablet (30 mg total) by mouth at bedtime.   MONTELUKAST (SINGULAIR) 10 MG TABLET    Take 10 mg by mouth at bedtime.   MULTIPLE VITAMINS-MINERALS (PRESERVISION/LUTEIN) CAPS    Take 2 capsules by mouth daily.   OMEPRAZOLE (PRILOSEC) 20 MG CAPSULE    Take 20 mg by mouth daily.    ONDANSETRON (ZOFRAN-ODT) 4 MG DISINTEGRATING TABLET    Take 4 mg by mouth every 8 (eight) hours as needed for nausea or vomiting.   POLYETHYLENE GLYCOL  (MIRALAX / GLYCOLAX) PACKET    Take 17 g by mouth daily. Mix with 6oz beverage of choice. Hold for loose stool   TERIPARATIDE, RECOMBINANT, (FORTEO) 600 MCG/2.4ML SOLN    Inject 20 mcg into the skin every morning.   TRAVOPROST, BAK FREE, (TRAVATAN Z) 0.004 % SOLN OPHTHALMIC SOLUTION    Place 1 drop into both eyes daily.   Modified Medications   No medications on file  Discontinued Medications   No medications on file     Physical Exam:  Filed Vitals:   03/18/15 1402  BP: 124/90  Pulse: 83  Temp: 98.6 F (37 C)  Resp: 16  Weight: 108 lb (48.988 kg)  SpO2: 95%    Physical Exam  Constitutional: No distress.  frail  Neck: Normal range of motion. Neck supple. No JVD present.  Cardiovascular: Exam reveals no gallop and no friction rub.   No murmur heard. irreg irreg, +1 BLE edema  Pulmonary/Chest: Effort normal. No respiratory distress. She has no wheezes.  Bibasilar crackles  Abdominal: Soft. Bowel sounds are normal. She exhibits no distension. There is no tenderness.  Neurological: She is alert.  No focal deficit, MAE, able to f/c. Oriented to self only  Skin: Skin is warm and dry. She is not diaphoretic.  Right inner calf skin tear with clear and yellow drainage, wound bed with 80% yellow slough tissue and 20% red tissue, surrounding bed is macerated with mild erythema, no warmth or pain on palpation  Psychiatric: Affect normal.  Agitated for exam but able to f/c    Labs reviewed/Significant Diagnostic Results:  Basic Metabolic Panel:  Recent Labs  06/21/14 09/26/14 02/25/15  NA 136* 142 141  K 3.8  12.6* 3.7 4.1  BUN 24* 16 23*  CREATININE 0.8 0.7 1.0   Liver Function Tests:  Recent Labs  06/21/14 07/26/14 02/25/15  AST 21 18 26   ALT 9 8 15   ALKPHOS 70 76 120   No results for input(s): LIPASE, AMYLASE in the last 8760 hours. No results for input(s): AMMONIA in the last 8760 hours. CBC:  Recent Labs  06/26/14 10/06/14 02/25/15  WBC 6.4 7.9 7.7  HGB  12.3 13.0 11.7*  HCT 37 37 36  PLT 242 264 304   CBG: No results for input(s): GLUCAP in the last 8760 hours. TSH:  Recent Labs  08/21/14 10/06/14 02/28/15  TSH 0.04* 0.24* 1.47   A1C: Lab Results  Component Value Date   HGBA1C 5.5 12/20/2012   Lipid Panel:  Recent Labs  07/26/14  CHOL 120  HDL 40  LDLCALC 63  TRIG 85  Assessment/Plan  1. Chronic atrial fibrillation -improved with the addition of lopressor 25 mg BID -not on anticoagulation due to falls -f/u with cards 8/22  2. CHF, diastolic -add lasix 20mg  daily for 5 days with kdur 20 meq daily for 5 days -bmp on 8/11 -compression hose, on in the am and off in the pm -daily weight  3. Skin tear -has slough tissue and needs debridement -add santyl with gauze dressing and change daily -hopefully the compression hose will help the resident not to pull off the dressing and promote healing    Cindi Carbon, [place] [phone]

## 2015-03-18 NOTE — Addendum Note (Signed)
Addended by: Barnie Mort on: 03/18/2015 02:44 PM   Modules accepted: Level of Service

## 2015-03-26 LAB — HEPATIC FUNCTION PANEL
ALK PHOS: 98 U/L (ref 25–125)
ALT: 6 U/L — AB (ref 7–35)
AST: 19 U/L (ref 13–35)
BILIRUBIN DIRECT: 0.1 mg/dL (ref 0.01–0.4)
BILIRUBIN, TOTAL: 0.4 mg/dL

## 2015-03-26 LAB — CBC AND DIFFERENTIAL
HEMATOCRIT: 34 % — AB (ref 36–46)
Hemoglobin: 11.1 g/dL — AB (ref 12.0–16.0)
Platelets: 284 10*3/uL (ref 150–399)
WBC: 7 10^3/mL

## 2015-03-26 LAB — BASIC METABOLIC PANEL
BUN: 27 mg/dL — AB (ref 4–21)
Creatinine: 0.8 mg/dL (ref 0.5–1.1)
Potassium: 4.4 mmol/L (ref 3.4–5.3)
Sodium: 142 mmol/L (ref 137–147)

## 2015-03-29 ENCOUNTER — Encounter: Payer: Self-pay | Admitting: Adult Health

## 2015-04-01 ENCOUNTER — Ambulatory Visit (INDEPENDENT_AMBULATORY_CARE_PROVIDER_SITE_OTHER): Payer: Medicare PPO | Admitting: Physician Assistant

## 2015-04-01 ENCOUNTER — Encounter: Payer: Self-pay | Admitting: Physician Assistant

## 2015-04-01 VITALS — BP 94/60 | HR 71 | Ht 59.0 in | Wt 104.8 lb

## 2015-04-01 DIAGNOSIS — I480 Paroxysmal atrial fibrillation: Secondary | ICD-10-CM

## 2015-04-01 DIAGNOSIS — I10 Essential (primary) hypertension: Secondary | ICD-10-CM

## 2015-04-01 DIAGNOSIS — I5032 Chronic diastolic (congestive) heart failure: Secondary | ICD-10-CM | POA: Diagnosis not present

## 2015-04-01 MED ORDER — FUROSEMIDE 20 MG PO TABS: ORAL_TABLET | ORAL | Status: DC

## 2015-04-01 NOTE — Assessment & Plan Note (Signed)
Patient has had increase edema that responded well to Lasix for 5 days. She is well compensated today. No evidence of heart failure. Last 2-D echo was in 2014 at which time she had normal LV function. Recheck 2-D echo. 2 g sodium diet. Lasix 20 mg when necessary for 2-3 pound weight gain overnight or edema or shortness of breath. Follow-up with Dr. Luther Parody in 2-3 months. Spoke with her daughter Barbara [name] on the phone who understands and agrees.

## 2015-04-01 NOTE — Progress Notes (Signed)
Cardiology Office Note   Date:  04/01/2015   ID:  Aisley, Whan 01/27/1929, MRN [id number]  PCP:  Hollace Kinnier, DO  Cardiologist:  Dr. Candee Furbish Chief Complaint: edema    History of Present Illness: Barbara [name] is a 81 y.o. female who is in memory care at [facility] who presents for follow-up of CHF. She has a history of chronic atrial fibrillation now controlled with diltiazem and metoprolol. She had had some heart failure in the setting of rapid atrial fibrillation in the past. Last 2-D echo in 2014 showed mild concentric hypertrophy EF 60-65% with moderate to severely calcified mitral valve annulus.  She was seen at her nursing facility by nurse practitioner for increased edema and was given Lasix 20 mg daily for 5 days, and compression hose. Her heart rate was controlled at that time. She is not anticoagulated due to falls.  Patient returns today accompanied by her CNA. She denies any chest pain, palpitations, dyspnea, dyspnea on exertion, dizziness or presyncope. Her blood pressure was 140/98 at the nursing home this morning but after she took her medications at was down to 94/60. Her weight is 104 pounds which is down 2 pounds from her last office visit and 4 pounds from the nursing facility.    Past Medical History  Diagnosis Date  . Dysphagia     pharyngoesophageal phase  . Diarrhea   . Chronic atrial fibrillation   . Cervicalgia   . Low back pain   . Hemorrhage of anus and rectum   . Iron deficiency anemia due to chronic blood loss   . Anemia   . Cerebral infarction due to embolism of precerebral artery   . Gait abnormality   . Glaucoma, open angle   . Hypertension   . Long term current use of anticoagulant   . Anxiety disorder   . Mixed hyperlipidemia   . Hypothyroidism   . Dementia   . Macular degeneration   . Age related osteoporosis   . Atrial fibrillation 2009    Long term anticoagulation. Warfarin changed to Xarelto 06/2012  . Cystocele,  midline   . Senile dementia, uncomplicated   . Generalized anxiety disorder   . Unspecified glaucoma   . Macular degeneration (senile) of retina, unspecified   . Osteoarthrosis, unspecified whether generalized or localized, unspecified site   . Senile osteoporosis   . Pneumonia, organism unspecified 03/2012    Tx w/ PO antibx  . Unspecified vitamin D deficiency   . Unspecified venous (peripheral) insufficiency   . Tear film insufficiency, unspecified 08/2012  . Debility, unspecified 04/01/2012  . Long term (current) use of anticoagulants 04/01/2012  . CVA (cerebral infarction) 11/19/2012  . Anemia 11/2012    transfusion 11/2012  . Unspecified hypothyroidism   . Other and unspecified hyperlipidemia     Atorvastatin started 12/2012  . Benign paroxysmal positional vertigo 11/08/2013  . Glaucoma   . Stroke 2014  . Contusion, chest wall 11/27/2013    Left side.   Marland Kitchen History of fall 11/27/2013  . Wound of right leg 11/13/2013    posteriorly     Past Surgical History  Procedure Laterality Date  . Tonsillectomy and adenoidectomy    . Umbilical hernia repair    . Cataract extraction w/ intraocular lens  implant, bilateral    . Spine surgery      RE; Spinal stenosis  . Esophagogastroduodenoscopy (egd) with propofol N/A 11/21/2013    Procedure: ESOPHAGOGASTRODUODENOSCOPY (EGD) WITH PROPOFOL;  Surgeon: Herbie Baltimore  Ursula Beath, MD;  Location: [facility] ENDOS[facility];  Service: Endoscopy;  Laterality: N/A;  . Colonoscopy with propofol N/A 11/21/2013    Procedure: COLONOSCOPY WITH PROPOFOL;  Surgeon: Cleotis Nipper, MD;  Location: [facility] ENDOSCOPY;  Service: Endoscopy;  Laterality: N/A;     Current Outpatient Prescriptions  Medication Sig Dispense Refill  . acetaminophen (TYLENOL) 325 MG tablet Take 650 mg by mouth 3 (three) times daily.     Marland Kitchen albuterol (ACCUNEB) 1.25 MG/3ML nebulizer solution Take 1 ampule by nebulization every 6 (six) hours as needed for wheezing.    Marland Kitchen aspirin EC 81 MG tablet Take 81 mg by mouth  daily.    Marland Kitchen atorvastatin (LIPITOR) 10 MG tablet Take 10 mg by mouth daily at 6 PM. Take one tablet daily    . calcium carbonate (OS-CAL) 600 MG TABS tablet Take 600 mg by mouth daily with breakfast.    . cholecalciferol (VITAMIN D) 1000 UNITS tablet Take 2,000 Units by mouth daily.    Marland Kitchen diltiazem (CARDIZEM CD) 240 MG 24 hr capsule Take 1 capsule (240 mg total) by mouth daily. 30 capsule 6  . divalproex (DEPAKOTE) 250 MG DR tablet Take 250 mg by mouth 2 (two) times daily.    . dorzolamide-timolol (COSOPT) 22.3-6.8 MG/ML ophthalmic solution Place 1 drop into both eyes 2 (two) times daily.    . DULoxetine (CYMBALTA) 30 MG capsule Take 30 mg by mouth 2 (two) times daily.    . furosemide (LASIX) 20 MG tablet TAKE ONE TABLET AS NEEDED FOR WEIGHT GAIN OVERNIGHT OF 2 TO 3 LBS DUE TO EDEMA 30 tablet 3  . hydrocortisone 2.5 % cream Apply 1 application topically daily as needed (rash).    Marland Kitchen levothyroxine (SYNTHROID, LEVOTHROID) 75 MCG tablet Take 75 mcg by mouth daily before breakfast.    . memantine (NAMENDA XR) 28 MG CP24 24 hr capsule Take 28 mg by mouth every other day. If mood and balance worsen, resume Namenda XR 28 MG to by mouth daily    . metoprolol (LOPRESSOR) 50 MG tablet Take 50 mg by mouth twice daily if SBP > 100. If SBP is consistently below 100, notify MD @@[facility]  Cardiology for further orders    . mirtazapine (REMERON) 30 MG tablet Take 1 tablet (30 mg total) by mouth at bedtime. 30 tablet 3  . montelukast (SINGULAIR) 10 MG tablet Take 10 mg by mouth at bedtime.    . Multiple Vitamins-Minerals (PRESERVISION/LUTEIN) CAPS Take 2 capsules by mouth daily.    . nitroGLYCERIN (NITROSTAT) 0.4 MG SL tablet Place 0.4 mg under the tongue every 5 (five) minutes as needed for chest pain.    Marland Kitchen omeprazole (PRILOSEC) 20 MG capsule Take 20 mg by mouth daily.     . ondansetron (ZOFRAN ODT) 4 MG disintegrating tablet Take 1 tablet (4 mg total) by mouth every 8 (eight) hours as needed for nausea or vomiting. 20  tablet 0  . polyethylene glycol (MIRALAX / GLYCOLAX) packet Take 17 g by mouth daily. Mix with 6oz beverage of choice. Hold for loose stool    . Teriparatide, Recombinant, (FORTEO) 600 MCG/2.4ML SOLN Inject 20 mcg into the skin every morning.    . Travoprost, BAK Free, (TRAVATAN Z) 0.004 % SOLN ophthalmic solution Place 1 drop into both eyes daily.      No current facility-administered medications for this visit.    Allergies:   Aricept    Social History:  The patient  reports that she quit smoking about 34 years ago.  Her smoking use included Cigarettes. She has never used smokeless tobacco. She reports that she does not drink alcohol or use illicit drugs.   Family History:  The patient's family history includes Heart attack in her father.    ROS:  Please see the history of present illness.   Otherwise, review of systems are positive for walks with walker with assistance, in a wheelchair.   All other systems are reviewed and negative.    PHYSICAL EXAM: VS:  BP 94/60 mmHg  Pulse 71  Ht 4\' 11"  (1.499 m)  Wt 104 lb 12.8 oz (47.537 kg)  BMI 21.16 kg/m2 , BMI Body mass index is 21.16 kg/(m^2). GEN: Well nourished, well developed, in no acute distress Neck: no JVD, HJR, carotid bruits, or masses Cardiac: RRR; 2/6 systolic murmur at the left sternal border, no gallop, rubs, thrill or heave,  Respiratory:  clear to auscultation bilaterally, normal work of breathing GI: soft, nontender, nondistended, + BS MS: no deformity or atrophy Extremities: without cyanosis, clubbing, edema, good distal pulses bilaterally.  Skin: warm and dry, no rash Neuro:  Strength and sensation are intact    EKG:  EKG is ordered today. The ekg ordered today demonstrates atrial fibrillation at 71 bpm, nonspecific ST-T wave changes, no acute change   Recent Labs: 02/25/2015: ALT 15; BUN 23*; Creatinine 1.0; Hemoglobin 11.7*; Platelets 304; Potassium 4.1; Sodium 141 02/28/2015: TSH 1.47    Lipid Panel     Component Value Date/Time   CHOL 120 07/26/2014   TRIG 85 07/26/2014   HDL 40 07/26/2014   CHOLHDL 3.5 12/20/2012 0555   VLDL 22 12/20/2012 0555   LDLCALC 63 07/26/2014      Wt Readings from Last 3 Encounters:  04/01/15 104 lb 12.8 oz (47.537 kg)  03/18/15 108 lb (48.988 kg)  03/05/15 106 lb 3.2 oz (48.172 kg)      Other studies Reviewed: Additional studies/ records that were reviewed today include and review of the records demonstrates:  Echo 12/20/12 - Mild concentric hypertrophy. EF 60% to 65%. Wall motion was normal;  - Mitral valve: Moderately to severely calcified annulus. Mildly thickened leaflets . Mild to moderate regurgitation. Impressions:  - No cardiac source of emboli was indentified.  Carotid US 12/20/12 No significant extracranial carotid artery stenosis demonstrated.      ASSESSMENT AND PLAN: Congestive heart failure Patient has had increase edema that responded well to Lasix for 5 days. She is well compensated today. No evidence of heart failure. Last 2-D echo was in 2014 at which time she had normal LV function. Recheck 2-D echo. 2 g sodium diet. Lasix 20 mg when necessary for 2-3 pound weight gain overnight or edema or shortness of breath. Follow-up with Dr. Luther Parody in 2-3 months. Spoke with her daughter [name] on the phone who understands and agrees.  Atrial fibrillation Patient's heart rate is well controlled on diltiazem and metoprolol. She is high risk for fall and not on anticoagulation.  Essential hypertension Patient's blood pressure was elevated this morning at [facility] but after she was given her medication and came down nicely. No change in medications.     Sumner Boast, PA-C  04/01/2015 10:55 AM    [facility] [address], [place], [place]  [address] Phone: [phone]; Fax: [phone]

## 2015-04-01 NOTE — Assessment & Plan Note (Signed)
Patient's blood pressure was elevated this morning at [facility] but after she was given her medication and came down nicely. No change in medications.

## 2015-04-01 NOTE — Patient Instructions (Addendum)
Medication Instructions:   START TAKING LASIX AS NEEDED FOR OVERNIGHT WEIGHT GAIN OF 2 TO 3 LBS DUE TO EDEMA    Labwork:  NONE ORDER TODAY    Testing/Procedures:  Your physician has requested that you have an echocardiogram. Echocardiography is a painless test that uses sound waves to create images of your heart. It provides your doctor with information about the size and shape of your heart and how well your heart's chambers and valves are working. This procedure takes approximately one hour. There are no restrictions for this procedure.   Your physician has requested that you have an echocardiogram. Echocardiography is a painless test that uses sound waves to create images of your heart. It provides your doctor with information about the size and shape of your heart and how well your heart's chambers and valves are working. This procedure takes approximately one hour. There are no restrictions for this procedure.   Follow-Up:  WITH DR Marlou Porch IN 2 TO 3 MONTHS     Any Other Special Instructions Will Be Listed Below (If Applicable).   Low-Sodium Eating Plan Sodium raises blood pressure and causes water to be held in the body. Getting less sodium from food will help lower your blood pressure, reduce any swelling, and protect your heart, liver, and kidneys. We get sodium by adding salt (sodium chloride) to food. Most of our sodium comes from canned, boxed, and frozen foods. Restaurant foods, fast foods, and pizza are also very high in sodium. Even if you take medicine to lower your blood pressure or to reduce fluid in your body, getting less sodium from your food is important. WHAT IS MY PLAN? Most people should limit their sodium intake to 2,000 mg a day.  WHAT DO I NEED TO KNOW ABOUT THIS EATING PLAN? For the low-sodium eating plan, you will follow these general guidelines:  Choose foods with a % Daily Value for sodium of less than 5% (as listed on the food label).   Use  salt-free seasonings or herbs instead of table salt or sea salt.   Check with your health care provider or pharmacist before using salt substitutes.   Eat fresh foods.  Eat more vegetables and fruits.  Limit canned vegetables. If you do use them, rinse them well to decrease the sodium.   Limit cheese to 1 oz (28 g) per day.   Eat lower-sodium products, often labeled as "lower sodium" or "no salt added."  Avoid foods that contain monosodium glutamate (MSG). MSG is sometimes added to [place] food and some canned foods.  Check food labels (Nutrition Facts labels) on foods to learn how much sodium is in one serving.  Eat more home-cooked food and less restaurant, buffet, and fast food.  When eating at a restaurant, ask that your food be prepared with less salt or none, if possible.  HOW DO I READ FOOD LABELS FOR SODIUM INFORMATION? The Nutrition Facts label lists the amount of sodium in one serving of the food. If you eat more than one serving, you must multiply the listed amount of sodium by the number of servings. Food labels may also identify foods as:  Sodium free--Less than 5 mg in a serving.  Very low sodium--35 mg or less in a serving.  Low sodium--140 mg or less in a serving.  Light in sodium--50% less sodium in a serving. For example, if a food that usually has 300 mg of sodium is changed to become light in sodium, it will have 150  mg of sodium.  Reduced sodium--25% less sodium in a serving. For example, if a food that usually has 400 mg of sodium is changed to reduced sodium, it will have 300 mg of sodium. WHAT FOODS CAN I EAT? Grains Low-sodium cereals, including oats, puffed wheat and rice, and shredded wheat cereals. Low-sodium crackers. Unsalted rice and pasta. Lower-sodium bread.  Vegetables Frozen or fresh vegetables. Low-sodium or reduced-sodium canned vegetables. Low-sodium or reduced-sodium tomato sauce and paste. Low-sodium or reduced-sodium tomato and  vegetable juices.  Fruits Fresh, frozen, and canned fruit. Fruit juice.  Meat and Other Protein Products Low-sodium canned tuna and salmon. Fresh or frozen meat, poultry, seafood, and fish. Lamb. Unsalted nuts. Dried beans, peas, and lentils without added salt. Unsalted canned beans. Homemade soups without salt. Eggs.  Dairy Milk. Soy milk. Ricotta cheese. Low-sodium or reduced-sodium cheeses. Yogurt.  Condiments Fresh and dried herbs and spices. Salt-free seasonings. Onion and garlic powders. Low-sodium varieties of mustard and ketchup. Lemon juice.  Fats and Oils Reduced-sodium salad dressings. Unsalted butter.  Other Unsalted popcorn and pretzels.  The items listed above may not be a complete list of recommended foods or beverages. Contact your dietitian for more options. WHAT FOODS ARE NOT RECOMMENDED? Grains Instant hot cereals. Bread stuffing, pancake, and biscuit mixes. Croutons. Seasoned rice or pasta mixes. Noodle soup cups. Boxed or frozen macaroni and cheese. Self-rising flour. Regular salted crackers. Vegetables Regular canned vegetables. Regular canned tomato sauce and paste. Regular tomato and vegetable juices. Frozen vegetables in sauces. Salted french fries. Olives. Angie Fava. Relishes. Sauerkraut. Salsa. Meat and Other Protein Products Salted, canned, smoked, spiced, or pickled meats, seafood, or fish. Bacon, ham, sausage, hot dogs, corned beef, chipped beef, and packaged luncheon meats. Salt pork. Jerky. Pickled herring. Anchovies, regular canned tuna, and sardines. Salted nuts. Dairy Processed cheese and cheese spreads. Cheese curds. Blue cheese and cottage cheese. Buttermilk.  Condiments Onion and garlic salt, seasoned salt, table salt, and sea salt. Canned and packaged gravies. Worcestershire sauce. Tartar sauce. Barbecue sauce. Teriyaki sauce. Soy sauce, including reduced sodium. Steak sauce. Fish sauce. Oyster sauce. Cocktail sauce. Horseradish. Regular  ketchup and mustard. Meat flavorings and tenderizers. Bouillon cubes. Hot sauce. Tabasco sauce. Marinades. Taco seasonings. Relishes. Fats and Oils Regular salad dressings. Salted butter. Margarine. Ghee. Bacon fat.  Other Potato and tortilla chips. Corn chips and puffs. Salted popcorn and pretzels. Canned or dried soups. Pizza. Frozen entrees and pot pies.  The items listed above may not be a complete list of foods and beverages to avoid. Contact your dietitian for more information. Document Released: 01/16/2002 Document Revised: 08/01/2013 Document Reviewed: 05/31/2013 [facility] Patient Information 2015 [place], [place]. This information is not intended to replace advice given to you by your health care provider. Make sure you discuss any questions you have with your health care provider.

## 2015-04-01 NOTE — Assessment & Plan Note (Signed)
Patient's heart rate is well controlled on diltiazem and metoprolol. She is high risk for fall and not on anticoagulation.

## 2015-04-08 ENCOUNTER — Ambulatory Visit (HOSPITAL_COMMUNITY): Payer: Medicare PPO | Attending: Cardiology

## 2015-04-08 ENCOUNTER — Other Ambulatory Visit: Payer: Self-pay

## 2015-04-08 DIAGNOSIS — I517 Cardiomegaly: Secondary | ICD-10-CM | POA: Diagnosis not present

## 2015-04-08 DIAGNOSIS — E785 Hyperlipidemia, unspecified: Secondary | ICD-10-CM | POA: Insufficient documentation

## 2015-04-08 DIAGNOSIS — I071 Rheumatic tricuspid insufficiency: Secondary | ICD-10-CM | POA: Diagnosis not present

## 2015-04-08 DIAGNOSIS — I340 Nonrheumatic mitral (valve) insufficiency: Secondary | ICD-10-CM | POA: Insufficient documentation

## 2015-04-08 DIAGNOSIS — I351 Nonrheumatic aortic (valve) insufficiency: Secondary | ICD-10-CM | POA: Diagnosis not present

## 2015-04-08 DIAGNOSIS — I480 Paroxysmal atrial fibrillation: Secondary | ICD-10-CM | POA: Diagnosis present

## 2015-04-09 ENCOUNTER — Encounter: Payer: Self-pay | Admitting: *Deleted

## 2015-04-18 ENCOUNTER — Telehealth: Payer: Self-pay | Admitting: Internal Medicine

## 2015-04-18 NOTE — Telephone Encounter (Signed)
She lives in the memory unit at [facility] so they should be taking care of it there.

## 2015-04-18 NOTE — Telephone Encounter (Signed)
Patients last Prolia injections was 08/2013.  Would you like patient to continue? Caren Griffins

## 2015-04-25 ENCOUNTER — Encounter: Payer: Self-pay | Admitting: Adult Health

## 2015-04-25 ENCOUNTER — Non-Acute Institutional Stay: Payer: Medicare PPO | Admitting: Adult Health

## 2015-04-25 DIAGNOSIS — I6389 Other cerebral infarction: Secondary | ICD-10-CM

## 2015-04-25 DIAGNOSIS — S81801D Unspecified open wound, right lower leg, subsequent encounter: Secondary | ICD-10-CM | POA: Diagnosis not present

## 2015-04-25 DIAGNOSIS — I480 Paroxysmal atrial fibrillation: Secondary | ICD-10-CM | POA: Diagnosis not present

## 2015-04-25 DIAGNOSIS — I638 Other cerebral infarction: Secondary | ICD-10-CM | POA: Diagnosis not present

## 2015-04-25 DIAGNOSIS — I5032 Chronic diastolic (congestive) heart failure: Secondary | ICD-10-CM | POA: Diagnosis not present

## 2015-04-25 DIAGNOSIS — F0391 Unspecified dementia with behavioral disturbance: Secondary | ICD-10-CM

## 2015-04-25 DIAGNOSIS — M810 Age-related osteoporosis without current pathological fracture: Secondary | ICD-10-CM | POA: Diagnosis not present

## 2015-04-25 DIAGNOSIS — F03918 Unspecified dementia, unspecified severity, with other behavioral disturbance: Secondary | ICD-10-CM

## 2015-04-25 NOTE — Progress Notes (Signed)
Patient ID: Barbara [name], female   DOB: Oct 08, 1928, 81 y.o.   MRN: [id number]    Nursing Home Location:  [place]    Patient Care Team: Gayland Curry, DO as PCP - General (Geriatric Medicine) Well [place], DO (Geriatric Medicine) Royal Hawthorn, NP as Nurse Practitioner (Nurse Practitioner)  Place of Service: ALF [phone])  Chief Complaint  Patient presents with  . Medical Management of Chronic Issues    HPI:  81 y.o. female residing at [facility], memory care unit. I am here to review her chronic medical issues. She has a hx of CHF, CVA, dementia with behaviors, falls, afib, poor vision, dysphagia, OP and anxiety. Her weight has trended downward to 99 lbs due to decreased intake and diuretic therapy.  She has a hx of afib but is no longer on anticoagulation due to falls. She had an exacerbation of CHF due to rapid afib last month and was treated with increased dosing of BB and lasix. Her echo shows a normal EF with several mitral annulas calcification. She is followed by cards.  She fell last month and sustained a skin tear to the right lower leg. The area has been difficult to heal and has been receiving santyl with dressing changes.  She has had many issues with agitation. Namenda was decreased to 14 mg recently and depakote added with improvement noted by the staff in agitation.  Review of Systems:  Review of Systems  Constitutional: Negative for fever, chills, activity change and appetite change.  Respiratory: Negative for cough and shortness of breath.   Cardiovascular: Negative for chest pain, palpitations and leg swelling.  Gastrointestinal: Negative for abdominal pain and abdominal distention.  Genitourinary: Negative for dysuria.  Musculoskeletal: Positive for gait problem.  Skin: Positive for wound. Negative for pallor and rash.  Neurological: Negative for dizziness, facial asymmetry,  speech difficulty and weakness.  Psychiatric/Behavioral: Positive for behavioral problems and confusion. Negative for agitation.    Medications: Patient's Medications  New Prescriptions   No medications on file  Previous Medications   ACETAMINOPHEN (TYLENOL) 325 MG TABLET    Take 650 mg by mouth 3 (three) times daily.    ALBUTEROL (ACCUNEB) 1.25 MG/3ML NEBULIZER SOLUTION    Take 1 ampule by nebulization every 6 (six) hours as needed for wheezing.   ASPIRIN EC 81 MG TABLET    Take 81 mg by mouth daily.   ATORVASTATIN (LIPITOR) 10 MG TABLET    Take 10 mg by mouth daily at 6 PM. Take one tablet daily   CALCIUM CARBONATE (OS-CAL) 600 MG TABS TABLET    Take 600 mg by mouth daily with breakfast.   CHOLECALCIFEROL (VITAMIN D) 1000 UNITS TABLET    Take 2,000 Units by mouth daily.   DILTIAZEM (CARDIZEM CD) 240 MG 24 HR CAPSULE    Take 1 capsule (240 mg total) by mouth daily.   DIVALPROEX (DEPAKOTE) 250 MG DR TABLET    Take 250 mg by mouth 2 (two) times daily.   DORZOLAMIDE-TIMOLOL (COSOPT) 22.3-6.8 MG/ML OPHTHALMIC SOLUTION    Place 1 drop into both eyes 2 (two) times daily.   DULOXETINE (CYMBALTA) 30 MG CAPSULE    Take 30 mg by mouth 2 (two) times daily.   FUROSEMIDE (LASIX) 20 MG TABLET    TAKE ONE TABLET AS NEEDED FOR WEIGHT GAIN OVERNIGHT OF 2 TO 3 LBS DUE TO EDEMA   HYDROCORTISONE 2.5 % CREAM    Apply 1 application topically  daily as needed (rash).   LEVOTHYROXINE (SYNTHROID, LEVOTHROID) 75 MCG TABLET    Take 75 mcg by mouth daily before breakfast.   METOPROLOL (LOPRESSOR) 50 MG TABLET    Take 50 mg by mouth twice daily if SBP > 100. If SBP is consistently below 100, notify MD @@[facility]  Cardiology for further orders   MIRTAZAPINE (REMERON) 30 MG TABLET    Take 1 tablet (30 mg total) by mouth at bedtime.   MONTELUKAST (SINGULAIR) 10 MG TABLET    Take 10 mg by mouth at bedtime.   MULTIPLE VITAMINS-MINERALS (PRESERVISION/LUTEIN) CAPS    Take 2 capsules by mouth daily.   NITROGLYCERIN (NITROSTAT) 0.4  MG SL TABLET    Place 0.4 mg under the tongue every 5 (five) minutes as needed for chest pain.   OMEPRAZOLE (PRILOSEC) 20 MG CAPSULE    Take 20 mg by mouth daily.    ONDANSETRON (ZOFRAN ODT) 4 MG DISINTEGRATING TABLET    Take 1 tablet (4 mg total) by mouth every 8 (eight) hours as needed for nausea or vomiting.   POLYETHYLENE GLYCOL (MIRALAX / GLYCOLAX) PACKET    Take 17 g by mouth daily. Mix with 6oz beverage of choice. Hold for loose stool   TERIPARATIDE, RECOMBINANT, (FORTEO) 600 MCG/2.4ML SOLN    Inject 20 mcg into the skin every morning.   TRAVOPROST, BAK FREE, (TRAVATAN Z) 0.004 % SOLN OPHTHALMIC SOLUTION    Place 1 drop into both eyes daily.   Modified Medications   No medications on file  Discontinued Medications   MEMANTINE (NAMENDA XR) 28 MG CP24 24 HR CAPSULE    Take 28 mg by mouth every other day. If mood and balance worsen, resume Namenda XR 28 MG to by mouth daily     Physical Exam:  Filed Vitals:   04/25/15 1015  BP: 122/79  Pulse: 70  Temp: 97.8 F (36.6 C)  Resp: 20  Weight: 99 lb 8 oz (45.133 kg)  SpO2: 96%   Wt Readings from Last 3 Encounters:  04/25/15 99 lb 8 oz (45.133 kg)  04/01/15 104 lb 12.8 oz (47.537 kg)  03/18/15 108 lb (48.988 kg)     Physical Exam  Constitutional: No distress.  frail  Neck: Normal range of motion. Neck supple. No JVD present.  Cardiovascular: Exam reveals no gallop and no friction rub.   No murmur heard. irreg irreg, no edema  Pulmonary/Chest: Effort normal and breath sounds normal. No respiratory distress. She has no wheezes.  Abdominal: Soft. Bowel sounds are normal. She exhibits no distension. There is no tenderness.  Neurological: She is alert.  No focal deficit, MAE, able to f/c. Oriented to self only  Skin: Skin is warm and dry. She is not diaphoretic.  Right inner calf skin tear healing with 100% pink tissue, no drainage or erythema  Psychiatric: Affect normal.  pleasant    Labs reviewed/Significant Diagnostic  Results:  Basic Metabolic Panel:  Recent Labs  01/03/15 2017 02/25/15 03/26/15  NA 141 141 142  K 3.9 4.1 4.4  CL 106  --   --   CO2 22  --   --   GLUCOSE 118*  --   --   BUN 25* 23* 27*  CREATININE 0.83 1.0 0.8  CALCIUM 9.2  --   --    Liver Function Tests:  Recent Labs  01/03/15 2017 02/25/15 03/26/15  AST 37 26 19  ALT 12* 15 6*  ALKPHOS 118 120 98  BILITOT 0.8  --   --  PROT 7.8  --   --   ALBUMIN 4.1  --   --     Recent Labs  01/03/15 2017  LIPASE 39   No results for input(s): AMMONIA in the last 8760 hours. CBC:  Recent Labs  01/03/15 2017 02/25/15 03/26/15  WBC 14.9* 7.7 7.0  NEUTROABS 12.6*  --   --   HGB 13.6 11.7* 11.1*  HCT 42.4 36 34*  MCV 102.4*  --   --   PLT 293 304 284   CBG: No results for input(s): GLUCAP in the last 8760 hours. TSH:  Recent Labs  08/21/14 10/06/14 02/28/15  TSH 0.04* 0.24* 1.47   A1C: Lab Results  Component Value Date   HGBA1C 5.5 12/20/2012   Lipid Panel:  Recent Labs  07/26/14  CHOL 120  HDL 40  LDLCALC 63  TRIG 85    Echo reviewed from 04/08/15  Study Conclusions  - Left ventricle: The cavity size was normal. There was mild focal basal hypertrophy of the septum. Systolic function was normal. The estimated ejection fraction was in the range of 55% to 60%. Wall motion was normal; there were no regional wall motion abnormalities. - Aortic valve: There was mild regurgitation. - Mitral valve: Calcified annulus. There was mild regurgitation. - Left atrium: The atrium was severely dilated. - Right atrium: The atrium was mildly dilated. - Tricuspid valve: There was moderate regurgitation. - Pulmonary arteries: Systolic pressure was mildly increased.  Impressions:  - Normal LV function; proximal septal thickening; sclerotic aortic valve with mild AI; severe MAC with mild MR; severe LAE; mild RAE; moderate TR; mildly elevated pulmonary pressure.  Assessment/Plan  1. Leg wound, right,  subsequent encounter -improved continue, current wound care  2. Paroxysmal atrial fibrillation -rate controlled, need consistent control of rate to prevent another CHF exacerbation  3. Senile osteoporosis -no new fractures despite many falls -continue forteo, ca, and vit d  4. Dementia with behavioral disturbance -improved with reduced dosing of namenda and depakote -monitor intake due to weight loss, TSH ok  5. Cerebral infarction due to other mechanism -no new events, on aspirin only due to falls  6. Chronic diastolic congestive heart failure -compensated -weight down   Cindi Carbon, [place] [phone]

## 2015-06-20 ENCOUNTER — Non-Acute Institutional Stay: Payer: Medicare PPO | Admitting: Adult Health

## 2015-06-20 ENCOUNTER — Encounter: Payer: Self-pay | Admitting: Adult Health

## 2015-06-20 DIAGNOSIS — E039 Hypothyroidism, unspecified: Secondary | ICD-10-CM | POA: Diagnosis not present

## 2015-06-20 DIAGNOSIS — I5032 Chronic diastolic (congestive) heart failure: Secondary | ICD-10-CM | POA: Diagnosis not present

## 2015-06-20 DIAGNOSIS — F0391 Unspecified dementia with behavioral disturbance: Secondary | ICD-10-CM | POA: Diagnosis not present

## 2015-06-20 DIAGNOSIS — I480 Paroxysmal atrial fibrillation: Secondary | ICD-10-CM | POA: Diagnosis not present

## 2015-06-20 DIAGNOSIS — F411 Generalized anxiety disorder: Secondary | ICD-10-CM

## 2015-06-20 DIAGNOSIS — F03918 Unspecified dementia, unspecified severity, with other behavioral disturbance: Secondary | ICD-10-CM

## 2015-06-20 NOTE — Progress Notes (Signed)
Patient ID: Barbara [name], female   DOB: [birth year], 81 y.o.   MRN: [id number]     Nursing Home Location:  [place]    Patient Care Team: Gayland Curry, DO as PCP - General (Geriatric Medicine) Well [place], DO (Geriatric Medicine) Royal Hawthorn, NP as Nurse Practitioner (Nurse Practitioner)  Place of Service: ALF [phone])  Chief Complaint  Patient presents with  . Acute Visit    agitation    HPI:  81 y.o. female residing at [facility], memory care unit. I was asked to see her today for increased agitation. She has a hx of dementia that is most likely mixed and a hx of recurrent CVA.  Nsg reports indicated that she has been hallucinating that there is water on her feet, that her walker is a dead body, and that she is looking for children. She has been physically and verbally abusive to the staff as well. Her behaviors have periods of waxing and waning but have gotten worse over the past week. She was found on the floor this morning with no brief on and a BM in the floor.  She has not had a fever or dysuria, cough, congestion, SOB, CP, etc.  The staff dipped her urine which was positive for leukocytes.   Review of Systems:  Review of Systems  Constitutional: Negative for fever, chills, diaphoresis, activity change and appetite change.  HENT: Negative for congestion and sore throat.   Respiratory: Negative for cough and shortness of breath.   Cardiovascular: Positive for leg swelling. Negative for chest pain and palpitations.  Gastrointestinal: Negative for abdominal pain, diarrhea, constipation and abdominal distention.  Genitourinary: Negative for dysuria, flank pain and difficulty urinating.  Musculoskeletal: Positive for arthralgias and gait problem.  Skin: Negative for pallor and rash.  Neurological: Negative for dizziness, facial asymmetry, speech difficulty and weakness.  Psychiatric/Behavioral:  Positive for hallucinations, behavioral problems, confusion and agitation. Negative for suicidal ideas and self-injury.    Medications: Patient's Medications  New Prescriptions   No medications on file  Previous Medications   ACETAMINOPHEN (TYLENOL) 325 MG TABLET    Take 650 mg by mouth 3 (three) times daily.    ALBUTEROL (ACCUNEB) 1.25 MG/3ML NEBULIZER SOLUTION    Take 1 ampule by nebulization every 6 (six) hours as needed for wheezing.   ASPIRIN EC 81 MG TABLET    Take 81 mg by mouth daily.   ATORVASTATIN (LIPITOR) 10 MG TABLET    Take 10 mg by mouth daily at 6 PM. Take one tablet daily   CALCIUM CARBONATE (OS-CAL) 600 MG TABS TABLET    Take 600 mg by mouth daily with breakfast.   CHOLECALCIFEROL (VITAMIN D) 1000 UNITS TABLET    Take 2,000 Units by mouth daily.   DILTIAZEM (CARDIZEM CD) 240 MG 24 HR CAPSULE    Take 1 capsule (240 mg total) by mouth daily.   DIVALPROEX (DEPAKOTE) 250 MG DR TABLET    Take 250 mg by mouth 2 (two) times daily.   DORZOLAMIDE-TIMOLOL (COSOPT) 22.3-6.8 MG/ML OPHTHALMIC SOLUTION    Place 1 drop into both eyes 2 (two) times daily.   DULOXETINE (CYMBALTA) 30 MG CAPSULE    Take 30 mg by mouth 2 (two) times daily.   FUROSEMIDE (LASIX) 20 MG TABLET    TAKE ONE TABLET AS NEEDED FOR WEIGHT GAIN OVERNIGHT OF 2 TO 3 LBS DUE TO EDEMA   HYDROCORTISONE 2.5 % CREAM    Apply 1  application topically daily as needed (rash).   LEVOTHYROXINE (SYNTHROID, LEVOTHROID) 75 MCG TABLET    Take 75 mcg by mouth daily before breakfast.   METOPROLOL (LOPRESSOR) 50 MG TABLET    Take 50 mg by mouth twice daily if SBP > 100. If SBP is consistently below 100, notify MD @@[facility]  Cardiology for further orders   MIRTAZAPINE (REMERON) 30 MG TABLET    Take 1 tablet (30 mg total) by mouth at bedtime.   MONTELUKAST (SINGULAIR) 10 MG TABLET    Take 10 mg by mouth at bedtime.   MULTIPLE VITAMINS-MINERALS (PRESERVISION/LUTEIN) CAPS    Take 2 capsules by mouth daily.   NITROGLYCERIN (NITROSTAT) 0.4 MG SL  TABLET    Place 0.4 mg under the tongue every 5 (five) minutes as needed for chest pain.   OMEPRAZOLE (PRILOSEC) 20 MG CAPSULE    Take 20 mg by mouth daily.    ONDANSETRON (ZOFRAN ODT) 4 MG DISINTEGRATING TABLET    Take 1 tablet (4 mg total) by mouth every 8 (eight) hours as needed for nausea or vomiting.   POLYETHYLENE GLYCOL (MIRALAX / GLYCOLAX) PACKET    Take 17 g by mouth daily. Mix with 6oz beverage of choice. Hold for loose stool   TERIPARATIDE, RECOMBINANT, (FORTEO) 600 MCG/2.4ML SOLN    Inject 20 mcg into the skin every morning.   TRAVOPROST, BAK FREE, (TRAVATAN Z) 0.004 % SOLN OPHTHALMIC SOLUTION    Place 1 drop into both eyes daily.   Modified Medications   No medications on file  Discontinued Medications   No medications on file     Physical Exam:  Filed Vitals:   06/20/15 1207  BP: 144/94  Pulse: 74  Temp: 97.4 F (36.3 C)  Resp: 18  Weight: 106 lb (48.081 kg)    Physical Exam  Constitutional: No distress.  frail  HENT:  Head: Normocephalic and atraumatic.  Neck: Normal range of motion. Neck supple. No JVD present.  Cardiovascular: Exam reveals no gallop and no friction rub.   No murmur heard. irreg irreg, trace BLE edema  Pulmonary/Chest: Effort normal. No respiratory distress. She has no wheezes.  Bibasilar crackles  Abdominal: Soft. Bowel sounds are normal. She exhibits no distension. There is no tenderness.  Musculoskeletal: Normal range of motion. She exhibits no edema or tenderness.  Neurological: She is alert.  No focal deficit, MAE, able to f/c. Oriented to self only  Skin: Skin is warm and dry. She is not diaphoretic.  Psychiatric: Affect normal.  pleasant    Labs reviewed/Significant Diagnostic Results:  Basic Metabolic Panel:  Recent Labs  01/03/15 2017 02/25/15 03/26/15  NA 141 141 142  K 3.9 4.1 4.4  CL 106  --   --   CO2 22  --   --   GLUCOSE 118*  --   --   BUN 25* 23* 27*  CREATININE 0.83 1.0 0.8  CALCIUM 9.2  --   --    Liver  Function Tests:  Recent Labs  01/03/15 2017 02/25/15 03/26/15  AST 37 26 19  ALT 12* 15 6*  ALKPHOS 118 120 98  BILITOT 0.8  --   --   PROT 7.8  --   --   ALBUMIN 4.1  --   --     Recent Labs  01/03/15 2017  LIPASE 39   No results for input(s): AMMONIA in the last 8760 hours. CBC:  Recent Labs  01/03/15 2017 02/25/15 03/26/15  WBC 14.9* 7.7 7.0  NEUTROABS  12.6*  --   --   HGB 13.6 11.7* 11.1*  HCT 42.4 36 34*  MCV 102.4*  --   --   PLT 293 304 284   CBG: No results for input(s): GLUCAP in the last 8760 hours. TSH:  Recent Labs  08/21/14 10/06/14 02/28/15  TSH 0.04* 0.24* 1.47   A1C: Lab Results  Component Value Date   HGBA1C 5.5 12/20/2012   Lipid Panel:  Recent Labs  07/26/14  CHOL 120  HDL 40  LDLCALC 63  TRIG 85       Assessment/Plan  1. Dementia with behavioral disturbance -continue Namenda at 14 mg, originally decreased to help with behaviors -continue depakote 250 mg BID -consider seroquel as recommended by the memory clinic once we have ruled out other causes -UA C and S due acute hallucinations -ativan 0.5 mg q 8 hrs prn for extreme agitation, should try to redirect first   2. Hypothyroidism, unspecified hypothyroidism type -stable, continue current dose, TSH annually  3. Generalized anxiety disorder -does not appear anxious at this time but does have agitation more so in the evening -continue Remeron -once behavior are better controlled would consider discontinuing Cymbalta as it has not seemed to have helped  4. Paroxysmal atrial fibrillation (HCC) -rate controlled, only on aspirin due to falls -no new events  5. Chronic diastolic congestive heart failure (HCC) -weight stable, no signs of overload -continue prn lasix order and ted hose    Cindi Carbon, [place] [phone]

## 2015-07-08 ENCOUNTER — Encounter: Payer: Self-pay | Admitting: Cardiology

## 2015-07-08 ENCOUNTER — Ambulatory Visit (INDEPENDENT_AMBULATORY_CARE_PROVIDER_SITE_OTHER): Payer: Medicare PPO | Admitting: Cardiology

## 2015-07-08 VITALS — BP 112/68 | HR 76 | Ht 59.0 in | Wt 101.8 lb

## 2015-07-08 DIAGNOSIS — F0391 Unspecified dementia with behavioral disturbance: Secondary | ICD-10-CM

## 2015-07-08 DIAGNOSIS — F03918 Unspecified dementia, unspecified severity, with other behavioral disturbance: Secondary | ICD-10-CM

## 2015-07-08 DIAGNOSIS — I4819 Other persistent atrial fibrillation: Secondary | ICD-10-CM

## 2015-07-08 DIAGNOSIS — I481 Persistent atrial fibrillation: Secondary | ICD-10-CM

## 2015-07-08 NOTE — Patient Instructions (Signed)

## 2015-07-08 NOTE — Progress Notes (Signed)
Cardiology Office Note   Date:  07/08/2015   ID:  Barbara [name], DOB 1928/12/23, MRN [id number]  PCP:  Hollace Kinnier, DO  Cardiologist:   Candee Furbish, MD   Atrial fibrillation follow-up She currently resides at [facility], memory care unit.    History of Present Illness: Barbara [name] is a 81 y.o. female who presents for atrial fibrillation follow-up. Persistent atrial fibrillation. First episode detected was in July 2009, stress test previous to that in 2008 was low risk with ejection fraction of 70%, stroke in May 2014 here for follow-up.  She had previously fallen several times therefore she is only on aspirin. She was previously on Eliquis.  In 2014 she developed anemia, Dr. [name] with gastroenterology helped her through this situation. When her anticoagulation was stopped, she unfortunately had another stroke. Capsule endoscopy 01/16/14 was normal.  She has had falls, bruising, right hand hematoma, right knee, right hip and has been increasingly unsteady secondary to her dementia, poor coordination and visual deficit. Orthopedics examined, only hematoma. No breaks.  Barbara [name], her daughter, has discussed this/is aware of recent falls. Barbara [name], ANP note reviewed with [facility].   I reviewed an office visit from 06/20/15 from Royal Hawthorn, NP were she was seeing her for increased agitation.  Steady. Uses a walker. Wheelchair for travel.     Past Medical History  Diagnosis Date  . Dysphagia     pharyngoesophageal phase  . Diarrhea   . Chronic atrial fibrillation ([place])   . Cervicalgia   . Low back pain   . Hemorrhage of anus and rectum   . Iron deficiency anemia due to chronic blood loss   . Anemia   . Cerebral infarction due to embolism of precerebral artery ([place])   . Gait abnormality   . Glaucoma, open angle   . Hypertension   . Long term current use of anticoagulant   . Anxiety disorder   . Mixed  hyperlipidemia   . Hypothyroidism   . Dementia   . Macular degeneration   . Age related osteoporosis   . Atrial fibrillation ([place]) 2009    Long term anticoagulation. Warfarin changed to Xarelto 06/2012  . Cystocele, midline   . Senile dementia, uncomplicated   . Generalized anxiety disorder   . Unspecified glaucoma   . Macular degeneration (senile) of retina, unspecified   . Osteoarthrosis, unspecified whether generalized or localized, unspecified site   . Senile osteoporosis   . Pneumonia, organism unspecified 03/2012    Tx w/ PO antibx  . Unspecified vitamin D deficiency   . Unspecified venous (peripheral) insufficiency   . Tear film insufficiency, unspecified 08/2012  . Debility, unspecified 04/01/2012  . Long term (current) use of anticoagulants 04/01/2012  . CVA (cerebral infarction) 11/19/2012  . Anemia 11/2012    transfusion 11/2012  . Unspecified hypothyroidism   . Other and unspecified hyperlipidemia     Atorvastatin started 12/2012  . Benign paroxysmal positional vertigo 11/08/2013  . Glaucoma   . Stroke ([place]) 2014  . Contusion, chest wall 11/27/2013    Left side.   Marland Kitchen History of fall 11/27/2013  . Wound of right leg 11/13/2013    posteriorly     Past Surgical History  Procedure Laterality Date  . Tonsillectomy and adenoidectomy    . Umbilical hernia repair    . Cataract extraction w/ intraocular lens  implant, bilateral    . Spine surgery      RE; Spinal  stenosis  . Esophagogastroduodenoscopy (egd) with propofol N/A 11/21/2013    Procedure: ESOPHAGOGASTRODUODENOSCOPY (EGD) WITH PROPOFOL;  Surgeon: Cleotis Nipper, MD;  Location: [facility] ENDOSCOPY;  Service: Endoscopy;  Laterality: N/A;  . Colonoscopy with propofol N/A 11/21/2013    Procedure: COLONOSCOPY WITH PROPOFOL;  Surgeon: Cleotis Nipper, MD;  Location: [facility] ENDOSCOPY;  Service: Endoscopy;  Laterality: N/A;     Current Outpatient Prescriptions  Medication Sig Dispense Refill  . acetaminophen (TYLENOL) 325 MG tablet  Take 650 mg by mouth 3 (three) times daily.     Marland Kitchen albuterol (ACCUNEB) 1.25 MG/3ML nebulizer solution Take 1 ampule by nebulization every 6 (six) hours as needed for wheezing.    Marland Kitchen aspirin EC 81 MG tablet Take 81 mg by mouth daily.    Marland Kitchen atorvastatin (LIPITOR) 10 MG tablet Take 10 mg by mouth daily at 6 PM. Take one tablet daily    . calcium carbonate (OS-CAL) 600 MG TABS tablet Take 600 mg by mouth daily with breakfast.    . cholecalciferol (VITAMIN D) 1000 UNITS tablet Take 2,000 Units by mouth daily.    Marland Kitchen diltiazem (CARDIZEM CD) 240 MG 24 hr capsule Take 1 capsule (240 mg total) by mouth daily. 30 capsule 6  . dorzolamide-timolol (COSOPT) 22.3-6.8 MG/ML ophthalmic solution Place 1 drop into both eyes 2 (two) times daily.    . DULoxetine (CYMBALTA) 30 MG capsule Take 30 mg by mouth 2 (two) times daily.    . furosemide (LASIX) 20 MG tablet TAKE ONE TABLET AS NEEDED FOR WEIGHT GAIN OVERNIGHT OF 2 TO 3 LBS DUE TO EDEMA 30 tablet 3  . hydrocortisone 2.5 % cream Apply 1 application topically daily as needed (rash).    Marland Kitchen levothyroxine (SYNTHROID, LEVOTHROID) 75 MCG tablet Take 75 mcg by mouth daily before breakfast.    . LORazepam (ATIVAN) 0.5 MG tablet Take 0.5 tablets by mouth daily as needed. agitation    . memantine (NAMENDA TITRATION PACK) tablet pack Take 14 mg by mouth See admin instructions. 5 mg/day for =1 week; 5 mg twice daily for =1 week; 15 mg/day given in 5 mg and 10 mg separated doses for =1 week; then 10 mg twice daily    . metoprolol (LOPRESSOR) 50 MG tablet Take 50 mg by mouth twice daily if SBP > 100. If SBP is consistently below 100, notify MD @@[facility]  Cardiology for further orders    . mirtazapine (REMERON) 30 MG tablet Take 1 tablet (30 mg total) by mouth at bedtime. 30 tablet 3  . montelukast (SINGULAIR) 10 MG tablet Take 10 mg by mouth at bedtime.    . Multiple Vitamins-Minerals (PRESERVISION/LUTEIN) CAPS Take 2 capsules by mouth daily.    . nitroGLYCERIN (NITROSTAT) 0.4 MG SL  tablet Place 0.4 mg under the tongue every 5 (five) minutes as needed for chest pain (x 3 pills daily).     Marland Kitchen omeprazole (PRILOSEC) 20 MG capsule Take 20 mg by mouth daily.     . ondansetron (ZOFRAN ODT) 4 MG disintegrating tablet Take 1 tablet (4 mg total) by mouth every 8 (eight) hours as needed for nausea or vomiting. 20 tablet 0  . polyethylene glycol (MIRALAX / GLYCOLAX) packet Take 17 g by mouth daily. Mix with 6oz beverage of choice. Hold for loose stool    . QUEtiapine (SEROQUEL) 50 MG tablet Take 50 mg by mouth at bedtime.    . Travoprost, BAK Free, (TRAVATAN Z) 0.004 % SOLN ophthalmic solution Place 1 drop into both eyes daily.  No current facility-administered medications for this visit.    Allergies:   Aricept    Social History:  The patient  reports that she quit smoking about 34 years ago. Her smoking use included Cigarettes. She has never used smokeless tobacco. She reports that she does not drink alcohol or use illicit drugs.   Family History:  The patient's family history includes Heart attack in her father.    ROS:  Please see the history of present illness.  Denies any bleeding, bruising, orthopnea, PND, melena, chest pain. In the past, she has had multiple falls. Otherwise, review of systems are positive for none.   All other systems are reviewed and negative.    PHYSICAL EXAM: VS:  BP 112/68 mmHg  Pulse 76  Ht 4\' 11"  (1.499 m)  Wt 101 lb 12.8 oz (46.176 kg)  BMI 20.55 kg/m2 , BMI Body mass index is 20.55 kg/(m^2). GEN: Elderly, in no acute distress HEENT: normal Neck: no JVD, carotid bruits, or masses Cardiac: Irreg irreg; no murmurs, rubs, or gallops,no edema  Respiratory:  clear to auscultation bilaterally, normal work of breathing GI: soft, nontender, nondistended, + BS MS: no deformity or atrophy Skin: warm and dry, ecchymoses noted Neuro:  Strength and sensation are intact, uses a walker Psych: euthymic mood, memory impairment noted   EKG:  EKG is  ordered today. The ekg ordered today demonstrates on 10/17/14-atrial fibrillation heart rate 88, old septal infarct pattern. No change from prior   Recent Labs: 02/28/2015: TSH 1.47 03/26/2015: ALT 6*; BUN 27*; Creatinine 0.8; Hemoglobin 11.1*; Platelets 284; Potassium 4.4; Sodium 142    Lipid Panel    Component Value Date/Time   CHOL 120 07/26/2014   TRIG 85 07/26/2014   HDL 40 07/26/2014   CHOLHDL 3.5 12/20/2012 0555   VLDL 22 12/20/2012 0555   LDLCALC 63 07/26/2014      Wt Readings from Last 3 Encounters:  07/08/15 101 lb 12.8 oz (46.176 kg)  06/20/15 106 lb (48.081 kg)  04/25/15 99 lb 8 oz (45.133 kg)      Other studies Reviewed: Additional studies/ records that were reviewed today include:  Nuclear stress test 2008-low risk, no ischemia. Normal ejection fraction.  Echocardiogram 12/20/12 - Left ventricle: The cavity size was normal. There was mild concentric hypertrophy. Systolic function was normal. The estimated ejection fraction was in the range of 60% to 65%. Wall motion was normal; there were no regional wall motion abnormalities. The study is not technically sufficient to allow evaluation of LV diastolic function. - Mitral valve: Moderately to severely calcified annulus. Mildly thickened leaflets . Mild to moderate regurgitation.    ASSESSMENT AND PLAN:  1.  Persistent atrial fibrillation-CHADS-VASc 5.  Previously was on Eliquis 2.5 mg twice a day for anticoagulation, age and weight adjusted. She has a history of GI bleed. Worried about falls, quite substantial hematoma development. Worsening memory issues. I agree that after long prior discussion with daughter, [name], we have mutually decided to discontinue her Eliquis. Worried about recurrent stroke risk. This certainly is a possibility and we discussed this. We will place her on aspirin 81 mg. She understands that this does not fully decrease risk of stroke. She is currently maintaining rate  control with diltiazem.  Stable, steady.  2. Chronic anticoagulation-stopped Eliquis. She has had multiple falls, risks now of bleeding outweigh benefits.  3. History of stroke  4. Hyperlipidemia-low-dose atorvastatin, post stroke.   5. Dementia-worsening. Some behavioral disturbance noted.  steady today.   6. Chronic  diastolic heart failure -based upon atrial fibrillation. Previously seen for edema. EF normal. Compression hose, low-dose Lasix can be utilized. Be careful however with hypotension.   Current medicines are reviewed at length with the patient today.  The patient has concerns regarding medicines.  The following changes have been made:   none Labs/ tests ordered today include:   No orders of the defined types were placed in this encounter.     Disposition:   FU with 1year   Signed, Candee Furbish, MD  07/08/2015 9:54 AM    [facility] [address], [place], [place]  [address] Phone: [phone]; Fax: [phone]

## 2015-07-15 IMAGING — CR DG ANKLE COMPLETE 3+V*L*
3 series · 3 of 3 positions shown · non-contrast
Comparison: None

CLINICAL DATA: Fell yesterday, frequent falls, lateral malleolar
swelling and bruising extending to arch, sensitive to touch

EXAM:
LEFT ANKLE COMPLETE - 3+ VIEW

[ankle ap]
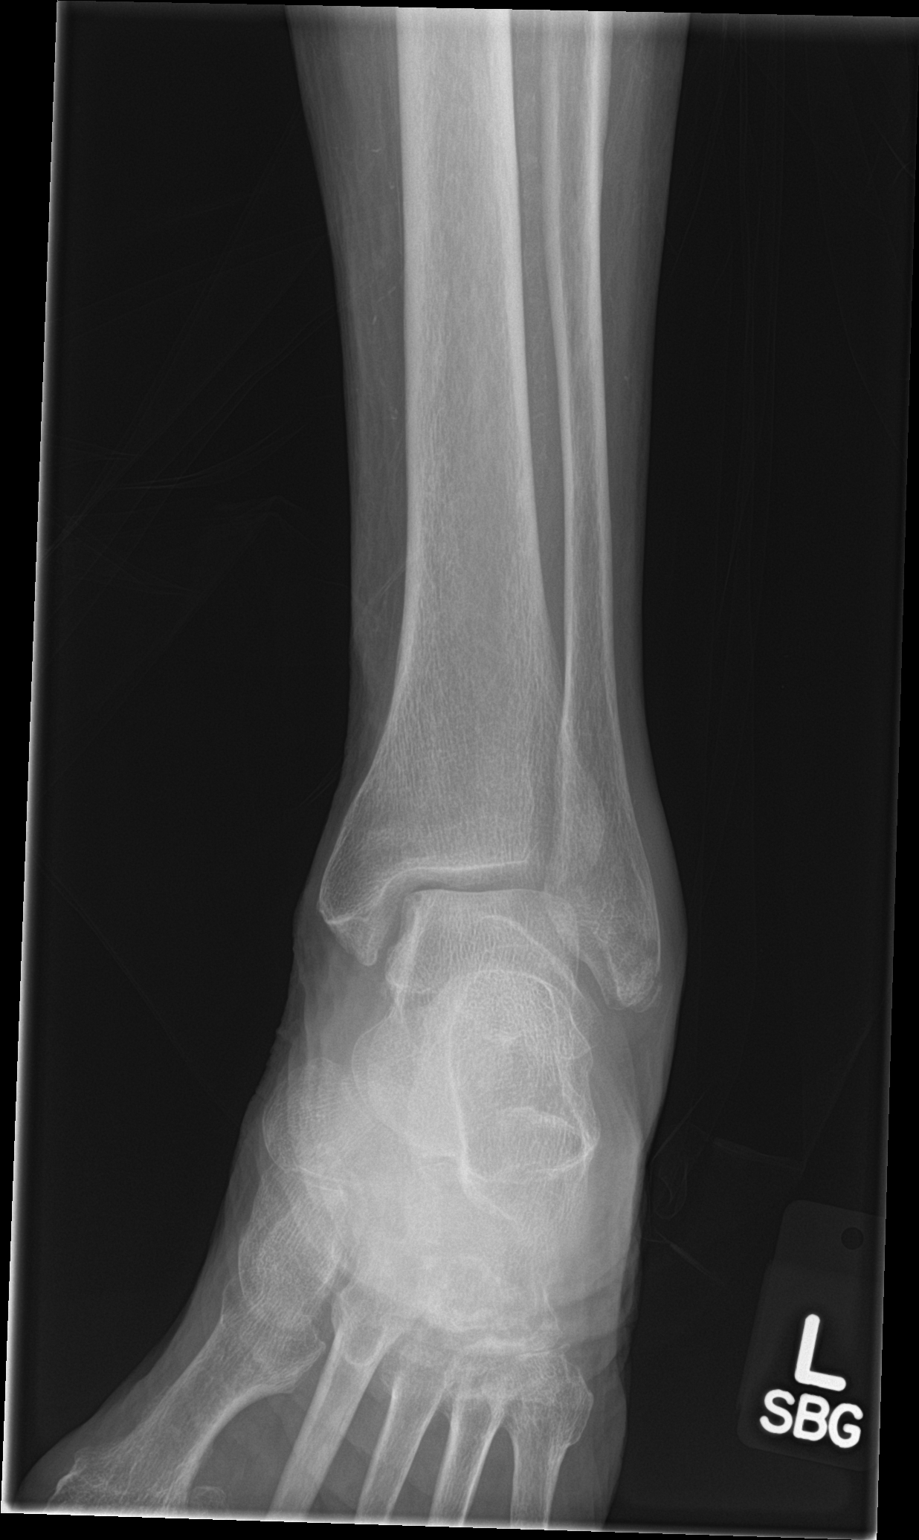

[ankle obl]
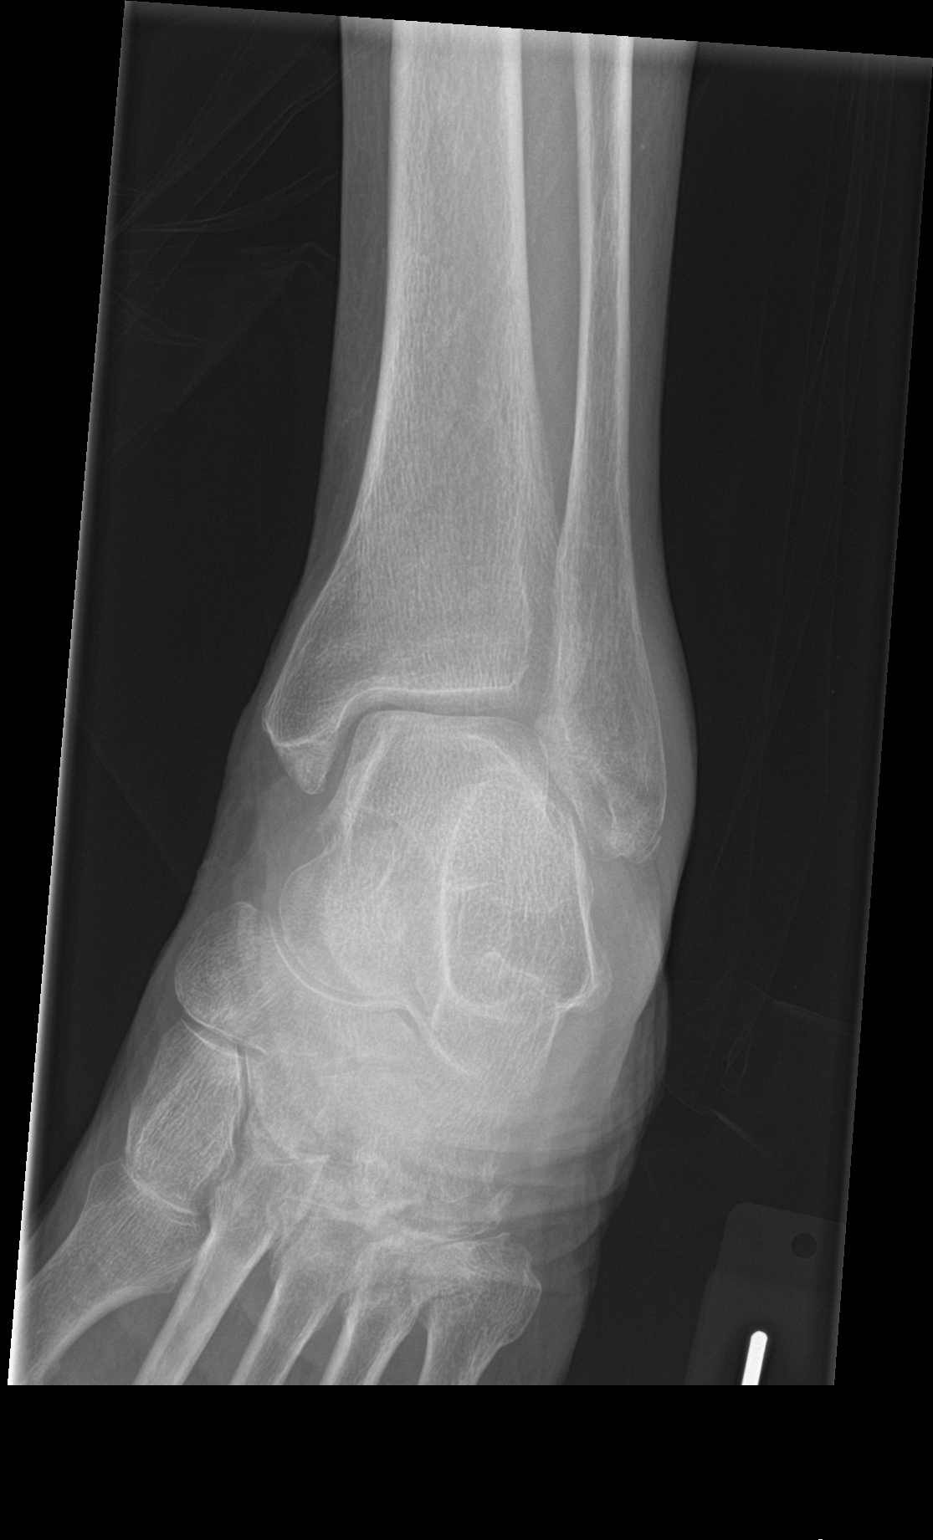

[ankle lat]
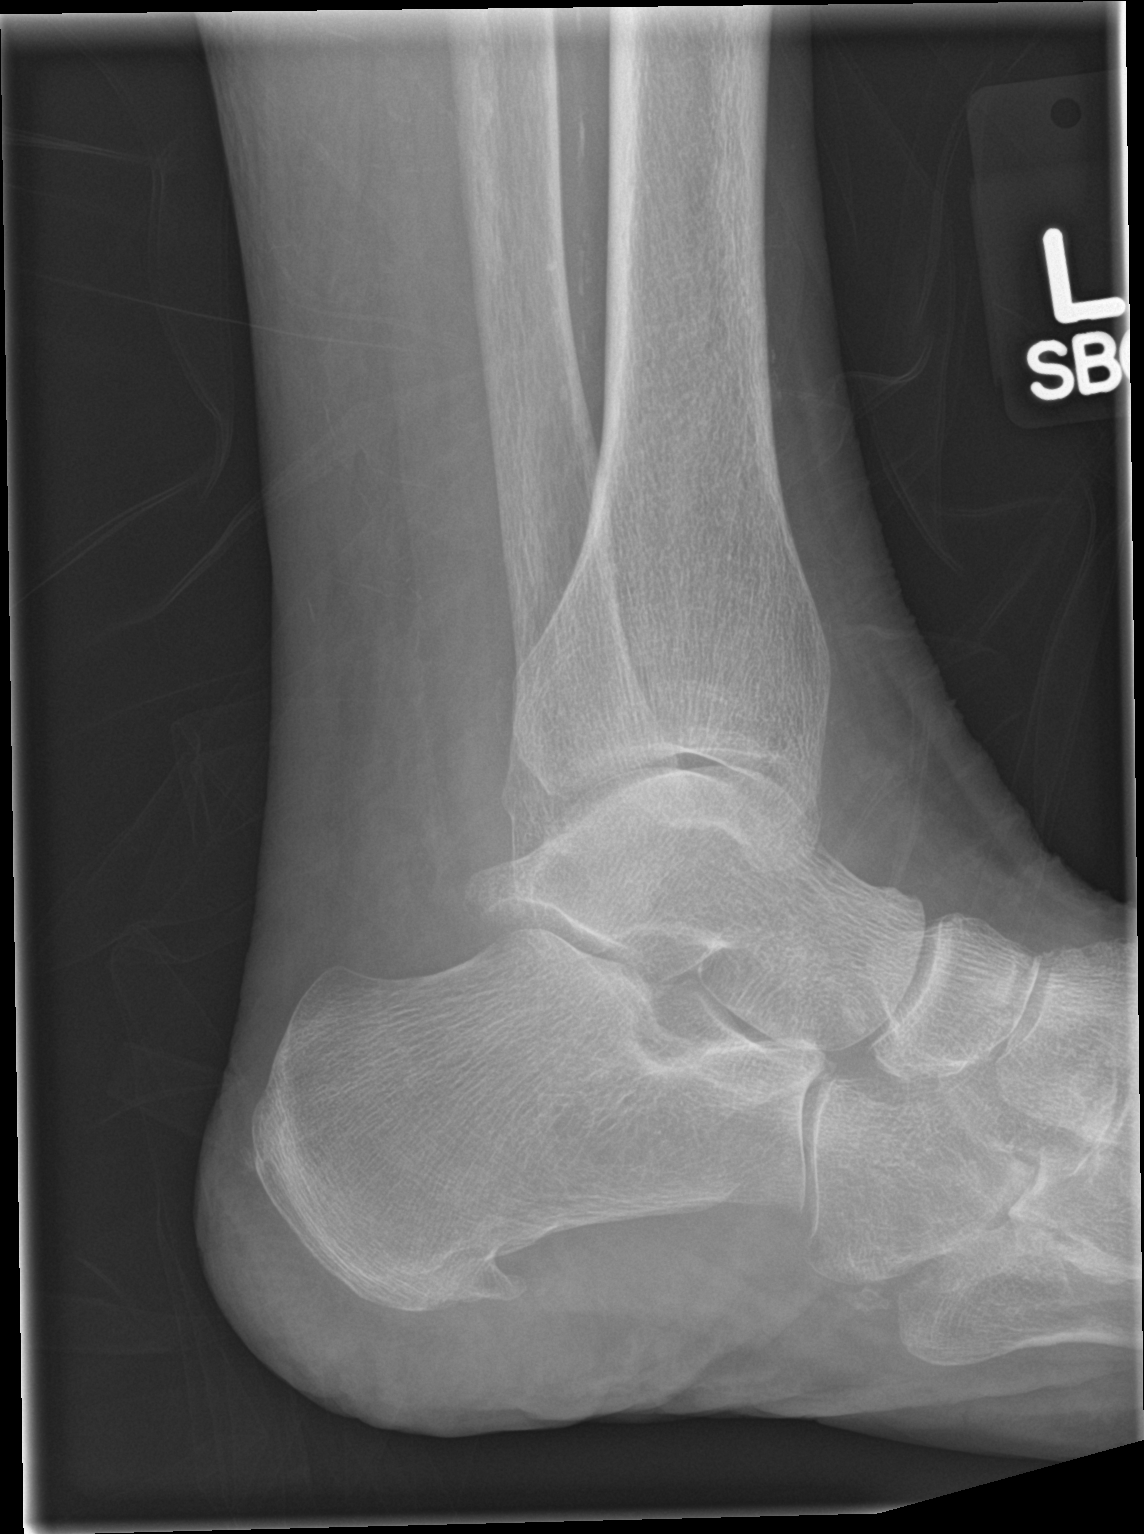

[3 of 3 positions shown; findings below may reference images not displayed]

FINDINGS: Diffuse osseous demineralization.

Minimally displaced fracture at distal aspect of lateral malleolus.

Ankle mortise appears normally aligned.

Overlying lateral soft tissue swelling.

No additional fracture, dislocation, or bone destruction.

Moderate-sized plantar calcaneal spur.
IMPRESSION: Minimally displaced lateral malleolar fracture with overlying soft
tissue swelling.

## 2015-07-15 IMAGING — CR DG FOOT COMPLETE 3+V*L*
3 series · 3 of 3 positions shown · non-contrast
Comparison: Left ankle radiographs of the same day.

CLINICAL DATA: Frequent falls. Fall yesterday. Left foot and ankle
pain.

EXAM:
LEFT FOOT - COMPLETE 3+ VIEW

[foot ap]
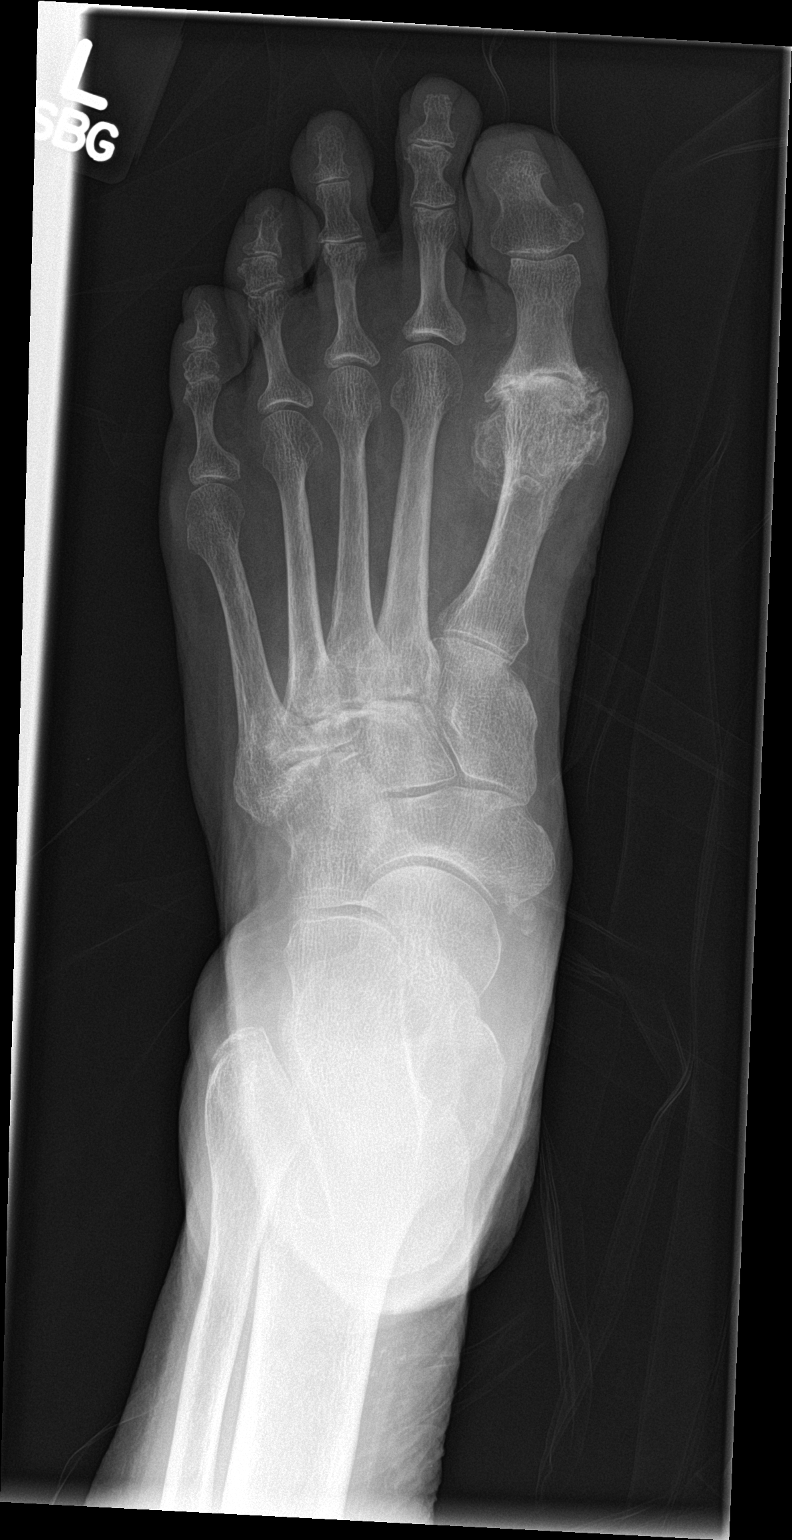

[foot obl]
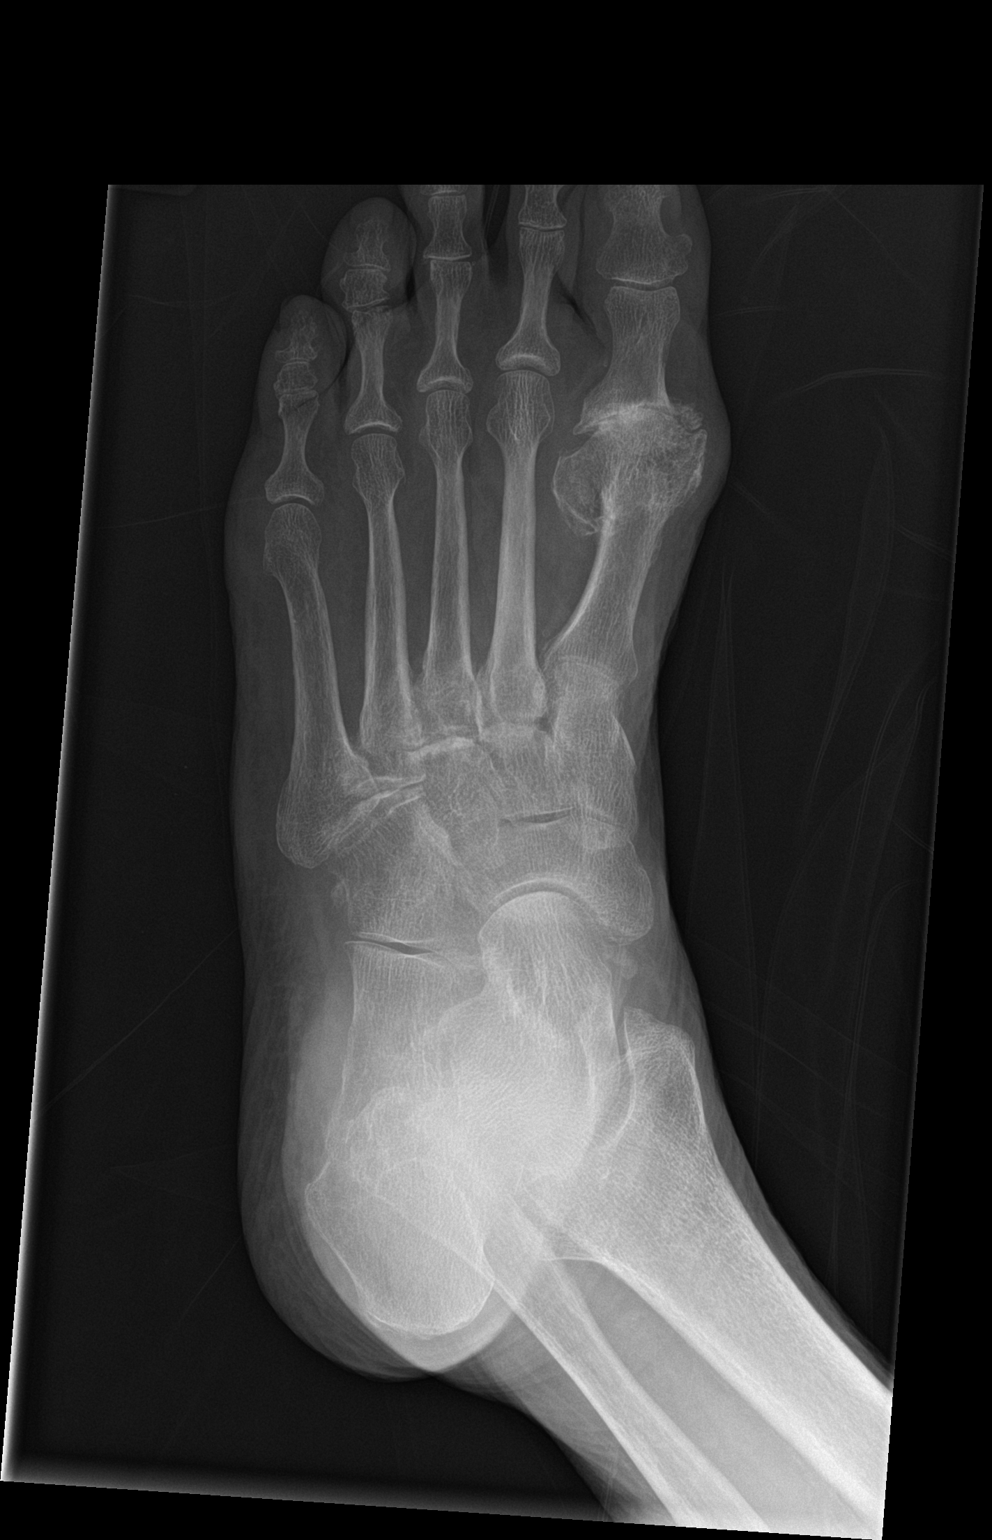

[foot lat]
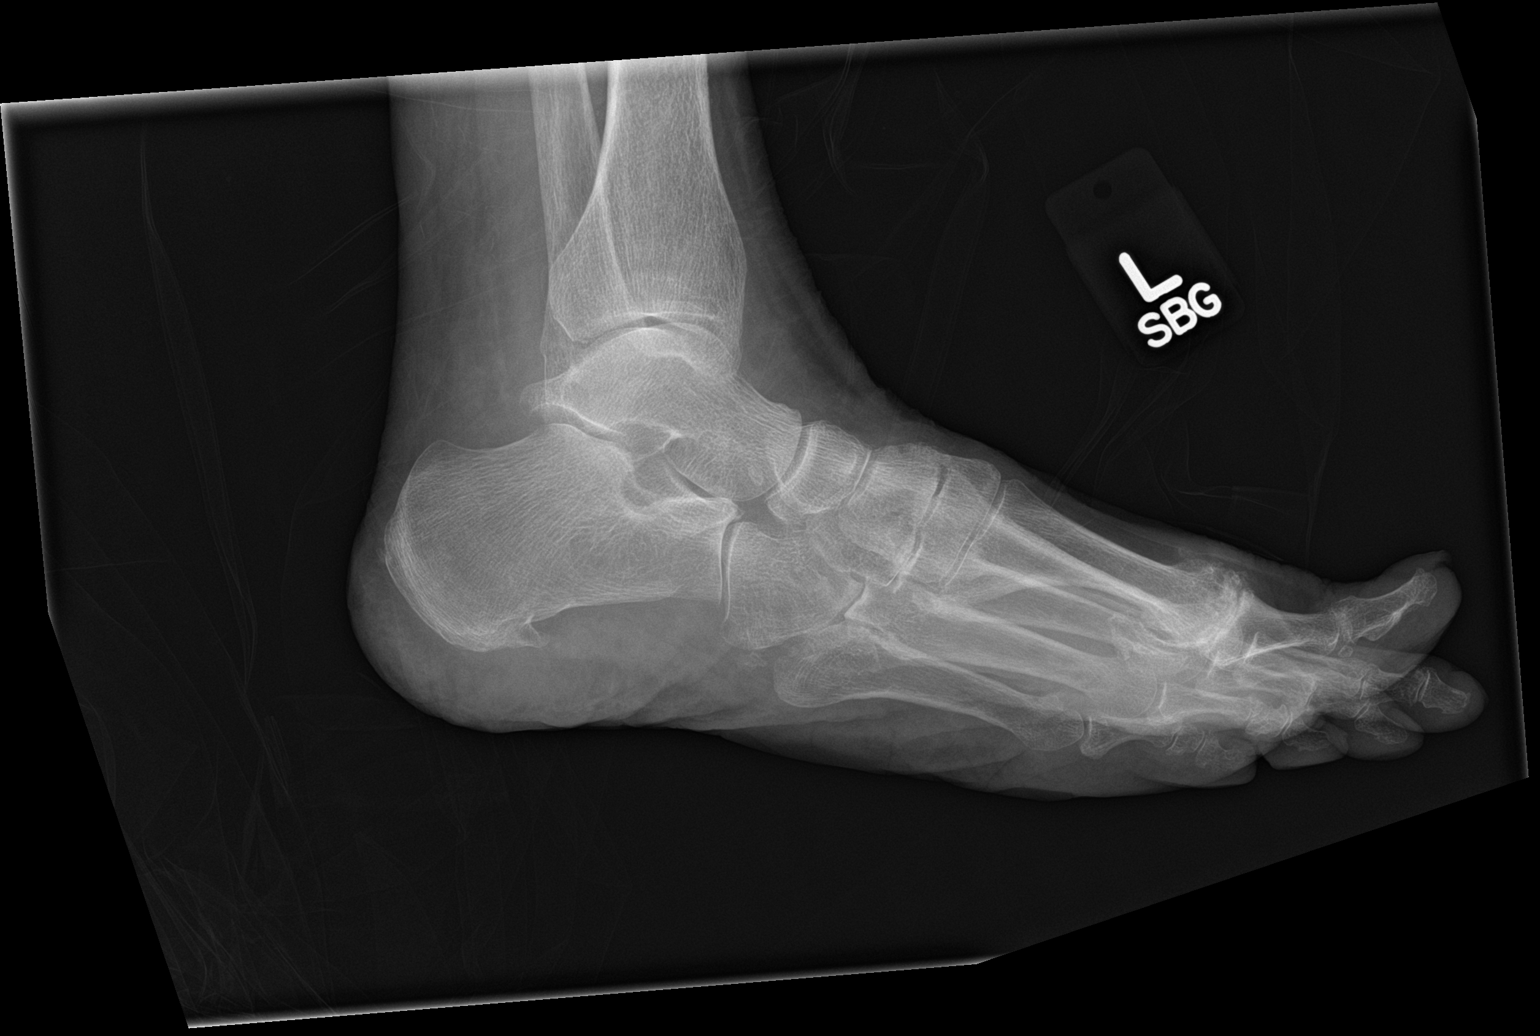

[3 of 3 positions shown; findings below may reference images not displayed]

FINDINGS: A minimally displaced fracture at the lateral malleolus is better
seen on the ankle films. Ossification medial to the navicular bone
likely represents a secondary center of ossification. Advanced
degenerative changes are noted at the first MTP joint. This may be
related to a remote trauma. Mild osteopenia is present. A calcaneal
plantar spur is noted. No other acute fracture is present in the
foot.
IMPRESSION: 1. Acute minimally displaced fracture of the lateral malleolus.
2. Osteopenia.
3. Advanced degenerative changes at the first MTP joint.

## 2015-07-30 ENCOUNTER — Encounter: Payer: Self-pay | Admitting: Internal Medicine

## 2015-07-30 ENCOUNTER — Non-Acute Institutional Stay: Payer: Medicare PPO | Admitting: Internal Medicine

## 2015-07-30 DIAGNOSIS — F0391 Unspecified dementia with behavioral disturbance: Secondary | ICD-10-CM | POA: Diagnosis not present

## 2015-07-30 DIAGNOSIS — E039 Hypothyroidism, unspecified: Secondary | ICD-10-CM | POA: Diagnosis not present

## 2015-07-30 DIAGNOSIS — F03918 Unspecified dementia, unspecified severity, with other behavioral disturbance: Secondary | ICD-10-CM

## 2015-07-30 DIAGNOSIS — I480 Paroxysmal atrial fibrillation: Secondary | ICD-10-CM | POA: Diagnosis not present

## 2015-07-30 DIAGNOSIS — I5032 Chronic diastolic (congestive) heart failure: Secondary | ICD-10-CM | POA: Diagnosis not present

## 2015-07-30 DIAGNOSIS — R296 Repeated falls: Secondary | ICD-10-CM | POA: Diagnosis not present

## 2015-07-30 DIAGNOSIS — F411 Generalized anxiety disorder: Secondary | ICD-10-CM | POA: Diagnosis not present

## 2015-07-30 NOTE — Progress Notes (Signed)
Patient ID: Barbara [name], female   DOB: July 24, 1929, 81 y.o.   MRN: [id number]  Location:  [place] [facility] Provider:  Tyge Somers L. Chrisotpher Rivero, D.O., C.M.D. Hollace Kinnier, DO  Code Status:  DNR Goals of care: Advanced Directive information Does patient have an advance directive?: Yes, Type of Advance Directive: [place];Out of facility DNR (pink MOST or yellow form), Pre-existing out of facility DNR order (yellow form or pink MOST form): Yellow form placed in chart (order not valid for inpatient use)  Chief Complaint  Patient presents with  . Medical Management of Chronic Issues    HPI:  Pt is a 81 y.o. white female seen today for medical management of chronic diseases.  She has advanced dementia with psychosis and delusions, frequent falls, chronic afib, senile osteoporosis, dysphagia, macular degeneration, hypothyroidism, hyperlipidemia, iron deficiency anemia and more.  When seen, she was very calm and pleasant without complaints.  Then I spoke with her daughter and her caregiver who reported she was having terrible delusions this am and could hardly be redirected.  Several mos ago, she'd been seen at [facility] memory disorders clinic at the request of her family and they had proposed several of the approaches we had already tried for her.  She is currently on the depakote, cymbalta, low dose ativan prn, namenda XR at 14mg , remeron 30mg  and seroquel 50mg  at bedtime.  Her periods of delusional behavior and agitation continue as part of her disease.  Unclear how much any of the interventions is helping based on discussion with nursing supervisor.  1:1 seems most helpful.  She has afib, but is off of anticoagulation due to multiple falls including one with a large hematoma of the hip so large that we thought her hip was dislocated.  Review of Systems  Constitutional: Negative for fever and chills.  HENT: Negative for congestion.   Eyes: Positive for blurred vision.        Caregiver reads to her and she seems to enjoy this  Respiratory: Negative for shortness of breath.   Cardiovascular: Negative for chest pain.  Gastrointestinal: Negative for abdominal pain.  Genitourinary: Negative for dysuria.  Musculoskeletal: Positive for falls.  Skin: Negative for rash.  Neurological: Negative for dizziness.  Endo/Heme/Allergies: Does not bruise/bleed easily.  Psychiatric/Behavioral: Positive for memory loss. The patient has insomnia.     Past Medical History  Diagnosis Date  . Dysphagia     pharyngoesophageal phase  . Diarrhea   . Chronic atrial fibrillation ([place])   . Cervicalgia   . Low back pain   . Hemorrhage of anus and rectum   . Iron deficiency anemia due to chronic blood loss   . Anemia   . Cerebral infarction due to embolism of precerebral artery ([place])   . Gait abnormality   . Glaucoma, open angle   . Hypertension   . Long term current use of anticoagulant   . Anxiety disorder   . Mixed hyperlipidemia   . Hypothyroidism   . Dementia   . Macular degeneration   . Age related osteoporosis   . Atrial fibrillation ([place]) 2009    Long term anticoagulation. Warfarin changed to Xarelto 06/2012  . Cystocele, midline   . Senile dementia, uncomplicated   . Generalized anxiety disorder   . Unspecified glaucoma   . Macular degeneration (senile) of retina, unspecified   . Osteoarthrosis, unspecified whether generalized or localized, unspecified site   . Senile osteoporosis   . Pneumonia, organism unspecified 03/2012  Tx w/ PO antibx  . Unspecified vitamin D deficiency   . Unspecified venous (peripheral) insufficiency   . Tear film insufficiency, unspecified 08/2012  . Debility, unspecified 04/01/2012  . Long term (current) use of anticoagulants 04/01/2012  . CVA (cerebral infarction) 11/19/2012  . Anemia 11/2012    transfusion 11/2012  . Unspecified hypothyroidism   . Other and unspecified hyperlipidemia     Atorvastatin started 12/2012  . Benign  paroxysmal positional vertigo 11/08/2013  . Glaucoma   . Stroke ([place]) 2014  . Contusion, chest wall 11/27/2013    Left side.   Marland Kitchen History of fall 11/27/2013  . Wound of right leg 11/13/2013    posteriorly    Past Surgical History  Procedure Laterality Date  . Tonsillectomy and adenoidectomy    . Umbilical hernia repair    . Cataract extraction w/ intraocular lens  implant, bilateral    . Spine surgery      RE; Spinal stenosis  . Esophagogastroduodenoscopy (egd) with propofol N/A 11/21/2013    Procedure: ESOPHAGOGASTRODUODENOSCOPY (EGD) WITH PROPOFOL;  Surgeon: Cleotis Nipper, MD;  Location: [facility] ENDOSCOPY;  Service: Endoscopy;  Laterality: N/A;  . Colonoscopy with propofol N/A 11/21/2013    Procedure: COLONOSCOPY WITH PROPOFOL;  Surgeon: Cleotis Nipper, MD;  Location: [facility] ENDOSCOPY;  Service: Endoscopy;  Laterality: N/A;    Allergies  Allergen Reactions  . Aricept [Donepezil Hcl] Other (See Comments)    Muscle pain      Medication List       This list is accurate as of: 07/30/15 11:59 PM.  Always use your most recent med list.               acetaminophen 325 MG tablet  Commonly known as:  TYLENOL  Take 650 mg by mouth 3 (three) times daily.     albuterol 1.25 MG/3ML nebulizer solution  Commonly known as:  ACCUNEB  Take 1 ampule by nebulization every 6 (six) hours as needed for wheezing.     aspirin EC 81 MG tablet  Take 81 mg by mouth daily.     atorvastatin 10 MG tablet  Commonly known as:  LIPITOR  Take 10 mg by mouth daily at 6 PM. Take one tablet daily     cholecalciferol 1000 units tablet  Commonly known as:  VITAMIN D  Take 2,000 Units by mouth daily.     diltiazem 240 MG 24 hr capsule  Commonly known as:  CARDIZEM CD  Take 1 capsule (240 mg total) by mouth daily.     divalproex 250 MG DR tablet  Commonly known as:  DEPAKOTE  Take 250 mg by mouth. Take one tablet twice a day     dorzolamide-timolol 22.3-6.8 MG/ML ophthalmic solution  Commonly known as:   COSOPT  Place 1 drop into both eyes 2 (two) times daily.     DULoxetine 30 MG capsule  Commonly known as:  CYMBALTA  Take 30 mg by mouth 2 (two) times daily.     furosemide 20 MG tablet  Commonly known as:  LASIX  TAKE ONE TABLET AS NEEDED FOR WEIGHT GAIN OVERNIGHT OF 2 TO 3 LBS DUE TO EDEMA     hydrocortisone 2.5 % cream  Apply 1 application topically daily as needed (rash).     lactose free nutrition Liqd  Take 237 mLs by mouth. Twice daily split into  Oz servings     levothyroxine 75 MCG tablet  Commonly known as:  SYNTHROID, LEVOTHROID  Take 75  mcg by mouth daily before breakfast.     LORazepam 0.5 MG tablet  Commonly known as:  ATIVAN  Take 0.5 tablets by mouth daily as needed. agitation     metoprolol 50 MG tablet  Commonly known as:  LOPRESSOR  Take 50 mg by mouth twice daily if SBP > 100. If SBP is consistently below 100, notify MD @@[facility]  Cardiology for further orders     mirtazapine 30 MG tablet  Commonly known as:  REMERON  Take 1 tablet (30 mg total) by mouth at bedtime.     montelukast 10 MG tablet  Commonly known as:  SINGULAIR  Take 10 mg by mouth at bedtime.     NAMENDA XR 14 MG Cp24 24 hr capsule  Generic drug:  memantine  Take 14 mg by mouth. Take one daily for memory     nitroGLYCERIN 0.4 MG SL tablet  Commonly known as:  NITROSTAT  Place 0.4 mg under the tongue every 5 (five) minutes as needed for chest pain (x 3 pills daily).     omeprazole 20 MG capsule  Commonly known as:  PRILOSEC  Take 20 mg by mouth daily.     ondansetron 4 MG disintegrating tablet  Commonly known as:  ZOFRAN ODT  Take 1 tablet (4 mg total) by mouth every 8 (eight) hours as needed for nausea or vomiting.     polyethylene glycol packet  Commonly known as:  MIRALAX / GLYCOLAX  Take 17 g by mouth daily. Mix with 6oz beverage of choice. Hold for loose stool     PRESERVISION/LUTEIN Caps  Take 2 capsules by mouth daily.     QUEtiapine 50 MG tablet  Commonly known as:   SEROQUEL  Take 50 mg by mouth at bedtime. Take 1/2 tab qhs, hold for excess sedation     TRAVATAN Z 0.004 % Soln ophthalmic solution  Generic drug:  Travoprost (BAK Free)  Place 1 drop into both eyes daily.        Immunization History  Administered Date(s) Administered  . Influenza Whole 04/29/2012  . Influenza-Unspecified 05/24/2013, 05/15/2014, 05/23/2015  . Pneumococcal Polysaccharide-23 01/31/2009  . Td 11/29/2001  . Tdap 07/19/2011  . Zoster 01/10/2002, 01/09/2006   Pertinent  Health Maintenance Due  Topic Date Due  . DEXA SCAN  05/27/1996  . PNA vac Low Risk Adult (2 of 2 - PCV13) 01/31/2010  . INFLUENZA VACCINE  03/10/2016   Fall Risk  08/13/2015  Falls in the past year? Yes  Number falls in past yr: 2 or more  Injury with Fall? Yes  Risk Factor Category  High Fall Risk  Risk for fall due to : History of fall(s)  Follow up Falls evaluation completed    Filed Vitals:   07/30/15 1526  BP: 105/80  Pulse: 72  Temp: 96.7 F (35.9 C)  Resp: 18  Height: 4\' 11"  (1.499 m)  Weight: 105 lb (47.628 kg)  SpO2: 97%   Body mass index is 21.2 kg/(m^2). Physical Exam  Constitutional: No distress.  HENT:  Head: Normocephalic and atraumatic.  Cardiovascular: Intact distal pulses.   irreg irreg  Pulmonary/Chest: Effort normal and breath sounds normal. No respiratory distress.  Abdominal: Soft. Bowel sounds are normal.  Musculoskeletal: Normal range of motion.  Kyphotic posture, walks unsteadily, needs support of another or walker  Neurological: She is alert.  Skin: Skin is warm and dry.    Labs reviewed:  Recent Labs  01/03/15 2017 02/25/15 03/26/15 08/09/15  NA 141 141  142 143  K 3.9 4.1 4.4 3.8  CL 106  --   --   --   CO2 22  --   --   --   GLUCOSE 118*  --   --   --   BUN 25* 23* 27* 23*  CREATININE 0.83 1.0 0.8 0.8  CALCIUM 9.2  --   --   --     Recent Labs  01/03/15 2017 02/25/15 03/26/15  AST 37 26 19  ALT 12* 15 6*  ALKPHOS 118 120 98  BILITOT  0.8  --   --   PROT 7.8  --   --   ALBUMIN 4.1  --   --     Recent Labs  01/03/15 2017 02/25/15 03/26/15 08/09/15  WBC 14.9* 7.7 7.0 7.8  NEUTROABS 12.6*  --   --   --   HGB 13.6 11.7* 11.1* 11.3*  HCT 42.4 36 34* 34*  MCV 102.4*  --   --   --   PLT 293 304 284 234   Lab Results  Component Value Date   TSH 1.47 02/28/2015   Lab Results  Component Value Date   HGBA1C 5.5 12/20/2012   Lab Results  Component Value Date   CHOL 120 07/26/2014   HDL 40 07/26/2014   LDLCALC 63 07/26/2014   TRIG 85 07/26/2014   CHOLHDL 3.5 12/20/2012   Assessment/Plan 1. Dementia with behavioral disturbance -continue AL care in memory care unit with one on one caregiver -cont psych meds as per multiple individuals' input, but ? Benefit at this point -check depakote level  2. Hypothyroidism, unspecified hypothyroidism type -cont synthroid 26mcg daily before breakfast on empty stomach separate from other meds  -tsh normal in July, should be rechecked in new year  3. Generalized anxiety disorder -if she does have this, she's on ativan 0.5mg  as needed, but suspect her behaviors are all dementia-related  4. Paroxysmal atrial fibrillation (HCC) -off anticoagulation due to falls and hematomas, too high risk for intracranial hemorrhage and death  5. Chronic diastolic congestive heart failure (HCC) -cont lasix prn for weight gain, lopressor and monitor for volume overload  6. Falls frequently -ongoing, multifactorial--dementia progression, meds, arthritic joints  Family/ staff Communication: spoke with daughter, caregiver, nursing staff  Labs/tests ordered:  Cbc, cmp, flp, depakote level  Barbara [name], D.O. [facility] [address], [place] [address] Cell Phone (Mon-Fri 8am-5pm):  [phone] On Call:  [phone] & follow prompts after 5pm & weekends Office Phone:  [phone] Office Fax:  [phone]

## 2015-08-08 ENCOUNTER — Non-Acute Institutional Stay: Payer: Medicare PPO | Admitting: Adult Health

## 2015-08-08 DIAGNOSIS — F05 Delirium due to known physiological condition: Secondary | ICD-10-CM | POA: Diagnosis not present

## 2015-08-08 DIAGNOSIS — R531 Weakness: Secondary | ICD-10-CM | POA: Diagnosis not present

## 2015-08-09 LAB — CBC AND DIFFERENTIAL
HCT: 34 % — AB (ref 36–46)
HEMOGLOBIN: 11.3 g/dL — AB (ref 12.0–16.0)
PLATELETS: 234 10*3/uL (ref 150–399)
WBC: 7.8 10*3/mL

## 2015-08-09 LAB — BASIC METABOLIC PANEL
BUN: 23 mg/dL — AB (ref 4–21)
Creatinine: 0.8 mg/dL (ref 0.5–1.1)
GLUCOSE: 86 mg/dL
Potassium: 3.8 mmol/L (ref 3.4–5.3)
SODIUM: 143 mmol/L (ref 137–147)

## 2015-08-13 ENCOUNTER — Encounter: Payer: Self-pay | Admitting: Adult Health

## 2015-08-13 NOTE — Progress Notes (Signed)
Patient ID: Barbara [name], female   DOB: [birth year], 82 y.o.   MRN: [id number]     Nursing Home Location:  [place]   Code Status: DNR  Patient Care Team: Gayland Curry, DO as PCP - General (Geriatric Medicine) Well [place], DO (Geriatric Medicine) Royal Hawthorn, NP as Nurse Practitioner (Nurse Practitioner)   Place of Service: ALF [phone])  Chief Complaint  Patient presents with  . Acute Visit    weak, more confused    HPI:  82 y.o.female  residing at [facility], memory care section. She has a hx of mixed with behavioral disturbance, afib, frequent falls, CHF, OP, CVA, OA, anemia, and dysphagia. I was asked to see her today for complaints of weakness, confusion, and DOE. Resident has dementia and can not contribute to the hx but can answer q's.  She denies any SOB, CP, cough, runny nose, diarrhea, n, v, d.  She had a GI illness on 12/25 with nausea, vomiting, and diarrhea that lasted 24 hrs. Since then she has had a decreased appetite, increased confusion, and is now using a wheelchair rather than a walker due to weakness. Her caregiver noticed that when she ambulated today for short distances she became short of breath.  She has not had a fever or purulent sputum. She has a hx of CHF but her weight and edema are unchanged. Care giver also reports that resident has hoarseness today.  Has dysphagia and on NTL. Family has signed a waiver to allow for water between meals.  Review of Systems:  Review of Systems  Constitutional: Positive for activity change, appetite change and fatigue. Negative for fever, chills, diaphoresis and unexpected weight change.  HENT: Negative for congestion and rhinorrhea.   Respiratory: Positive for shortness of breath. Negative for cough, wheezing and stridor.   Cardiovascular: Negative for chest pain, palpitations and leg swelling.  Gastrointestinal: Negative for nausea,  diarrhea, constipation and abdominal distention.  Genitourinary: Negative for dysuria and difficulty urinating.  Musculoskeletal: Positive for arthralgias and gait problem. Negative for back pain.  Neurological: Negative for dizziness, facial asymmetry and speech difficulty.  Psychiatric/Behavioral: Positive for behavioral problems, confusion and agitation.    Medications: Patient's Medications  New Prescriptions   No medications on file  Previous Medications   ACETAMINOPHEN (TYLENOL) 325 MG TABLET    Take 650 mg by mouth 3 (three) times daily.    ALBUTEROL (ACCUNEB) 1.25 MG/3ML NEBULIZER SOLUTION    Take 1 ampule by nebulization every 6 (six) hours as needed for wheezing.   ASPIRIN EC 81 MG TABLET    Take 81 mg by mouth daily.   ATORVASTATIN (LIPITOR) 10 MG TABLET    Take 10 mg by mouth daily at 6 PM. Take one tablet daily   CHOLECALCIFEROL (VITAMIN D) 1000 UNITS TABLET    Take 2,000 Units by mouth daily.   DILTIAZEM (CARDIZEM CD) 240 MG 24 HR CAPSULE    Take 1 capsule (240 mg total) by mouth daily.   DIVALPROEX (DEPAKOTE) 250 MG DR TABLET    Take 250 mg by mouth. Take one tablet twice a day   DORZOLAMIDE-TIMOLOL (COSOPT) 22.3-6.8 MG/ML OPHTHALMIC SOLUTION    Place 1 drop into both eyes 2 (two) times daily.   DULOXETINE (CYMBALTA) 30 MG CAPSULE    Take 30 mg by mouth 2 (two) times daily.   FUROSEMIDE (LASIX) 20 MG TABLET    TAKE ONE TABLET AS NEEDED FOR WEIGHT GAIN OVERNIGHT OF  2 TO 3 LBS DUE TO EDEMA   HYDROCORTISONE 2.5 % CREAM    Apply 1 application topically daily as needed (rash).   LACTOSE FREE NUTRITION (BOOST) LIQD    Take 237 mLs by mouth. Twice daily split into  Oz servings   LEVOTHYROXINE (SYNTHROID, LEVOTHROID) 75 MCG TABLET    Take 75 mcg by mouth daily before breakfast.   LORAZEPAM (ATIVAN) 0.5 MG TABLET    Take 0.5 tablets by mouth daily as needed. agitation   MEMANTINE (NAMENDA XR) 14 MG CP24 24 HR CAPSULE    Take 14 mg by mouth. Take one daily for memory   METOPROLOL  (LOPRESSOR) 50 MG TABLET    Take 50 mg by mouth twice daily if SBP > 100. If SBP is consistently below 100, notify MD @@[facility]  Cardiology for further orders   MIRTAZAPINE (REMERON) 30 MG TABLET    Take 1 tablet (30 mg total) by mouth at bedtime.   MONTELUKAST (SINGULAIR) 10 MG TABLET    Take 10 mg by mouth at bedtime.   MULTIPLE VITAMINS-MINERALS (PRESERVISION/LUTEIN) CAPS    Take 2 capsules by mouth daily.   NITROGLYCERIN (NITROSTAT) 0.4 MG SL TABLET    Place 0.4 mg under the tongue every 5 (five) minutes as needed for chest pain (x 3 pills daily).    OMEPRAZOLE (PRILOSEC) 20 MG CAPSULE    Take 20 mg by mouth daily.    ONDANSETRON (ZOFRAN ODT) 4 MG DISINTEGRATING TABLET    Take 1 tablet (4 mg total) by mouth every 8 (eight) hours as needed for nausea or vomiting.   POLYETHYLENE GLYCOL (MIRALAX / GLYCOLAX) PACKET    Take 17 g by mouth daily. Mix with 6oz beverage of choice. Hold for loose stool   QUETIAPINE (SEROQUEL) 50 MG TABLET    Take 50 mg by mouth at bedtime.   TRAVOPROST, BAK FREE, (TRAVATAN Z) 0.004 % SOLN OPHTHALMIC SOLUTION    Place 1 drop into both eyes daily.   Modified Medications   No medications on file  Discontinued Medications   No medications on file     Physical Exam:  Filed Vitals:   08/13/15 0846  BP: 108/63  Pulse: 84  Temp: 98.5 F (36.9 C)  Resp: 18  Weight: 105 lb (47.628 kg)  SpO2: 95%    Physical Exam  Constitutional: No distress.  Eyes: Conjunctivae and EOM are normal. Pupils are equal, round, and reactive to light. Right eye exhibits no discharge. Left eye exhibits no discharge.  Neck: No JVD present.  Cardiovascular: Normal rate.   No murmur heard. Irregular, bilat trace edema  Pulmonary/Chest: Effort normal. No respiratory distress. She has no wheezes. She has rales (bilateral, right >left).  Abdominal: Soft. Bowel sounds are normal. She exhibits no distension. There is no tenderness.  Musculoskeletal:  Generally weak, MAE  Neurological: She is  alert.  Oriented to self only, able to f/c  Skin: Skin is warm and dry. She is not diaphoretic.  Psychiatric: Affect normal.    Wt Readings from Last 3 Encounters:  08/13/15 105 lb (47.628 kg)  07/30/15 105 lb (47.628 kg)  07/08/15 101 lb 12.8 oz (46.176 kg)     Labs reviewed/Significant Diagnostic Results:  Basic Metabolic Panel:  Recent Labs  01/03/15 2017 02/25/15 03/26/15  NA 141 141 142  K 3.9 4.1 4.4  CL 106  --   --   CO2 22  --   --   GLUCOSE 118*  --   --  BUN 25* 23* 27*  CREATININE 0.83 1.0 0.8  CALCIUM 9.2  --   --    Liver Function Tests:  Recent Labs  01/03/15 2017 02/25/15 03/26/15  AST 37 26 19  ALT 12* 15 6*  ALKPHOS 118 120 98  BILITOT 0.8  --   --   PROT 7.8  --   --   ALBUMIN 4.1  --   --     Recent Labs  01/03/15 2017  LIPASE 39   No results for input(s): AMMONIA in the last 8760 hours. CBC:  Recent Labs  01/03/15 2017 02/25/15 03/26/15  WBC 14.9* 7.7 7.0  NEUTROABS 12.6*  --   --   HGB 13.6 11.7* 11.1*  HCT 42.4 36 34*  MCV 102.4*  --   --   PLT 293 304 284   CBG: No results for input(s): GLUCAP in the last 8760 hours. TSH:  Recent Labs  08/21/14 10/06/14 02/28/15  TSH 0.04* 0.24* 1.47   A1C: Lab Results  Component Value Date   HGBA1C 5.5 12/20/2012   Lipid Panel: No results for input(s): CHOL, HDL, LDLCALC, TRIG, CHOLHDL, LDLDIRECT in the last 8760 hours.     Assessment/Plan  1) Confusion -?dehydration due to GI illness vs pneumonia with crackles noted on exam -does not appear to be in CHF based on exam -?aspiration due to vomiting and hx of dysphagia -check CXR -BMP, CBC also  2) Weakness -due to underlying illness -no focal deficit noted on exam -correct #1 and if she persists to need to the [facility] would involve therapy   Cindi Carbon, [place] [phone]

## 2015-08-15 ENCOUNTER — Non-Acute Institutional Stay: Payer: Medicare PPO | Admitting: Adult Health

## 2015-08-15 DIAGNOSIS — J690 Pneumonitis due to inhalation of food and vomit: Secondary | ICD-10-CM

## 2015-08-15 DIAGNOSIS — F0391 Unspecified dementia with behavioral disturbance: Secondary | ICD-10-CM

## 2015-08-15 DIAGNOSIS — R531 Weakness: Secondary | ICD-10-CM | POA: Diagnosis not present

## 2015-08-15 DIAGNOSIS — F03918 Unspecified dementia, unspecified severity, with other behavioral disturbance: Secondary | ICD-10-CM

## 2015-08-15 NOTE — Progress Notes (Signed)
Patient ID: Barbara [name], female   DOB: Feb 16, 1929, 82 y.o.   MRN: [id number]     Nursing Home Location:  [place]   Code Status: DNR  Patient Care Team: Gayland Curry, DO as PCP - General (Geriatric Medicine) Well Mathews, DO (Geriatric Medicine) Royal Hawthorn, NP as Nurse Practitioner (Nurse Practitioner)   Place of Service: ALF [phone])  Chief Complaint  Patient presents with  . Acute Visit    f/u pna    HPI:  82 y.o.female  residing at [facility], memory care section. She has a hx of mixed with behavioral disturbance, afib, frequent falls, CHF, OP, CVA, OA, anemia, and dysphagia. I was asked to see her today for lethargy.  Resident can not contribute to hx due to dementia. Caregiver reports more weakness over time, using walker less often and depending on the wheelchair. Resident was treated for a right lower lobe infiltrate on 12/30 with 7 days of avelox. No fever, cough, sob, or purulent sputum a present. On 12/25 resident had a gastroenteritis as well. BMP and CBC on 12/29 was unremarkable. Resident has had intermittent decreased appetite.  VS WNL except BP soft at times, in the 0000000 systolic.  Review of Systems:  Review of Systems  Constitutional: Positive for malaise/fatigue. Negative for fever, chills, weight loss and diaphoresis.  HENT: Negative for congestion.   Respiratory: Negative for cough, sputum production, shortness of breath and wheezing.   Cardiovascular: Negative for chest pain, leg swelling and PND.  Genitourinary: Negative for dysuria.  Musculoskeletal: Positive for joint pain and falls. Negative for myalgias, back pain and neck pain.  Skin: Negative for rash.  Neurological: Positive for weakness. Negative for dizziness and focal weakness.  Psychiatric/Behavioral: Positive for memory loss. Negative for depression. The patient is not nervous/anxious.         Medications: Patient's Medications  New Prescriptions   No medications on file  Previous Medications   ACETAMINOPHEN (TYLENOL) 325 MG TABLET    Take 650 mg by mouth 3 (three) times daily.    ALBUTEROL (ACCUNEB) 1.25 MG/3ML NEBULIZER SOLUTION    Take 1 ampule by nebulization every 6 (six) hours as needed for wheezing.   ASPIRIN EC 81 MG TABLET    Take 81 mg by mouth daily.   ATORVASTATIN (LIPITOR) 10 MG TABLET    Take 10 mg by mouth daily at 6 PM. Take one tablet daily   CHOLECALCIFEROL (VITAMIN D) 1000 UNITS TABLET    Take 2,000 Units by mouth daily.   DILTIAZEM (CARDIZEM CD) 240 MG 24 HR CAPSULE    Take 1 capsule (240 mg total) by mouth daily.   DIVALPROEX (DEPAKOTE) 125 MG DR TABLET    Take 125 mg by mouth at bedtime. Hold for excess sedation   DORZOLAMIDE-TIMOLOL (COSOPT) 22.3-6.8 MG/ML OPHTHALMIC SOLUTION    Place 1 drop into both eyes 2 (two) times daily.   DULOXETINE (CYMBALTA) 30 MG CAPSULE    Take 30 mg by mouth 2 (two) times daily.   FUROSEMIDE (LASIX) 20 MG TABLET    TAKE ONE TABLET AS NEEDED FOR WEIGHT GAIN OVERNIGHT OF 2 TO 3 LBS DUE TO EDEMA   HYDROCORTISONE 2.5 % CREAM    Apply 1 application topically daily as needed (rash).   LACTOSE FREE NUTRITION (BOOST) LIQD    Take 237 mLs by mouth. Twice daily split into  Oz servings   LEVOTHYROXINE (SYNTHROID, LEVOTHROID) 75 MCG TABLET    Take  75 mcg by mouth daily before breakfast.   LORAZEPAM (ATIVAN) 0.5 MG TABLET    Take 0.5 tablets by mouth daily as needed. agitation   MEMANTINE (NAMENDA XR) 14 MG CP24 24 HR CAPSULE    Take 14 mg by mouth. Take one daily for memory   METOPROLOL (LOPRESSOR) 50 MG TABLET    Take 50 mg by mouth twice daily if SBP > 100. If SBP is consistently below 100, notify MD @@[facility]  Cardiology for further orders   MIRTAZAPINE (REMERON) 30 MG TABLET    Take 1 tablet (30 mg total) by mouth at bedtime.   MONTELUKAST (SINGULAIR) 10 MG TABLET    Take 10 mg by mouth at bedtime.   MULTIPLE VITAMINS-MINERALS  (PRESERVISION/LUTEIN) CAPS    Take 2 capsules by mouth daily.   NITROGLYCERIN (NITROSTAT) 0.4 MG SL TABLET    Place 0.4 mg under the tongue every 5 (five) minutes as needed for chest pain (x 3 pills daily).    OMEPRAZOLE (PRILOSEC) 20 MG CAPSULE    Take 20 mg by mouth daily.    ONDANSETRON (ZOFRAN ODT) 4 MG DISINTEGRATING TABLET    Take 1 tablet (4 mg total) by mouth every 8 (eight) hours as needed for nausea or vomiting.   POLYETHYLENE GLYCOL (MIRALAX / GLYCOLAX) PACKET    Take 17 g by mouth daily. Mix with 6oz beverage of choice. Hold for loose stool   QUETIAPINE (SEROQUEL) 50 MG TABLET    Take 50 mg by mouth at bedtime. Take 1/2 tab qhs, hold for excess sedation   TRAVOPROST, BAK FREE, (TRAVATAN Z) 0.004 % SOLN OPHTHALMIC SOLUTION    Place 1 drop into both eyes daily.   Modified Medications   No medications on file  Discontinued Medications   DIVALPROEX (DEPAKOTE) 250 MG DR TABLET    Take 250 mg by mouth. Take one tablet twice a day     Physical Exam:  There were no vitals filed for this visit.  Physical Exam  Constitutional: No distress.  Eyes: Conjunctivae and EOM are normal. Pupils are equal, round, and reactive to light. Right eye exhibits no discharge. Left eye exhibits no discharge.  Neck: No JVD present.  Cardiovascular: Normal rate.   No murmur heard. Irregular, bilat trace edema  Pulmonary/Chest: Effort normal. No respiratory distress. She has no wheezes. She has rales (bilateral, right >left).  Abdominal: Soft. Bowel sounds are normal. She exhibits no distension. There is no tenderness.  Musculoskeletal:  Generally weak, MAE  Neurological: She is alert.  Oriented to self only, able to f/c  Skin: Skin is warm and dry. She is not diaphoretic.  Psychiatric: Affect normal.    Wt Readings from Last 3 Encounters:  08/13/15 105 lb (47.628 kg)  07/30/15 105 lb (47.628 kg)  07/08/15 101 lb 12.8 oz (46.176 kg)     Labs reviewed/Significant Diagnostic Results:  Basic  Metabolic Panel:  Recent Labs  01/03/15 2017 02/25/15 03/26/15 08/09/15  NA 141 141 142 143  K 3.9 4.1 4.4 3.8  CL 106  --   --   --   CO2 22  --   --   --   GLUCOSE 118*  --   --   --   BUN 25* 23* 27* 23*  CREATININE 0.83 1.0 0.8 0.8  CALCIUM 9.2  --   --   --    Liver Function Tests:  Recent Labs  01/03/15 2017 02/25/15 03/26/15  AST 37 26 19  ALT 12* 15  6*  ALKPHOS 118 120 98  BILITOT 0.8  --   --   PROT 7.8  --   --   ALBUMIN 4.1  --   --     Recent Labs  01/03/15 2017  LIPASE 39   No results for input(s): AMMONIA in the last 8760 hours. CBC:  Recent Labs  01/03/15 2017 02/25/15 03/26/15 08/09/15  WBC 14.9* 7.7 7.0 7.8  NEUTROABS 12.6*  --   --   --   HGB 13.6 11.7* 11.1* 11.3*  HCT 42.4 36 34* 34*  MCV 102.4*  --   --   --   PLT 293 304 284 234   CBG: No results for input(s): GLUCAP in the last 8760 hours. TSH:  Recent Labs  08/21/14 10/06/14 02/28/15  TSH 0.04* 0.24* 1.47   A1C: Lab Results  Component Value Date   HGBA1C 5.5 12/20/2012   Lipid Panel: No results for input(s): CHOL, HDL, LDLCALC, TRIG, CHOLHDL, LDLDIRECT in the last 8760 hours.     Assessment/Plan  1. Aspiration pneumonia of right lower lobe, unspecified aspiration pneumonia type ([place]) -continue avelox to complete 7 days, appears in stable condition without fever, sob, or purulent sputum -has a hx of dysphagia and currently on a modified diet with a waiver per family request -will have ST to eval for any further diet changes, however, she did have episodes of vomiting prior to the pna which may have precipitated her condition  2. Weakness -most likely due to #1, and recent GI illness -will recheck BMP to ensure resident is not dehydrated -due to recent dependency on [facility] resident will need physical therapy eval  3. Dementia with behavioral disturbance -has had periods of agitation in the past with hallucinations requiring seroquel and depakote -now with weakness and  probable progression in dementia, would decrease seroquel to 25 mg qhs and depakote to 125 mg qhs (hold for excess sedation)   Cindi Carbon, [place] [phone]

## 2015-08-17 LAB — BASIC METABOLIC PANEL
BUN: 28 mg/dL — AB (ref 4–21)
CREATININE: 0.8 mg/dL (ref 0.5–1.1)
GLUCOSE: 100 mg/dL
POTASSIUM: 4.1 mmol/L (ref 3.4–5.3)
Sodium: 143 mmol/L (ref 137–147)

## 2015-08-20 ENCOUNTER — Encounter: Payer: Self-pay | Admitting: Adult Health

## 2015-09-19 ENCOUNTER — Non-Acute Institutional Stay: Payer: Medicare PPO | Admitting: Adult Health

## 2015-09-19 DIAGNOSIS — S22000D Wedge compression fracture of unspecified thoracic vertebra, subsequent encounter for fracture with routine healing: Secondary | ICD-10-CM

## 2015-09-19 DIAGNOSIS — F0391 Unspecified dementia with behavioral disturbance: Secondary | ICD-10-CM | POA: Diagnosis not present

## 2015-09-19 DIAGNOSIS — F03918 Unspecified dementia, unspecified severity, with other behavioral disturbance: Secondary | ICD-10-CM

## 2015-09-19 DIAGNOSIS — I480 Paroxysmal atrial fibrillation: Secondary | ICD-10-CM | POA: Diagnosis not present

## 2015-09-20 ENCOUNTER — Encounter: Payer: Self-pay | Admitting: Adult Health

## 2015-09-20 DIAGNOSIS — S22000A Wedge compression fracture of unspecified thoracic vertebra, initial encounter for closed fracture: Secondary | ICD-10-CM | POA: Insufficient documentation

## 2015-09-20 NOTE — Progress Notes (Signed)
Patient ID: Barbara [name], female   DOB: 06/28/1929, 82 y.o.   MRN: [id number]    Nursing Home Location:  [place]   Code Status: DNR  Patient Care Team: Gayland Curry, DO as PCP - General (Geriatric Medicine) Well [place], DO (Geriatric Medicine) Royal Hawthorn, NP as Nurse Practitioner (Nurse Practitioner)  Goals of care: Advanced Directive information Advanced Directives 07/30/2015  Does patient have an advance directive? Yes  Type of Paramedic of [place];Out of facility DNR (pink MOST or yellow form)  Does patient want to make changes to advanced directive? -  Copy of advanced directive(s) in chart? -  Pre-existing out of facility DNR order (yellow form or pink MOST form) Yellow form placed in chart (order not valid for inpatient use)     Place of Service: ALF (13)  Chief Complaint  Patient presents with  . Acute Visit    f/u demenia with behaviors, fall with thoracic fx    HPI:  82 y.o. female residing at [facility], [place] status, memory care section. I am here to f/u regarding her dementia with behaviors, thoracic compression fx, and afib.  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 She has periods of agitation with delusions but those episodes are less frequent now. Depakote was discontinued in Jan due to excess sedation.  Caregiver denies increased in agitation. Remains on namenda and seroquel.   Afib: Dilacor decreased to 120 mg in jan due to low BP, rate remains controlled. No longer on anticoagulation due to falls. She has a hx of CVA but no new episodes.  Thoracic compression fx (anterior noted at T9): s/p fall on 1/24.  Resident is on Butrans patch and denies pain at this time     Review of Systems:  Review of Systems  Constitutional: Negative for  fever, chills, diaphoresis, activity change, appetite change and fatigue.  Respiratory: Negative for cough and shortness of breath.   Cardiovascular: Negative for chest pain, palpitations and leg swelling.  Musculoskeletal: Positive for gait problem. Negative for back pain and arthralgias.  Neurological: Negative for dizziness, tremors, facial asymmetry, speech difficulty, weakness and numbness.    Medications: Patient's Medications  New Prescriptions   No medications on file  Previous Medications   ACETAMINOPHEN (TYLENOL) 325 MG TABLET    Take 650 mg by mouth 3 (three) times daily.    ALBUTEROL (ACCUNEB) 1.25 MG/3ML NEBULIZER SOLUTION    Take 1 ampule by nebulization every 6 (six) hours as needed for wheezing.   ASPIRIN EC 81 MG TABLET    Take 81 mg by mouth daily.   ATORVASTATIN (LIPITOR) 10 MG TABLET    Take 10 mg by mouth daily at 6 PM. Take one tablet daily   BUPRENORPHINE (BUTRANS) 5 MCG/HR PTWK PATCH    Place 5 mcg onto the skin once a week.   CHOLECALCIFEROL (VITAMIN D) 1000 UNITS TABLET    Take 2,000 Units by mouth daily.   DILTIAZEM (DILACOR XR) 120 MG 24 HR CAPSULE    Take 120 mg by mouth daily.   DORZOLAMIDE-TIMOLOL (COSOPT) 22.3-6.8 MG/ML OPHTHALMIC SOLUTION    Place 1 drop into both eyes 2 (two) times daily.   DULOXETINE (CYMBALTA) 30 MG CAPSULE    Take 30 mg by mouth 2 (two) times daily.   FUROSEMIDE (LASIX) 20 MG TABLET    TAKE ONE TABLET AS  NEEDED FOR WEIGHT GAIN OVERNIGHT OF 2 TO 3 LBS DUE TO EDEMA   HYDROCORTISONE 2.5 % CREAM    Apply 1 application topically daily as needed (rash).   LACTOSE FREE NUTRITION (BOOST) LIQD    Take 237 mLs by mouth. Twice daily split into  Oz servings   LEVOTHYROXINE (SYNTHROID, LEVOTHROID) 75 MCG TABLET    Take 75 mcg by mouth daily before breakfast.   LORAZEPAM (ATIVAN) 0.5 MG TABLET    Take 0.5 tablets by mouth daily as needed. agitation   MEMANTINE (NAMENDA XR) 14 MG CP24 24 HR CAPSULE    Take 14 mg by mouth. Take one daily for memory    METOPROLOL (LOPRESSOR) 50 MG TABLET    Take 50 mg by mouth twice daily if SBP > 100. If SBP is consistently below 100, notify MD @@[facility]  Cardiology for further orders   MIRTAZAPINE (REMERON) 30 MG TABLET    Take 1 tablet (30 mg total) by mouth at bedtime.   NITROGLYCERIN (NITROSTAT) 0.4 MG SL TABLET    Place 0.4 mg under the tongue every 5 (five) minutes as needed for chest pain (x 3 pills daily).    OMEPRAZOLE (PRILOSEC) 20 MG CAPSULE    Take 20 mg by mouth daily.    ONDANSETRON (ZOFRAN ODT) 4 MG DISINTEGRATING TABLET    Take 1 tablet (4 mg total) by mouth every 8 (eight) hours as needed for nausea or vomiting.   POLYETHYLENE GLYCOL (MIRALAX / GLYCOLAX) PACKET    Take 17 g by mouth daily. Mix with 6oz beverage of choice. Hold for loose stool   QUETIAPINE (SEROQUEL) 50 MG TABLET    Take 50 mg by mouth at bedtime. Take 1/2 tab qhs, hold for excess sedation   TRAVOPROST, BAK FREE, (TRAVATAN Z) 0.004 % SOLN OPHTHALMIC SOLUTION    Place 1 drop into both eyes daily.   Modified Medications   No medications on file  Discontinued Medications   DILTIAZEM (CARDIZEM CD) 240 MG 24 HR CAPSULE    Take 1 capsule (240 mg total) by mouth daily.   DIVALPROEX (DEPAKOTE) 125 MG DR TABLET    Take 125 mg by mouth at bedtime. Hold for excess sedation   MONTELUKAST (SINGULAIR) 10 MG TABLET    Take 10 mg by mouth at bedtime.   MULTIPLE VITAMINS-MINERALS (PRESERVISION/LUTEIN) CAPS    Take 2 capsules by mouth daily.     Physical Exam:  Filed Vitals:   09/20/15 1004  BP: 138/88  Pulse: 81  Temp: 96.7 F (35.9 C)  Resp: 18  Weight: 102 lb (46.267 kg)    Physical Exam  Constitutional: No distress.  frail  HENT:  Head: Normocephalic and atraumatic.  Neck: Normal range of motion. Neck supple. No JVD present.  Cardiovascular:  No murmur heard. Irregular, no edema  Pulmonary/Chest: Effort normal and breath sounds normal. No respiratory distress.  Abdominal: Soft. Bowel sounds are normal. She exhibits no  distension.  Musculoskeletal: She exhibits no edema or tenderness (neg spine).  No radiculopathy, no deformity of spine, strength 4/5  Neurological: She is alert.  Oriented to self, able to answer q's and f/c  Skin: Skin is warm and dry. She is not diaphoretic.  Psychiatric: Affect normal.    Wt Readings from Last 3 Encounters:  09/20/15 102 lb (46.267 kg)  08/13/15 105 lb (47.628 kg)  07/30/15 105 lb (47.628 kg)     Labs reviewed/Significant Diagnostic Results:  Basic Metabolic Panel:  Recent Labs  01/03/15 2017  03/26/15 08/09/15 08/17/15  NA 141  < > 142 143 143  K 3.9  < > 4.4 3.8 4.1  CL 106  --   --   --   --   CO2 22  --   --   --   --   GLUCOSE 118*  --   --   --   --   BUN 25*  < > 27* 23* 28*  CREATININE 0.83  < > 0.8 0.8 0.8  CALCIUM 9.2  --   --   --   --   < > = values in this interval not displayed. Liver Function Tests:  Recent Labs  01/03/15 2017 02/25/15 03/26/15  AST 37 26 19  ALT 12* 15 6*  ALKPHOS 118 120 98  BILITOT 0.8  --   --   PROT 7.8  --   --   ALBUMIN 4.1  --   --     Recent Labs  01/03/15 2017  LIPASE 39   No results for input(s): AMMONIA in the last 8760 hours. CBC:  Recent Labs  01/03/15 2017 02/25/15 03/26/15 08/09/15  WBC 14.9* 7.7 7.0 7.8  NEUTROABS 12.6*  --   --   --   HGB 13.6 11.7* 11.1* 11.3*  HCT 42.4 36 34* 34*  MCV 102.4*  --   --   --   PLT 293 304 284 234   CBG: No results for input(s): GLUCAP in the last 8760 hours. TSH:  Recent Labs  10/06/14 02/28/15  TSH 0.24* 1.47   A1C: Lab Results  Component Value Date   HGBA1C 5.5 12/20/2012   Lipid Panel: No results for input(s): CHOL, HDL, LDLCALC, TRIG, CHOLHDL, LDLDIRECT in the last 8760 hours.     Assessment/Plan  There are no diagnoses linked to this encounter.    Labs/tests ordered    Cindi Carbon, [place] [phone]

## 2015-09-26 ENCOUNTER — Non-Acute Institutional Stay: Payer: Medicare PPO | Admitting: Adult Health

## 2015-09-26 ENCOUNTER — Encounter: Payer: Self-pay | Admitting: Adult Health

## 2015-09-26 DIAGNOSIS — R634 Abnormal weight loss: Secondary | ICD-10-CM | POA: Diagnosis not present

## 2015-09-26 DIAGNOSIS — R2681 Unsteadiness on feet: Secondary | ICD-10-CM | POA: Diagnosis not present

## 2015-09-26 DIAGNOSIS — S22000D Wedge compression fracture of unspecified thoracic vertebra, subsequent encounter for fracture with routine healing: Secondary | ICD-10-CM | POA: Diagnosis not present

## 2015-09-26 DIAGNOSIS — K5901 Slow transit constipation: Secondary | ICD-10-CM

## 2015-09-26 NOTE — Progress Notes (Signed)
Patient ID: Lavisha Chesbrough, female   DOB: [birth year], 82 y.o.   MRN: [id number] Patient ID: Nhung Neiderhiser, female   DOB: Dec 16, 1928, 82 y.o.   MRN: [id number]    Nursing Home Location:  [place]   Code Status: DNR  Patient Care Team: Gayland Curry, DO as PCP - General (Geriatric Medicine) Well Blunt, DO (Geriatric Medicine) Royal Hawthorn, NP as Nurse Practitioner (Nurse Practitioner)  Goals of care: Advanced Directive information Advanced Directives 07/30/2015  Does patient have an advance directive? Yes  Type of Paramedic of [place];Out of facility DNR (pink MOST or yellow form)  Does patient want to make changes to advanced directive? -  Copy of advanced directive(s) in chart? -  Pre-existing out of facility DNR order (yellow form or pink MOST form) Yellow form placed in chart (order not valid for inpatient use)     Place of Service: ALF (13)  Chief Complaint  Patient presents with  . Acute Visit    decreased appetite    HPI:  82 y.o. female residing at [facility], [place] status, memory care section. I was asked to see her today for reports of decreased intake and gait instability. She fell on 1/24 and was found to have a thoracic compression fx (anterior noted at T9) and was started on a butrans patch. Her pain improved significantly but since it was started she has had a significant decline in intake and has lost 5 lbs. She also has had issues with constipation and has required 2 supp in the past few days.  Her gait has been unstable and is no longer able to participate in her walking program. She denies any cough, sob, cp, abd pain, nausea, or vomiting.     Review of Systems:  Review of Systems  Constitutional: Positive for activity change, appetite change and unexpected weight change. Negative for fever, chills, diaphoresis and fatigue.  HENT: Negative for  congestion.   Respiratory: Negative for cough and shortness of breath.   Cardiovascular: Negative for chest pain, palpitations and leg swelling.  Gastrointestinal: Positive for constipation. Negative for nausea, vomiting, abdominal pain, diarrhea and abdominal distention.  Genitourinary: Negative for dysuria and flank pain.  Musculoskeletal: Positive for gait problem. Negative for back pain and arthralgias.  Neurological: Positive for weakness. Negative for dizziness, tremors, facial asymmetry, speech difficulty and numbness.    Medications: Patient's Medications  New Prescriptions   No medications on file  Previous Medications   ACETAMINOPHEN (TYLENOL) 325 MG TABLET    Take 650 mg by mouth 3 (three) times daily.    ALBUTEROL (ACCUNEB) 1.25 MG/3ML NEBULIZER SOLUTION    Take 1 ampule by nebulization every 6 (six) hours as needed for wheezing.   ASPIRIN EC 81 MG TABLET    Take 81 mg by mouth daily.   ATORVASTATIN (LIPITOR) 10 MG TABLET    Take 10 mg by mouth daily at 6 PM. Take one tablet daily   BUPRENORPHINE (BUTRANS) 5 MCG/HR PTWK PATCH    Place 5 mcg onto the skin once a week.   CHOLECALCIFEROL (VITAMIN D) 1000 UNITS TABLET    Take 2,000 Units by mouth daily.   DILTIAZEM (DILACOR XR) 120 MG 24 HR CAPSULE    Take 120 mg by mouth daily.   DORZOLAMIDE-TIMOLOL (COSOPT) 22.3-6.8 MG/ML OPHTHALMIC SOLUTION    Place 1 drop into both eyes 2 (two) times daily.   DULOXETINE (CYMBALTA) 30 MG CAPSULE  Take 30 mg by mouth 2 (two) times daily.   FUROSEMIDE (LASIX) 20 MG TABLET    TAKE ONE TABLET AS NEEDED FOR WEIGHT GAIN OVERNIGHT OF 2 TO 3 LBS DUE TO EDEMA   HYDROCORTISONE 2.5 % CREAM    Apply 1 application topically daily as needed (rash).   LACTOSE FREE NUTRITION (BOOST) LIQD    Take 237 mLs by mouth. Twice daily split into  Oz servings   LEVOTHYROXINE (SYNTHROID, LEVOTHROID) 75 MCG TABLET    Take 75 mcg by mouth daily before breakfast.   LORAZEPAM (ATIVAN) 0.5 MG TABLET    Take 0.5 tablets by  mouth daily as needed. agitation   MEMANTINE (NAMENDA XR) 14 MG CP24 24 HR CAPSULE    Take 14 mg by mouth. Take one daily for memory   METOPROLOL (LOPRESSOR) 50 MG TABLET    Take 50 mg by mouth twice daily if SBP > 100. If SBP is consistently below 100, notify MD @@[facility]  Cardiology for further orders   MIRTAZAPINE (REMERON) 30 MG TABLET    Take 1 tablet (30 mg total) by mouth at bedtime.   NITROGLYCERIN (NITROSTAT) 0.4 MG SL TABLET    Place 0.4 mg under the tongue every 5 (five) minutes as needed for chest pain (x 3 pills daily).    OMEPRAZOLE (PRILOSEC) 20 MG CAPSULE    Take 20 mg by mouth daily.    ONDANSETRON (ZOFRAN ODT) 4 MG DISINTEGRATING TABLET    Take 1 tablet (4 mg total) by mouth every 8 (eight) hours as needed for nausea or vomiting.   POLYETHYLENE GLYCOL (MIRALAX / GLYCOLAX) PACKET    Take 17 g by mouth daily. Mix with 6oz beverage of choice. Hold for loose stool   QUETIAPINE (SEROQUEL) 50 MG TABLET    Take 50 mg by mouth at bedtime. Take 1/2 tab qhs, hold for excess sedation   SENNOSIDES-DOCUSATE SODIUM (SENOKOT-S) 8.6-50 MG TABLET    Take 2 tablets by mouth daily.   TRAVOPROST, BAK FREE, (TRAVATAN Z) 0.004 % SOLN OPHTHALMIC SOLUTION    Place 1 drop into both eyes daily.   Modified Medications   No medications on file  Discontinued Medications   No medications on file     Physical Exam:  Filed Vitals:   09/26/15 1544  BP: 108/64  Pulse: 91  Temp: 96.7 F (35.9 C)  Resp: 18  Weight: 100 lb (45.36 kg)  SpO2: 94%    Physical Exam  Constitutional: No distress.  frail  HENT:  Head: Normocephalic and atraumatic.  Neck: Normal range of motion. Neck supple. No JVD present.  Cardiovascular:  No murmur heard. Irregular, no edema  Pulmonary/Chest: Effort normal and breath sounds normal. No respiratory distress.  Abdominal: Soft. Bowel sounds are normal. She exhibits no distension.  Musculoskeletal: She exhibits no edema or tenderness (neg spine).  No radiculopathy, no  deformity of spine, strength 4/5  Neurological: She is alert.  Oriented to self, able to answer q's and f/c  Skin: Skin is warm and dry. She is not diaphoretic.  Psychiatric: Affect normal.    Wt Readings from Last 3 Encounters:  09/26/15 100 lb (45.36 kg)  09/20/15 102 lb (46.267 kg)  08/13/15 105 lb (47.628 kg)     Labs reviewed/Significant Diagnostic Results:  Basic Metabolic Panel:  Recent Labs  01/03/15 2017  03/26/15 08/09/15 08/17/15  NA 141  < > 142 143 143  K 3.9  < > 4.4 3.8 4.1  CL 106  --   --   --   --  CO2 22  --   --   --   --   GLUCOSE 118*  --   --   --   --   BUN 25*  < > 27* 23* 28*  CREATININE 0.83  < > 0.8 0.8 0.8  CALCIUM 9.2  --   --   --   --   < > = values in this interval not displayed. Liver Function Tests:  Recent Labs  01/03/15 2017 02/25/15 03/26/15  AST 37 26 19  ALT 12* 15 6*  ALKPHOS 118 120 98  BILITOT 0.8  --   --   PROT 7.8  --   --   ALBUMIN 4.1  --   --     Recent Labs  01/03/15 2017  LIPASE 39   No results for input(s): AMMONIA in the last 8760 hours. CBC:  Recent Labs  01/03/15 2017 02/25/15 03/26/15 08/09/15  WBC 14.9* 7.7 7.0 7.8  NEUTROABS 12.6*  --   --   --   HGB 13.6 11.7* 11.1* 11.3*  HCT 42.4 36 34* 34*  MCV 102.4*  --   --   --   PLT 293 304 284 234   CBG: No results for input(s): GLUCAP in the last 8760 hours. TSH:  Recent Labs  10/06/14 02/28/15  TSH 0.24* 1.47   A1C: Lab Results  Component Value Date   HGBA1C 5.5 12/20/2012   Lipid Panel: No results for input(s): CHOL, HDL, LDLCALC, TRIG, CHOLHDL, LDLDIRECT in the last 8760 hours.     Assessment/Plan  1. Loss of weight -continue to monitor weights and encourage nutritional supplement -consider decreased appetite due to constipation and butrans patch  2. Thoracic compression fracture, with routine healing, subsequent encounter -will d/c butrans due to gait instability, decreased appetite, weight loss, constipation, and difficulty  obtaining ins coverage -try ultram 50 mg q6 hr prn pain -if no improvement in the above issues the resident's POA Kim, is willing to pay out of pocket for the med   3. Gait instability -has completed therapy and has a walking program in place -possibly due to weakness from progressive dementia, consider drug effect as well -no signs of CVA but is at risk now that she is no longer on anticoagulation (due to falls)  4. Constipation -add senna-s two tabs at bedtime,hold for loose stools  Cindi Carbon, [place] [phone]

## 2015-11-29 ENCOUNTER — Non-Acute Institutional Stay: Payer: Medicare PPO | Admitting: Adult Health

## 2015-11-29 DIAGNOSIS — I5032 Chronic diastolic (congestive) heart failure: Secondary | ICD-10-CM

## 2015-11-29 DIAGNOSIS — R05 Cough: Secondary | ICD-10-CM | POA: Diagnosis not present

## 2015-11-29 DIAGNOSIS — R059 Cough, unspecified: Secondary | ICD-10-CM

## 2015-12-03 ENCOUNTER — Non-Acute Institutional Stay (SKILLED_NURSING_FACILITY): Payer: Medicare PPO | Admitting: Internal Medicine

## 2015-12-03 ENCOUNTER — Encounter: Payer: Self-pay | Admitting: Internal Medicine

## 2015-12-03 DIAGNOSIS — I480 Paroxysmal atrial fibrillation: Secondary | ICD-10-CM

## 2015-12-03 DIAGNOSIS — F0391 Unspecified dementia with behavioral disturbance: Secondary | ICD-10-CM | POA: Diagnosis not present

## 2015-12-03 DIAGNOSIS — F411 Generalized anxiety disorder: Secondary | ICD-10-CM | POA: Diagnosis not present

## 2015-12-03 DIAGNOSIS — E039 Hypothyroidism, unspecified: Secondary | ICD-10-CM

## 2015-12-03 DIAGNOSIS — S22000D Wedge compression fracture of unspecified thoracic vertebra, subsequent encounter for fracture with routine healing: Secondary | ICD-10-CM | POA: Diagnosis not present

## 2015-12-03 DIAGNOSIS — R490 Dysphonia: Secondary | ICD-10-CM | POA: Diagnosis not present

## 2015-12-03 DIAGNOSIS — M810 Age-related osteoporosis without current pathological fracture: Secondary | ICD-10-CM | POA: Diagnosis not present

## 2015-12-03 DIAGNOSIS — Z9181 History of falling: Secondary | ICD-10-CM

## 2015-12-03 DIAGNOSIS — I10 Essential (primary) hypertension: Secondary | ICD-10-CM

## 2015-12-03 DIAGNOSIS — F03918 Unspecified dementia, unspecified severity, with other behavioral disturbance: Secondary | ICD-10-CM

## 2015-12-03 NOTE — Progress Notes (Signed)
Patient ID: Barbara [name], female   DOB: Dec 31, 1928, 82 y.o.   MRN: [id number]  Location:  [facility] Room Number: 310 Place of Service:  SNF (31) Provider:  Rayden Scheper L. Mariea Clonts, D.O., C.M.D.  Hollace Kinnier, DO  Patient Care Team: Gayland Curry, DO as PCP - General (Geriatric Medicine) Well [place], DO (Geriatric Medicine) Royal Hawthorn, NP as Nurse Practitioner (Nurse Practitioner)  Extended Emergency Contact Information Primary Emergency Contact: Cindy Hazy of [place] Phone: [phone] Mobile Phone: [phone] Relation: Daughter Secondary Emergency Contact: Brantley Fling [place] of [place] Phone: [phone] Relation: Daughter  Code Status:  DNR Goals of care: Advanced Directive information Advanced Directives 01/02/2016  Does patient have an advance directive? Yes  Type of Paramedic of [place];Living will;Out of facility DNR (pink MOST or yellow form)  Does patient want to make changes to advanced directive? Yes - information given  Copy of advanced directive(s) in chart? Yes  Pre-existing out of facility DNR order (yellow form or pink MOST form) Pink MOST form placed in chart (order not valid for inpatient use);Yellow form placed in chart (order not valid for inpatient use)     Chief Complaint  Patient presents with  . Medical Management of Chronic Issues    routine visit    HPI:  Pt is a 82 y.o. female seen today for medical management of chronic diseases.  Her caregiver is with her in the activity area of memory care.  As I was observing her, she'd been bending over toward her feet again and again like she was reaching for something when nothing was there. She denied any complaints of pain or discomfort.  She has fallen again since last time.  Her visual loss continues to progress.  She stopped bending over when  distracted to eat some chocolate chip cookies.  Staff report no concerns with bms.  She had lost considerable weight as a result of her progressing dementia. She's been having more difficulty also with her swallowing--has had a wet cough and hoarseness and ST is getting insurance authorization so MBS can be considered or FEES to evaluate further.  She also has COPD.  She's on ativan 0.5mg  po q 6 hrs prn for her fidgeting and agitated behaviors that seem to lead to falls.    Past Medical History  Diagnosis Date  . Dysphagia     pharyngoesophageal phase  . Diarrhea   . Chronic atrial fibrillation ([place])   . Cervicalgia   . Low back pain   . Hemorrhage of anus and rectum   . Iron deficiency anemia due to chronic blood loss   . Anemia   . Cerebral infarction due to embolism of precerebral artery ([place])   . Gait abnormality   . Glaucoma, open angle   . Hypertension   . Long term current use of anticoagulant   . Anxiety disorder   . Mixed hyperlipidemia   . Hypothyroidism   . Dementia   . Macular degeneration   . Age related osteoporosis   . Atrial fibrillation ([place]) 2009    Long term anticoagulation. Warfarin changed to Xarelto 06/2012  . Cystocele, midline   . Senile dementia, uncomplicated   . Generalized anxiety disorder   . Unspecified glaucoma   . Macular degeneration (senile) of retina, unspecified   . Osteoarthrosis, unspecified whether generalized or localized, unspecified site   .  Senile osteoporosis   . Pneumonia, organism unspecified 03/2012    Tx w/ PO antibx  . Unspecified vitamin D deficiency   . Unspecified venous (peripheral) insufficiency   . Tear film insufficiency, unspecified 08/2012  . Debility, unspecified 04/01/2012  . Long term (current) use of anticoagulants 04/01/2012  . CVA (cerebral infarction) 11/19/2012  . Anemia 11/2012    transfusion 11/2012  . Unspecified hypothyroidism   . Other and unspecified hyperlipidemia     Atorvastatin started 12/2012  . Benign  paroxysmal positional vertigo 11/08/2013  . Glaucoma   . Stroke ([place]) 2014  . Contusion, chest wall 11/27/2013    Left side.   Marland Kitchen History of fall 11/27/2013  . Wound of right leg 11/13/2013    posteriorly    Past Surgical History  Procedure Laterality Date  . Tonsillectomy and adenoidectomy    . Umbilical hernia repair    . Cataract extraction w/ intraocular lens  implant, bilateral    . Spine surgery      RE; Spinal stenosis  . Esophagogastroduodenoscopy (egd) with propofol N/A 11/21/2013    Procedure: ESOPHAGOGASTRODUODENOSCOPY (EGD) WITH PROPOFOL;  Surgeon: Cleotis Nipper, MD;  Location: [facility] ENDOSCOPY;  Service: Endoscopy;  Laterality: N/A;  . Colonoscopy with propofol N/A 11/21/2013    Procedure: COLONOSCOPY WITH PROPOFOL;  Surgeon: Cleotis Nipper, MD;  Location: [facility] ENDOSCOPY;  Service: Endoscopy;  Laterality: N/A;    Allergies  Allergen Reactions  . Aricept [Donepezil Hcl] Other (See Comments)    Muscle pain      Medication List       This list is accurate as of: 12/03/15 11:59 PM.  Always use your most recent med list.               acetaminophen 325 MG tablet  Commonly known as:  TYLENOL  Take 650 mg by mouth 3 (three) times daily.     albuterol 1.25 MG/3ML nebulizer solution  Commonly known as:  ACCUNEB  Take 1 ampule by nebulization every 6 (six) hours as needed for wheezing.     aspirin EC 81 MG tablet  Take 81 mg by mouth daily.     atorvastatin 10 MG tablet  Commonly known as:  LIPITOR  Take 10 mg by mouth daily at 6 PM. Take one tablet daily     cholecalciferol 1000 units tablet  Commonly known as:  VITAMIN D  Take 2,000 Units by mouth daily.     DEPAKOTE SPRINKLES 125 MG capsule  Generic drug:  divalproex  Take 125 mg by mouth daily.     diltiazem 120 MG 24 hr capsule  Commonly known as:  DILACOR XR  Take 120 mg by mouth daily.     dorzolamide-timolol 22.3-6.8 MG/ML ophthalmic solution  Commonly known as:  COSOPT  Place 1 drop into both eyes 2  (two) times daily.     DULoxetine 30 MG capsule  Commonly known as:  CYMBALTA  Take 30 mg by mouth 2 (two) times daily.     DUONEB 0.5-2.5 (3) MG/3ML Soln  Generic drug:  ipratropium-albuterol  Take 3 mLs by nebulization 3 (three) times daily.     furosemide 40 MG tablet  Commonly known as:  LASIX  Take 40 mg by mouth daily as needed for edema.     hydrocortisone 2.5 % cream  Apply 1 application topically daily as needed (rash).     lactose free nutrition Liqd  Take 237 mLs by mouth 2 (two) times daily between  meals.     levothyroxine 75 MCG tablet  Commonly known as:  SYNTHROID, LEVOTHROID  Take 75 mcg by mouth daily before breakfast.     LORazepam 0.5 MG tablet  Commonly known as:  ATIVAN  Take 0.5 tablets by mouth daily as needed. agitation     metoprolol 50 MG tablet  Commonly known as:  LOPRESSOR  Take 50 mg by mouth twice daily if SBP > 100. If SBP is consistently below 100, notify MD @@[facility]  Cardiology for further orders     mirtazapine 30 MG tablet  Commonly known as:  REMERON  Take 1 tablet (30 mg total) by mouth at bedtime.     NAMENDA XR 14 MG Cp24 24 hr capsule  Generic drug:  memantine  Take 14 mg by mouth daily.     nitroGLYCERIN 0.4 MG SL tablet  Commonly known as:  NITROSTAT  Place 0.4 mg under the tongue every 5 (five) minutes as needed for chest pain (x 3 pills daily).     omeprazole 20 MG capsule  Commonly known as:  PRILOSEC  Take 20 mg by mouth daily.     ondansetron 4 MG disintegrating tablet  Commonly known as:  ZOFRAN ODT  Take 1 tablet (4 mg total) by mouth every 8 (eight) hours as needed for nausea or vomiting.     polyethylene glycol packet  Commonly known as:  MIRALAX / GLYCOLAX  Take 17 g by mouth daily. Mix with 6oz beverage of choice. Hold for loose stool     potassium chloride SA 20 MEQ tablet  Commonly known as:  K-DUR,KLOR-CON  Take 20 mEq by mouth daily.     sennosides-docusate sodium 8.6-50 MG tablet  Commonly known as:   SENOKOT-S  Take 2 tablets by mouth daily.     traMADol 50 MG tablet  Commonly known as:  ULTRAM  Take 50 mg by mouth every 6 (six) hours as needed.     TRAVATAN Z 0.004 % Soln ophthalmic solution  Generic drug:  Travoprost (BAK Free)  Place 1 drop into both eyes daily.       Review of Systems  Constitutional: Negative for fever and chills.  HENT: Positive for hearing loss and rhinorrhea.   Eyes: Positive for visual disturbance.       Visual loss  Respiratory: Positive for cough and wheezing. Negative for choking, shortness of breath and stridor.        Wet cough, hoarseness  Cardiovascular: Positive for leg swelling. Negative for chest pain.       Ok with TEDS  Gastrointestinal: Negative for abdominal pain, blood in stool and abdominal distention.  Genitourinary: Negative for dysuria.  Musculoskeletal: Positive for gait problem.       Unsteady gait, to use walker at all times and supported by caregiver   Neurological: Positive for weakness. Negative for dizziness.  Psychiatric/Behavioral: Positive for hallucinations, behavioral problems, confusion, decreased concentration and agitation. Negative for suicidal ideas and sleep disturbance. The patient is nervous/anxious.     Immunization History  Administered Date(s) Administered  . Influenza Whole 04/29/2012  . Influenza-Unspecified 05/24/2013, 05/15/2014, 05/23/2015  . Pneumococcal Polysaccharide-23 01/31/2009  . Td 11/29/2001  . Tdap 07/19/2011  . Zoster 01/10/2002, 01/09/2006   Pertinent  Health Maintenance Due  Topic Date Due  . DEXA SCAN  05/27/1996  . PNA vac Low Risk Adult (2 of 2 - PCV13) 01/31/2010  . INFLUENZA VACCINE  03/10/2016   Fall Risk  08/13/2015  Falls in the past  year? Yes  Number falls in past yr: 2 or more  Injury with Fall? Yes  Risk Factor Category  High Fall Risk  Risk for fall due to : History of fall(s)  Follow up Falls evaluation completed   Functional Status Survey: Is the patient deaf or  have difficulty hearing?: No Does the patient have difficulty seeing, even when wearing glasses/contacts?: Yes Does the patient have difficulty concentrating, remembering, or making decisions?: Yes Does the patient have difficulty walking or climbing stairs?: Yes Does the patient have difficulty dressing or bathing?: Yes Does the patient have difficulty doing errands alone such as visiting a doctor's office or shopping?: Yes  Filed Vitals:   12/03/15 1451  BP: 119/91  Pulse: 99  Temp: 97 F (36.1 C)  TempSrc: Oral  Resp: 16  Weight: 103 lb (46.72 kg)  SpO2: 95%   Body mass index is 20.79 kg/(m^2). Physical Exam  Constitutional: No distress.  HENT:  Mouth/Throat: No oropharyngeal exudate.  hoarse  Cardiovascular: Normal rate, regular rhythm, normal heart sounds and intact distal pulses.   Pulmonary/Chest: Effort normal. No respiratory distress. She has wheezes.  Abdominal: Soft. Bowel sounds are normal.  Musculoskeletal: Normal range of motion.  kyphosis  Neurological: She is alert.  Difficulty focusing on questions at hand  Skin: Skin is warm and dry.  Psychiatric:  Pleasant, responded to questions asked    Labs reviewed:  Recent Labs  03/26/15 08/09/15 08/17/15  NA 142 143 143  K 4.4 3.8 4.1  BUN 27* 23* 28*  CREATININE 0.8 0.8 0.8    Recent Labs  02/25/15 03/26/15  AST 26 19  ALT 15 6*  ALKPHOS 120 98    Recent Labs  02/25/15 03/26/15 08/09/15  WBC 7.7 7.0 7.8  HGB 11.7* 11.1* 11.3*  HCT 36 34* 34*  PLT 304 284 234   Lab Results  Component Value Date   TSH 1.47 02/28/2015   Lab Results  Component Value Date   HGBA1C 5.5 12/20/2012   Lab Results  Component Value Date   CHOL 120 07/26/2014   HDL 40 07/26/2014   LDLCALC 63 07/26/2014   TRIG 85 07/26/2014   CHOLHDL 3.5 12/20/2012    Assessment/Plan 1. Dementia with behavioral disturbance -advanced at this point, is in memory care and getting help with all adls -falls frequently even with  close supervision due to very unsteady gait and visual loss  2. Senile osteoporosis -continues on vitamin D, I see no bone density reports in her epic chart--last one listed is 1997--should really have had this when she was doing better--need to check paper records  3. Thoracic compression fracture, with routine healing, subsequent encounter -with chronic pain -cont regular tylenol and prn tramadol for pain -may be contributing to her decision to bend over in her chair so far  4. Paroxysmal atrial fibrillation ([facility]) -on baby asa, diltiazem, metoprolol--question if all needed but HR does remain 99 on vitals from today so will leave alone at this time -falls too often for other anticoagulation (previously discontinued)  5. Essential hypertension -bp is ok--diastolic slightly elevated with machine today -need manual bps after these machine readings are not normal  6. Hypothyroidism, unspecified hypothyroidism type -last tsh in epic in July, should have at least 2x/yr  while stable  7. History of fall -multifactorial, cont fall precautions that are in place and supervision  8. Generalized anxiety disorder -continues on ativan, has been a lifelong condition I am told  9. Hoarseness -seems she  is developing dysphagia and aspirating  -await ST eval and MBS/FEES assessment if needed   Family/ staff Communication: discussed with caregiver and nursing staff  Labs/tests ordered:  Should have TSH if not done in past 6 mos

## 2015-12-10 ENCOUNTER — Encounter: Payer: Self-pay | Admitting: Adult Health

## 2015-12-10 NOTE — Progress Notes (Addendum)
Patient ID: Barbara [name], female   DOB: Jun 26, 1929, 82 y.o.   MRN: [id number]  Location:  [place] of Service:  ALF (13) Provider:   Cindi Carbon, ANP [place] [phone]   REED, Jonelle Sidle, DO  Patient Care Team: Gayland Curry, DO as PCP - General (Geriatric Medicine) Well [place], DO (Geriatric Medicine) Royal Hawthorn, NP as Nurse Practitioner (Nurse Practitioner)  Extended Emergency Contact Information Primary Emergency Contact: Cindy Hazy of [place] Phone: [phone] Mobile Phone: [phone] Relation: Daughter Secondary Emergency Contact: Brantley Fling [place] of [place] Phone: [phone] Relation: Daughter  Code Status:  DNR Goals of care: Advanced Directive information Advanced Directives 12/03/2015  Does patient have an advance directive? Yes  Type of Advance Directive Out of facility DNR (pink MOST or yellow form);Healthcare Power of Attorney  Copy of advanced directive(s) in chart? Yes  Pre-existing out of facility DNR order (yellow form or pink MOST form) Yellow form placed in chart (order not valid for inpatient use)     Chief Complaint  Patient presents with  . Acute Visit    cough    HPI:  Pt is a 82 y.o. female seen today for an acute visit for cough present for 2-3 days. No fever or purulent sputum but has a congested sound to cough per staff. The resident has a hx of dementia and is unable to contribute to the hx.  Her caregiver denies any increased work of breathing. Staff reports her weight is up 5 lbs.  She has a prn order for 40 mg of lasix and this was given.  She has a hx of afib, rate controlled at 84.  BP was elevated prior to meds this am 142/100, decreased to 112/82 after am meds and lasix. Hx of diastolic CHF  Echo [id number] Impressions:  - Normal LV function; proximal septal thickening; sclerotic  aortic  valve with mild AI; severe MAC with mild MR; severe LAE; mild  RAE; moderate TR; mildly elevated pulmonary pressure.   Past Medical History  Diagnosis Date  . Dysphagia     pharyngoesophageal phase  . Diarrhea   . Chronic atrial fibrillation ([place])   . Cervicalgia   . Low back pain   . Hemorrhage of anus and rectum   . Iron deficiency anemia due to chronic blood loss   . Anemia   . Cerebral infarction due to embolism of precerebral artery ([place])   . Gait abnormality   . Glaucoma, open angle   . Hypertension   . Long term current use of anticoagulant   . Anxiety disorder   . Mixed hyperlipidemia   . Hypothyroidism   . Dementia   . Macular degeneration   . Age related osteoporosis   . Atrial fibrillation ([place]) 2009    Long term anticoagulation. Warfarin changed to Xarelto 06/2012  . Cystocele, midline   . Senile dementia, uncomplicated   . Generalized anxiety disorder   . Unspecified glaucoma   . Macular degeneration (senile) of retina, unspecified   . Osteoarthrosis, unspecified whether generalized or localized, unspecified site   . Senile osteoporosis   . Pneumonia, organism unspecified 03/2012    Tx w/ PO antibx  . Unspecified vitamin D deficiency   . Unspecified venous (peripheral) insufficiency   . Tear film insufficiency, unspecified 08/2012  . Debility, unspecified 04/01/2012  . Long term (current) use  of anticoagulants 04/01/2012  . CVA (cerebral infarction) 11/19/2012  . Anemia 11/2012    transfusion 11/2012  . Unspecified hypothyroidism   . Other and unspecified hyperlipidemia     Atorvastatin started 12/2012  . Benign paroxysmal positional vertigo 11/08/2013  . Glaucoma   . Stroke ([place]) 2014  . Contusion, chest wall 11/27/2013    Left side.   Marland Kitchen History of fall 11/27/2013  . Wound of right leg 11/13/2013    posteriorly    Past Surgical History  Procedure Laterality Date  . Tonsillectomy and adenoidectomy    . Umbilical hernia repair    . Cataract  extraction w/ intraocular lens  implant, bilateral    . Spine surgery      RE; Spinal stenosis  . Esophagogastroduodenoscopy (egd) with propofol N/A 11/21/2013    Procedure: ESOPHAGOGASTRODUODENOSCOPY (EGD) WITH PROPOFOL;  Surgeon: Cleotis Nipper, MD;  Location: [facility] ENDOSCOPY;  Service: Endoscopy;  Laterality: N/A;  . Colonoscopy with propofol N/A 11/21/2013    Procedure: COLONOSCOPY WITH PROPOFOL;  Surgeon: Cleotis Nipper, MD;  Location: [facility] ENDOSCOPY;  Service: Endoscopy;  Laterality: N/A;    Allergies  Allergen Reactions  . Aricept [Donepezil Hcl] Other (See Comments)    Muscle pain      Medication List       This list is accurate as of: 11/29/15 11:59 PM.  Always use your most recent med list.               acetaminophen 325 MG tablet  Commonly known as:  TYLENOL  Take 650 mg by mouth 3 (three) times daily.     albuterol 1.25 MG/3ML nebulizer solution  Commonly known as:  ACCUNEB  Take 1 ampule by nebulization every 6 (six) hours as needed for wheezing.     aspirin EC 81 MG tablet  Take 81 mg by mouth daily.     atorvastatin 10 MG tablet  Commonly known as:  LIPITOR  Take 10 mg by mouth daily at 6 PM. Take one tablet daily     BUTRANS 5 MCG/HR Ptwk patch  Generic drug:  buprenorphine  Place 5 mcg onto the skin once a week.     cholecalciferol 1000 units tablet  Commonly known as:  VITAMIN D  Take 2,000 Units by mouth daily.     diltiazem 120 MG 24 hr capsule  Commonly known as:  DILACOR XR  Take 120 mg by mouth daily.     dorzolamide-timolol 22.3-6.8 MG/ML ophthalmic solution  Commonly known as:  COSOPT  Place 1 drop into both eyes 2 (two) times daily.     DULoxetine 30 MG capsule  Commonly known as:  CYMBALTA  Take 30 mg by mouth 2 (two) times daily.     furosemide 20 MG tablet  Commonly known as:  LASIX  TAKE ONE TABLET AS NEEDED FOR WEIGHT GAIN OVERNIGHT OF 2 TO 3 LBS DUE TO EDEMA     hydrocortisone 2.5 % cream  Apply 1 application topically  daily as needed (rash).     lactose free nutrition Liqd  Take 237 mLs by mouth 2 (two) times daily between meals.     levothyroxine 75 MCG tablet  Commonly known as:  SYNTHROID, LEVOTHROID  Take 75 mcg by mouth daily before breakfast.     LORazepam 0.5 MG tablet  Commonly known as:  ATIVAN  Take 0.5 tablets by mouth daily as needed. agitation     metoprolol 50 MG tablet  Commonly known as:  LOPRESSOR  Take 50 mg by mouth twice daily if SBP > 100. If SBP is consistently below 100, notify MD @@[facility]  Cardiology for further orders     mirtazapine 30 MG tablet  Commonly known as:  REMERON  Take 1 tablet (30 mg total) by mouth at bedtime.     NAMENDA XR 14 MG Cp24 24 hr capsule  Generic drug:  memantine  Take 14 mg by mouth daily.     nitroGLYCERIN 0.4 MG SL tablet  Commonly known as:  NITROSTAT  Place 0.4 mg under the tongue every 5 (five) minutes as needed for chest pain (x 3 pills daily).     omeprazole 20 MG capsule  Commonly known as:  PRILOSEC  Take 20 mg by mouth daily.     ondansetron 4 MG disintegrating tablet  Commonly known as:  ZOFRAN ODT  Take 1 tablet (4 mg total) by mouth every 8 (eight) hours as needed for nausea or vomiting.     polyethylene glycol packet  Commonly known as:  MIRALAX / GLYCOLAX  Take 17 g by mouth daily. Mix with 6oz beverage of choice. Hold for loose stool     QUEtiapine 50 MG tablet  Commonly known as:  SEROQUEL  Take 50 mg by mouth at bedtime. Take 1/2 tab qhs, hold for excess sedation     sennosides-docusate sodium 8.6-50 MG tablet  Commonly known as:  SENOKOT-S  Take 2 tablets by mouth daily.     TRAVATAN Z 0.004 % Soln ophthalmic solution  Generic drug:  Travoprost (BAK Free)  Place 1 drop into both eyes daily.        Review of Systems  Immunization History  Administered Date(s) Administered  . Influenza Whole 04/29/2012  . Influenza-Unspecified 05/24/2013, 05/15/2014, 05/23/2015  . Pneumococcal Polysaccharide-23  01/31/2009  . Td 11/29/2001  . Tdap 07/19/2011  . Zoster 01/10/2002, 01/09/2006   Pertinent  Health Maintenance Due  Topic Date Due  . DEXA SCAN  05/27/1996  . PNA vac Low Risk Adult (2 of 2 - PCV13) 01/31/2010  . INFLUENZA VACCINE  03/10/2016   Fall Risk  08/13/2015  Falls in the past year? Yes  Number falls in past yr: 2 or more  Injury with Fall? Yes  Risk Factor Category  High Fall Risk  Risk for fall due to : History of fall(s)  Follow up Falls evaluation completed   Functional Status Survey:    Filed Vitals:   12/10/15 1632  BP: 112/82  Pulse: 84  Temp: 97.4 F (36.3 C)  Resp: 12  Weight: 103 lb (46.72 kg)  SpO2: 92%   Body mass index is 20.79 kg/(m^2). Physical Exam  Constitutional: No distress.  HENT:  Head: Normocephalic and atraumatic.  Right Ear: External ear normal.  Left Ear: External ear normal.  Nose: Nose normal.  Mouth/Throat: Oropharynx is clear and moist. No oropharyngeal exudate.  Eyes: Conjunctivae are normal. Right eye exhibits no discharge. Left eye exhibits no discharge.  Cardiovascular: Normal rate.   No murmur heard. Irregular with BLE edema trace  Pulmonary/Chest: Effort normal. She has rales (bibasilar).  Abdominal: Soft. Bowel sounds are normal. She exhibits no distension.  Lymphadenopathy:    She has no cervical adenopathy.  Neurological: She is alert.  Oriented to self only  Skin: Skin is warm and dry. She is not diaphoretic.  Psychiatric: She has a normal mood and affect.    Labs reviewed:  Recent Labs  01/03/15 2017  03/26/15 08/09/15 08/17/15  NA  141  < > 142 143 143  K 3.9  < > 4.4 3.8 4.1  CL 106  --   --   --   --   CO2 22  --   --   --   --   GLUCOSE 118*  --   --   --   --   BUN 25*  < > 27* 23* 28*  CREATININE 0.83  < > 0.8 0.8 0.8  CALCIUM 9.2  --   --   --   --   < > = values in this interval not displayed.  Recent Labs  01/03/15 2017 02/25/15 03/26/15  AST 37 26 19  ALT 12* 15 6*  ALKPHOS 118 120 98    BILITOT 0.8  --   --   PROT 7.8  --   --   ALBUMIN 4.1  --   --     Recent Labs  01/03/15 2017 02/25/15 03/26/15 08/09/15  WBC 14.9* 7.7 7.0 7.8  NEUTROABS 12.6*  --   --   --   HGB 13.6 11.7* 11.1* 11.3*  HCT 42.4 36 34* 34*  MCV 102.4*  --   --   --   PLT 293 304 284 234   Lab Results  Component Value Date   TSH 1.47 02/28/2015   Lab Results  Component Value Date   HGBA1C 5.5 12/20/2012   Lab Results  Component Value Date   CHOL 120 07/26/2014   HDL 40 07/26/2014   LDLCALC 63 07/26/2014   TRIG 85 07/26/2014   CHOLHDL 3.5 12/20/2012    Significant Diagnostic Results in last 30 days:  No results found.  Assessment/Plan 1. Cough -present for 2-3 days -Duoneb TID for 5 days -check CXR to rule out pna (risk for aspiration due to dysphagia)  2. Chronic diastolic congestive heart failure (HCC) -noted weight gain of 5 lbs per staff with cough but no obvious sob (residented demented and unable to express herself well) -given 40 mg of lasix -will review CXR and if pattern of CHF noted given another 40 mg of lasix -monitor weight and BMP    Family/ staff Communication: discussed with staff   Labs/tests ordered:  CXR

## 2016-01-01 ENCOUNTER — Non-Acute Institutional Stay: Payer: Medicare PPO | Admitting: Internal Medicine

## 2016-01-01 ENCOUNTER — Encounter: Payer: Self-pay | Admitting: Internal Medicine

## 2016-01-01 DIAGNOSIS — F0391 Unspecified dementia with behavioral disturbance: Secondary | ICD-10-CM

## 2016-01-01 DIAGNOSIS — S22000D Wedge compression fracture of unspecified thoracic vertebra, subsequent encounter for fracture with routine healing: Secondary | ICD-10-CM | POA: Diagnosis not present

## 2016-01-01 DIAGNOSIS — J9809 Other diseases of bronchus, not elsewhere classified: Secondary | ICD-10-CM | POA: Diagnosis not present

## 2016-01-01 DIAGNOSIS — R627 Adult failure to thrive: Secondary | ICD-10-CM

## 2016-01-01 DIAGNOSIS — J189 Pneumonia, unspecified organism: Secondary | ICD-10-CM

## 2016-01-01 DIAGNOSIS — T17500A Unspecified foreign body in bronchus causing asphyxiation, initial encounter: Secondary | ICD-10-CM

## 2016-01-01 DIAGNOSIS — F03918 Unspecified dementia, unspecified severity, with other behavioral disturbance: Secondary | ICD-10-CM

## 2016-01-01 DIAGNOSIS — I5032 Chronic diastolic (congestive) heart failure: Secondary | ICD-10-CM

## 2016-01-01 NOTE — Progress Notes (Signed)
Location:  [facility] of Service:  ALF (13) Provider:  Zira Helinski L. Mariea Clonts, D.O., C.M.D.  Hollace Kinnier, DO  Patient Care Team: Gayland Curry, DO as PCP - General (Geriatric Medicine) Well [place], DO (Geriatric Medicine) Royal Hawthorn, NP as Nurse Practitioner (Nurse Practitioner)  Extended Emergency Contact Information Primary Emergency Contact: Cindy Hazy of [place] Phone: [phone] Mobile Phone: [phone] Relation: Daughter Secondary Emergency Contact: Brantley Fling [place] of [place] Phone: [phone] Relation: Daughter  Code Status:  DNR Goals of care: Advanced Directive information Advanced Directives 12/03/2015  Does patient have an advance directive? Yes  Type of Advance Directive Out of facility DNR (pink MOST or yellow form);Healthcare Power of Attorney  Copy of advanced directive(s) in chart? Yes  Pre-existing out of facility DNR order (yellow form or pink MOST form) Yellow form placed in chart (order not valid for inpatient use)   Chief Complaint  Patient presents with  . Acute Visit    increased work of breathing, lethargy after chest xray obtained    HPI:  Pt is a 82 y.o. female with advanced mixed dementia with behaviors, frequent falls, depression and anxiety, CHF seen today for an acute visit for increased work of breathing and lethargy after CXR obtain for concerns initially for volume overload.  She'd been treated with lasix due to weight gain.  Unfortunately, when cxr obtained, pt suddenly became lethargic and respiratory rate increased to 30s.    Nursing came in clinic to ask me to see pt immediately.  She was breathing in the 30s, occasionally coughing, and not arousable initially.  Suction was requested which initially was not helpful.  We started an IV with NS and levaquin 500mg  IV asap.  I spoke with her daughter and requested  she come back from the beach and we discussed avoiding hospitalization and treating her mom here at [facility] due to her advanced dementia and risks of delirium.  I was concerned pt may not survive.  She was given nebulizer treatments, as well. I ordered roxanol, ativan and scopolamine for comfort.  Her breathing calmed some, she was suctioned again due to visible secretions in her oral passage, she then coughed aggressively and this seemed to break loose a mucus plug as her respirations improved further and she gradually became arousable and began speaking again with staff.  We decided to continue the levaquin orally in am if pt was able to swallow.  Her daughter had not yet arrived when the day was over, but pt had stabilized.    Past Medical History  Diagnosis Date  . Dysphagia     pharyngoesophageal phase  . Diarrhea   . Chronic atrial fibrillation ([place])   . Cervicalgia   . Low back pain   . Hemorrhage of anus and rectum   . Iron deficiency anemia due to chronic blood loss   . Anemia   . Cerebral infarction due to embolism of precerebral artery ([place])   . Gait abnormality   . Glaucoma, open angle   . Hypertension   . Long term current use of anticoagulant   . Anxiety disorder   . Mixed hyperlipidemia   . Hypothyroidism   . Dementia   . Macular degeneration   . Age related osteoporosis   . Atrial fibrillation ([place]) 2009    Long term anticoagulation. Warfarin changed to Xarelto 06/2012  . Cystocele, midline   .  Senile dementia, uncomplicated   . Generalized anxiety disorder   . Unspecified glaucoma   . Macular degeneration (senile) of retina, unspecified   . Osteoarthrosis, unspecified whether generalized or localized, unspecified site   . Senile osteoporosis   . Pneumonia, organism unspecified 03/2012    Tx w/ PO antibx  . Unspecified vitamin D deficiency   . Unspecified venous (peripheral) insufficiency   . Tear film insufficiency, unspecified 08/2012  . Debility, unspecified  04/01/2012  . Long term (current) use of anticoagulants 04/01/2012  . CVA (cerebral infarction) 11/19/2012  . Anemia 11/2012    transfusion 11/2012  . Unspecified hypothyroidism   . Other and unspecified hyperlipidemia     Atorvastatin started 12/2012  . Benign paroxysmal positional vertigo 11/08/2013  . Glaucoma   . Stroke ([place]) 2014  . Contusion, chest wall 11/27/2013    Left side.   Marland Kitchen History of fall 11/27/2013  . Wound of right leg 11/13/2013    posteriorly    Past Surgical History  Procedure Laterality Date  . Tonsillectomy and adenoidectomy    . Umbilical hernia repair    . Cataract extraction w/ intraocular lens  implant, bilateral    . Spine surgery      RE; Spinal stenosis  . Esophagogastroduodenoscopy (egd) with propofol N/A 11/21/2013    Procedure: ESOPHAGOGASTRODUODENOSCOPY (EGD) WITH PROPOFOL;  Surgeon: Cleotis Nipper, MD;  Location: [facility] ENDOSCOPY;  Service: Endoscopy;  Laterality: N/A;  . Colonoscopy with propofol N/A 11/21/2013    Procedure: COLONOSCOPY WITH PROPOFOL;  Surgeon: Cleotis Nipper, MD;  Location: [facility] ENDOSCOPY;  Service: Endoscopy;  Laterality: N/A;    Allergies  Allergen Reactions  . Aricept [Donepezil Hcl] Other (See Comments)    Muscle pain      Medication List       This list is accurate as of: 01/01/16  1:44 PM.  Always use your most recent med list.               acetaminophen 325 MG tablet  Commonly known as:  TYLENOL  Take 650 mg by mouth 3 (three) times daily.     albuterol 1.25 MG/3ML nebulizer solution  Commonly known as:  ACCUNEB  Take 1 ampule by nebulization every 6 (six) hours as needed for wheezing.     aspirin EC 81 MG tablet  Take 81 mg by mouth daily.     atorvastatin 10 MG tablet  Commonly known as:  LIPITOR  Take 10 mg by mouth daily at 6 PM. Take one tablet daily     cholecalciferol 1000 units tablet  Commonly known as:  VITAMIN D  Take 2,000 Units by mouth daily.     DEPAKOTE SPRINKLES 125 MG capsule  Generic drug:   divalproex  Take 125 mg by mouth daily.     diltiazem 120 MG 24 hr capsule  Commonly known as:  DILACOR XR  Take 120 mg by mouth daily.     dorzolamide-timolol 22.3-6.8 MG/ML ophthalmic solution  Commonly known as:  COSOPT  Place 1 drop into both eyes 2 (two) times daily.     DULoxetine 30 MG capsule  Commonly known as:  CYMBALTA  Take 30 mg by mouth 2 (two) times daily.     DUONEB 0.5-2.5 (3) MG/3ML Soln  Generic drug:  ipratropium-albuterol  Take 3 mLs by nebulization 3 (three) times daily.     furosemide 40 MG tablet  Commonly known as:  LASIX  Take 40 mg by mouth daily as needed  for edema.     hydrocortisone 2.5 % cream  Apply 1 application topically daily as needed (rash).     lactose free nutrition Liqd  Take 237 mLs by mouth 2 (two) times daily between meals.     levothyroxine 75 MCG tablet  Commonly known as:  SYNTHROID, LEVOTHROID  Take 75 mcg by mouth daily before breakfast.     LORazepam 0.5 MG tablet  Commonly known as:  ATIVAN  Take 0.5 tablets by mouth daily as needed. agitation     metoprolol 50 MG tablet  Commonly known as:  LOPRESSOR  Take 50 mg by mouth twice daily if SBP > 100. If SBP is consistently below 100, notify MD @@[facility]  Cardiology for further orders     mirtazapine 30 MG tablet  Commonly known as:  REMERON  Take 1 tablet (30 mg total) by mouth at bedtime.     NAMENDA XR 14 MG Cp24 24 hr capsule  Generic drug:  memantine  Take 14 mg by mouth daily.     nitroGLYCERIN 0.4 MG SL tablet  Commonly known as:  NITROSTAT  Place 0.4 mg under the tongue every 5 (five) minutes as needed for chest pain (x 3 pills daily).     omeprazole 20 MG capsule  Commonly known as:  PRILOSEC  Take 20 mg by mouth daily.     ondansetron 4 MG disintegrating tablet  Commonly known as:  ZOFRAN ODT  Take 1 tablet (4 mg total) by mouth every 8 (eight) hours as needed for nausea or vomiting.     polyethylene glycol packet  Commonly known as:  MIRALAX / GLYCOLAX   Take 17 g by mouth daily. Mix with 6oz beverage of choice. Hold for loose stool     potassium chloride SA 20 MEQ tablet  Commonly known as:  K-DUR,KLOR-CON  Take 20 mEq by mouth daily.     sennosides-docusate sodium 8.6-50 MG tablet  Commonly known as:  SENOKOT-S  Take 2 tablets by mouth daily.     traMADol 50 MG tablet  Commonly known as:  ULTRAM  Take 50 mg by mouth every 6 (six) hours as needed.     TRAVATAN Z 0.004 % Soln ophthalmic solution  Generic drug:  Travoprost (BAK Free)  Place 1 drop into both eyes daily.        Review of Systems  Unable to perform ROS: patient unresponsive    Immunization History  Administered Date(s) Administered  . Influenza Whole 04/29/2012  . Influenza-Unspecified 05/24/2013, 05/15/2014, 05/23/2015  . Pneumococcal Polysaccharide-23 01/31/2009  . Td 11/29/2001  . Tdap 07/19/2011  . Zoster 01/10/2002, 01/09/2006   Pertinent  Health Maintenance Due  Topic Date Due  . DEXA SCAN  05/27/1996  . PNA vac Low Risk Adult (2 of 2 - PCV13) 01/31/2010  . INFLUENZA VACCINE  03/10/2016   Fall Risk  08/13/2015  Falls in the past year? Yes  Number falls in past yr: 2 or more  Injury with Fall? Yes  Risk Factor Category  High Fall Risk  Risk for fall due to : History of fall(s)  Follow up Falls evaluation completed   There were no vitals filed for this visit. There is no weight on file to calculate BMI. Physical Exam  Labs reviewed:  Recent Labs  01/03/15 2017  03/26/15 08/09/15 08/17/15  NA 141  < > 142 143 143  K 3.9  < > 4.4 3.8 4.1  CL 106  --   --   --   --  CO2 22  --   --   --   --   GLUCOSE 118*  --   --   --   --   BUN 25*  < > 27* 23* 28*  CREATININE 0.83  < > 0.8 0.8 0.8  CALCIUM 9.2  --   --   --   --   < > = values in this interval not displayed.  Recent Labs  01/03/15 2017 02/25/15 03/26/15  AST 37 26 19  ALT 12* 15 6*  ALKPHOS 118 120 98  BILITOT 0.8  --   --   PROT 7.8  --   --   ALBUMIN 4.1  --   --      Recent Labs  01/03/15 2017 02/25/15 03/26/15 08/09/15  WBC 14.9* 7.7 7.0 7.8  NEUTROABS 12.6*  --   --   --   HGB 13.6 11.7* 11.1* 11.3*  HCT 42.4 36 34* 34*  MCV 102.4*  --   --   --   PLT 293 304 284 234   Lab Results  Component Value Date   TSH 1.47 02/28/2015   Lab Results  Component Value Date   HGBA1C 5.5 12/20/2012   Lab Results  Component Value Date   CHOL 120 07/26/2014   HDL 40 07/26/2014   LDLCALC 63 07/26/2014   TRIG 85 07/26/2014   CHOLHDL 3.5 12/20/2012    Significant Diagnostic Results in last 30 days:  CXR:  Chronic findings, no acute infiltrate or effusion.  Assessment/Plan 1. Mucus plugging of bronchi -suspect this was due to aspiration  -suction performed with mild improvement in work of breathing -spoke with her daughter who was agreeable to treating her here rather than transfer to the hospital with her advanced dementia and gradual decline overall -will treat with levaquin and fluids   2. HCAP (healthcare-associated pneumonia) -suspect aspiration -given one dose of levaquin 500mg  IV and one liter fluids NS at 70cc/hr -comfort meds added with roxanol and ativan and scopolamine due to increased work of breathing and end of life   3. Chronic diastolic congestive heart failure (HCC) -was actually being treated for volume overload with increased lasix to bid with recent weight gain; however, this did not resolve shortness of breath and congestion -cxr obtained negative  4. Dementia with behavioral disturbance -advanced with FTT  5. Thoracic compression fracture, with routine healing, subsequent encounter -has had increased pain due to this recently -roxanol added to help  6. Adult failure to thrive -due to advanced dementia -comfort measures, but treating infection at present and avoiding hospitalization  Family/ staff Communication: discussed with dtr [name] who was going to come back from the beach  Labs/tests ordered:  CXR  Magalie Almon  L. Nautica Hotz, D.O. [facility] [address], [place] [address] Cell Phone (Mon-Fri 8am-5pm):  [phone] On Call:  [phone] & follow prompts after 5pm & weekends Office Phone:  [phone] Office Fax:  [phone]

## 2016-01-02 ENCOUNTER — Encounter: Payer: Self-pay | Admitting: Adult Health

## 2016-01-02 ENCOUNTER — Non-Acute Institutional Stay: Payer: Medicare PPO | Admitting: Adult Health

## 2016-01-02 DIAGNOSIS — R0600 Dyspnea, unspecified: Secondary | ICD-10-CM

## 2016-01-02 DIAGNOSIS — R509 Fever, unspecified: Secondary | ICD-10-CM

## 2016-01-02 DIAGNOSIS — Z515 Encounter for palliative care: Secondary | ICD-10-CM | POA: Diagnosis not present

## 2016-01-02 NOTE — Progress Notes (Signed)
Patient ID: Barbara [name], female   DOB: 1929-05-15, 82 y.o.   MRN: [id number]  Location:    [facility]   Place of Service:   ALF Provider:   Cindi Carbon, ANP [facility] [phone]  Hollace Kinnier, DO  Patient Care Team: Gayland Curry, DO as PCP - General (Geriatric Medicine) [place], DO (Geriatric Medicine) Royal Hawthorn, NP as Nurse Practitioner (Nurse Practitioner)  Extended Emergency Contact Information Primary Emergency Contact: Cindy Hazy of [place] Phone: [phone] Mobile Phone: [phone] Relation: Daughter Secondary Emergency Contact: Brantley Fling [place] of [place] Phone: [phone] Relation: Daughter  Code Status:  DNR Goals of care: Advanced Directive information Advanced Directives 12/03/2015  Does patient have an advance directive? Yes  Type of Advance Directive Out of facility DNR (pink MOST or yellow form);Healthcare Power of Attorney  Copy of advanced directive(s) in chart? Yes  Pre-existing out of facility DNR order (yellow form or pink MOST form) Yellow form placed in chart (order not valid for inpatient use)     Chief Complaint  Patient presents with  . Acute Visit    sob, most form    HPI:  Pt is a 82 y.o. female seen today for an acute visit for follow up after an episode of mucus plugging, aspiration, and severe change in LOC, WOB, and hypoxia on 5/24.  She has a hx of dementia and CVA's with aspiration.  The episode occurred around lunch time and given her symptoms there was a presumed aspiration event. She was given IVF, and one dose of levaquin. She was also given roxanol last night for dyspnea. Since then she has been sleeping with no increase wob, has not needed oxygen, but has had a temp of 100.6.  Her family is here to discuss her care and discuss end of life issues.    Past Medical History  Diagnosis Date  . Dysphagia      pharyngoesophageal phase  . Diarrhea   . Chronic atrial fibrillation ([place])   . Cervicalgia   . Low back pain   . Hemorrhage of anus and rectum   . Iron deficiency anemia due to chronic blood loss   . Anemia   . Cerebral infarction due to embolism of precerebral artery ([place])   . Gait abnormality   . Glaucoma, open angle   . Hypertension   . Long term current use of anticoagulant   . Anxiety disorder   . Mixed hyperlipidemia   . Hypothyroidism   . Dementia   . Macular degeneration   . Age related osteoporosis   . Atrial fibrillation ([place]) 2009    Long term anticoagulation. Warfarin changed to Xarelto 06/2012  . Cystocele, midline   . Senile dementia, uncomplicated   . Generalized anxiety disorder   . Unspecified glaucoma   . Macular degeneration (senile) of retina, unspecified   . Osteoarthrosis, unspecified whether generalized or localized, unspecified site   . Senile osteoporosis   . Pneumonia, organism unspecified 03/2012    Tx w/ PO antibx  . Unspecified vitamin D deficiency   . Unspecified venous (peripheral) insufficiency   . Tear film insufficiency, unspecified 08/2012  . Debility, unspecified 04/01/2012  . Long term (current) use of anticoagulants 04/01/2012  . CVA (cerebral infarction) 11/19/2012  . Anemia 11/2012    transfusion 11/2012  . Unspecified hypothyroidism   . Other and unspecified hyperlipidemia  Atorvastatin started 12/2012  . Benign paroxysmal positional vertigo 11/08/2013  . Glaucoma   . Stroke ([place]) 2014  . Contusion, chest wall 11/27/2013    Left side.   Marland Kitchen History of fall 11/27/2013  . Wound of right leg 11/13/2013    posteriorly    Past Surgical History  Procedure Laterality Date  . Tonsillectomy and adenoidectomy    . Umbilical hernia repair    . Cataract extraction w/ intraocular lens  implant, bilateral    . Spine surgery      RE; Spinal stenosis  . Esophagogastroduodenoscopy (egd) with propofol N/A 11/21/2013    Procedure:  ESOPHAGOGASTRODUODENOSCOPY (EGD) WITH PROPOFOL;  Surgeon: Cleotis Nipper, MD;  Location: [facility] ENDOSCOPY;  Service: Endoscopy;  Laterality: N/A;  . Colonoscopy with propofol N/A 11/21/2013    Procedure: COLONOSCOPY WITH PROPOFOL;  Surgeon: Cleotis Nipper, MD;  Location: [facility] ENDOSCOPY;  Service: Endoscopy;  Laterality: N/A;    Allergies  Allergen Reactions  . Aricept [Donepezil Hcl] Other (See Comments)    Muscle pain      Medication List       This list is accurate as of: 01/02/16 11:52 AM.  Always use your most recent med list.               acetaminophen 325 MG tablet  Commonly known as:  TYLENOL  Take 650 mg by mouth 3 (three) times daily.     albuterol 1.25 MG/3ML nebulizer solution  Commonly known as:  ACCUNEB  Take 1 ampule by nebulization every 6 (six) hours as needed for wheezing.     aspirin EC 81 MG tablet  Take 81 mg by mouth daily.     atorvastatin 10 MG tablet  Commonly known as:  LIPITOR  Take 10 mg by mouth daily at 6 PM. Take one tablet daily     cholecalciferol 1000 units tablet  Commonly known as:  VITAMIN D  Take 2,000 Units by mouth daily.     DEPAKOTE SPRINKLES 125 MG capsule  Generic drug:  divalproex  Take 125 mg by mouth daily.     diltiazem 120 MG 24 hr capsule  Commonly known as:  DILACOR XR  Take 120 mg by mouth daily.     dorzolamide-timolol 22.3-6.8 MG/ML ophthalmic solution  Commonly known as:  COSOPT  Place 1 drop into both eyes 2 (two) times daily.     DULoxetine 30 MG capsule  Commonly known as:  CYMBALTA  Take 30 mg by mouth 2 (two) times daily.     DUONEB 0.5-2.5 (3) MG/3ML Soln  Generic drug:  ipratropium-albuterol  Take 3 mLs by nebulization 3 (three) times daily.     furosemide 40 MG tablet  Commonly known as:  LASIX  Take 40 mg by mouth daily as needed for edema.     hydrocortisone 2.5 % cream  Apply 1 application topically daily as needed (rash).     lactose free nutrition Liqd  Take 237 mLs by mouth 2 (two)  times daily between meals.     levothyroxine 75 MCG tablet  Commonly known as:  SYNTHROID, LEVOTHROID  Take 75 mcg by mouth daily before breakfast.     LORazepam 0.5 MG tablet  Commonly known as:  ATIVAN  Take 0.5 tablets by mouth daily as needed. agitation     metoprolol 50 MG tablet  Commonly known as:  LOPRESSOR  Take 50 mg by mouth twice daily if SBP > 100. If SBP is consistently below 100,  notify MD @@[facility]  Cardiology for further orders     mirtazapine 30 MG tablet  Commonly known as:  REMERON  Take 1 tablet (30 mg total) by mouth at bedtime.     NAMENDA XR 14 MG Cp24 24 hr capsule  Generic drug:  memantine  Take 14 mg by mouth daily.     nitroGLYCERIN 0.4 MG SL tablet  Commonly known as:  NITROSTAT  Place 0.4 mg under the tongue every 5 (five) minutes as needed for chest pain (x 3 pills daily).     omeprazole 20 MG capsule  Commonly known as:  PRILOSEC  Take 20 mg by mouth daily.     ondansetron 4 MG disintegrating tablet  Commonly known as:  ZOFRAN ODT  Take 1 tablet (4 mg total) by mouth every 8 (eight) hours as needed for nausea or vomiting.     polyethylene glycol packet  Commonly known as:  MIRALAX / GLYCOLAX  Take 17 g by mouth daily. Mix with 6oz beverage of choice. Hold for loose stool     potassium chloride SA 20 MEQ tablet  Commonly known as:  K-DUR,KLOR-CON  Take 20 mEq by mouth daily.     sennosides-docusate sodium 8.6-50 MG tablet  Commonly known as:  SENOKOT-S  Take 2 tablets by mouth daily.     traMADol 50 MG tablet  Commonly known as:  ULTRAM  Take 50 mg by mouth every 6 (six) hours as needed.     TRAVATAN Z 0.004 % Soln ophthalmic solution  Generic drug:  Travoprost (BAK Free)  Place 1 drop into both eyes daily.        Review of Systems  Unable to perform ROS: Dementia    Immunization History  Administered Date(s) Administered  . Influenza Whole 04/29/2012  . Influenza-Unspecified 05/24/2013, 05/15/2014, 05/23/2015  . Pneumococcal  Polysaccharide-23 01/31/2009  . Td 11/29/2001  . Tdap 07/19/2011  . Zoster 01/10/2002, 01/09/2006   Pertinent  Health Maintenance Due  Topic Date Due  . DEXA SCAN  05/27/1996  . PNA vac Low Risk Adult (2 of 2 - PCV13) 01/31/2010  . INFLUENZA VACCINE  03/10/2016   Fall Risk  08/13/2015  Falls in the past year? Yes  Number falls in past yr: 2 or more  Injury with Fall? Yes  Risk Factor Category  High Fall Risk  Risk for fall due to : History of fall(s)  Follow up Falls evaluation completed   Functional Status Survey:    Filed Vitals:   01/02/16 1154  BP: 127/73  Pulse: 97  Temp: 100.6 F (38.1 C)  Resp: 18  SpO2: 93%   There is no weight on file to calculate BMI. Physical Exam  Constitutional: No distress.  HENT:  Head: Normocephalic and atraumatic.  Unable to open her mouth  Neck: No JVD present.  Cardiovascular:  No murmur heard. Irregular, rate elevated  Pulmonary/Chest: She is in respiratory distress. She has rales (bases).  Abdominal: Soft. Bowel sounds are normal.  Musculoskeletal: She exhibits no edema or tenderness.  Neurological:  Obtunded, says no to pain, but unable to follow other commands  Skin: Skin is warm. She is diaphoretic.  Psychiatric: She has a normal mood and affect.    Labs reviewed:  Recent Labs  01/03/15 2017  03/26/15 08/09/15 08/17/15  NA 141  < > 142 143 143  K 3.9  < > 4.4 3.8 4.1  CL 106  --   --   --   --  CO2 22  --   --   --   --   GLUCOSE 118*  --   --   --   --   BUN 25*  < > 27* 23* 28*  CREATININE 0.83  < > 0.8 0.8 0.8  CALCIUM 9.2  --   --   --   --   < > = values in this interval not displayed.  Recent Labs  01/03/15 2017 02/25/15 03/26/15  AST 37 26 19  ALT 12* 15 6*  ALKPHOS 118 120 98  BILITOT 0.8  --   --   PROT 7.8  --   --   ALBUMIN 4.1  --   --     Recent Labs  01/03/15 2017 02/25/15 03/26/15 08/09/15  WBC 14.9* 7.7 7.0 7.8  NEUTROABS 12.6*  --   --   --   HGB 13.6 11.7* 11.1* 11.3*  HCT 42.4  36 34* 34*  MCV 102.4*  --   --   --   PLT 293 304 284 234   Lab Results  Component Value Date   TSH 1.47 02/28/2015   Lab Results  Component Value Date   HGBA1C 5.5 12/20/2012   Lab Results  Component Value Date   CHOL 120 07/26/2014   HDL 40 07/26/2014   LDLCALC 63 07/26/2014   TRIG 85 07/26/2014   CHOLHDL 3.5 12/20/2012    Significant Diagnostic Results in last 30 days:  No results found.  Assessment/Plan  1. Dyspnea -improved with roxanol given last night. Appears comfortable now.  Continue duonebs but no further antibiotics -this event was most likely related to aspiration which is a long standing issue for her due to dementia and hx of CVA  2. Febrile illness -febrile now most likely due to aspiration -obtunded and unable to swallow at this time  3. End of life care -I had a long discussion with the Frailey family with all 3 siblings present, including her POA, Kim.  We have decided to forego any aggressive measures with the following most form implications: no hospitalizations, no antibiotics, and no tube feeding or IVF.  Ms. Regas overall quality of life has been poor with a long course of dementia with behavioral disturbance.  She has had several CVA's but is no longer able to take anticoagulation due to falls.  She is now at the end of her life and her goals of care are comfort based. I have offered the family hospice and they are considering this issue.    Cindi Carbon, ANP [facility] [phone]

## 2016-02-08 DEATH — deceased
